# Patient Record
Sex: Male | Born: 1950 | Race: Black or African American | Hispanic: No | Marital: Single | Smoking: Former smoker
Health system: Southern US, Community
[De-identification: ages and names within clinical notes are randomized; demographics above are authoritative.]

## PROBLEM LIST (undated history)

## (undated) DIAGNOSIS — Z9889 Other specified postprocedural states: Secondary | ICD-10-CM

## (undated) DIAGNOSIS — I959 Hypotension, unspecified: Secondary | ICD-10-CM

## (undated) DIAGNOSIS — N186 End stage renal disease: Secondary | ICD-10-CM

## (undated) DIAGNOSIS — I4891 Unspecified atrial fibrillation: Secondary | ICD-10-CM

## (undated) DIAGNOSIS — R609 Edema, unspecified: Secondary | ICD-10-CM

## (undated) HISTORY — DX: Other specified postprocedural states: Z98.890

## (undated) HISTORY — DX: Hypotension, unspecified: I95.9

## (undated) HISTORY — DX: End stage renal disease: N18.6

## (undated) HISTORY — DX: Unspecified atrial fibrillation: I48.91

## (undated) HISTORY — DX: Edema, unspecified: R60.9

---

## 2011-05-29 DIAGNOSIS — B1920 Unspecified viral hepatitis C without hepatic coma: Secondary | ICD-10-CM | POA: Insufficient documentation

## 2011-05-29 DIAGNOSIS — N179 Acute kidney failure, unspecified: Secondary | ICD-10-CM | POA: Insufficient documentation

## 2011-05-29 DIAGNOSIS — F1193 Opioid use, unspecified with withdrawal: Secondary | ICD-10-CM | POA: Insufficient documentation

## 2011-06-01 DIAGNOSIS — N401 Enlarged prostate with lower urinary tract symptoms: Secondary | ICD-10-CM | POA: Insufficient documentation

## 2012-04-30 DIAGNOSIS — R252 Cramp and spasm: Secondary | ICD-10-CM | POA: Insufficient documentation

## 2020-01-07 DIAGNOSIS — F1911 Other psychoactive substance abuse, in remission: Secondary | ICD-10-CM | POA: Insufficient documentation

## 2020-01-07 DIAGNOSIS — Z720 Tobacco use: Secondary | ICD-10-CM | POA: Insufficient documentation

## 2020-10-15 DIAGNOSIS — K921 Melena: Secondary | ICD-10-CM | POA: Insufficient documentation

## 2020-11-28 DIAGNOSIS — N186 End stage renal disease: Secondary | ICD-10-CM | POA: Insufficient documentation

## 2020-12-27 DIAGNOSIS — A419 Sepsis, unspecified organism: Secondary | ICD-10-CM | POA: Insufficient documentation

## 2021-01-07 DIAGNOSIS — B9689 Other specified bacterial agents as the cause of diseases classified elsewhere: Secondary | ICD-10-CM | POA: Insufficient documentation

## 2021-01-07 DIAGNOSIS — J9601 Acute respiratory failure with hypoxia: Secondary | ICD-10-CM | POA: Insufficient documentation

## 2021-01-07 DIAGNOSIS — L89210 Pressure ulcer of right hip, unstageable: Secondary | ICD-10-CM | POA: Insufficient documentation

## 2021-01-07 DIAGNOSIS — G9341 Metabolic encephalopathy: Secondary | ICD-10-CM | POA: Insufficient documentation

## 2021-03-12 DIAGNOSIS — D509 Iron deficiency anemia, unspecified: Secondary | ICD-10-CM | POA: Insufficient documentation

## 2021-03-12 DIAGNOSIS — T782XXA Anaphylactic shock, unspecified, initial encounter: Secondary | ICD-10-CM | POA: Insufficient documentation

## 2021-03-12 DIAGNOSIS — N2581 Secondary hyperparathyroidism of renal origin: Secondary | ICD-10-CM | POA: Insufficient documentation

## 2021-03-12 DIAGNOSIS — D631 Anemia in chronic kidney disease: Secondary | ICD-10-CM | POA: Insufficient documentation

## 2021-03-12 DIAGNOSIS — R0602 Shortness of breath: Secondary | ICD-10-CM

## 2021-03-12 DIAGNOSIS — E1129 Type 2 diabetes mellitus with other diabetic kidney complication: Secondary | ICD-10-CM | POA: Insufficient documentation

## 2021-03-12 DIAGNOSIS — Z111 Encounter for screening for respiratory tuberculosis: Secondary | ICD-10-CM | POA: Insufficient documentation

## 2021-03-12 DIAGNOSIS — D688 Other specified coagulation defects: Secondary | ICD-10-CM | POA: Insufficient documentation

## 2021-03-12 DIAGNOSIS — T7840XA Allergy, unspecified, initial encounter: Secondary | ICD-10-CM | POA: Insufficient documentation

## 2021-03-12 DIAGNOSIS — E1151 Type 2 diabetes mellitus with diabetic peripheral angiopathy without gangrene: Secondary | ICD-10-CM | POA: Insufficient documentation

## 2021-03-12 HISTORY — DX: Shortness of breath: R06.02

## 2021-04-12 ENCOUNTER — Other Ambulatory Visit: Payer: Self-pay

## 2021-04-12 DIAGNOSIS — N189 Chronic kidney disease, unspecified: Secondary | ICD-10-CM

## 2021-04-19 ENCOUNTER — Other Ambulatory Visit: Payer: Self-pay

## 2021-04-19 ENCOUNTER — Encounter (HOSPITAL_BASED_OUTPATIENT_CLINIC_OR_DEPARTMENT_OTHER): Payer: Medicare (Managed Care) | Attending: Internal Medicine | Admitting: Internal Medicine

## 2021-04-19 DIAGNOSIS — Z992 Dependence on renal dialysis: Secondary | ICD-10-CM | POA: Diagnosis not present

## 2021-04-19 DIAGNOSIS — N186 End stage renal disease: Secondary | ICD-10-CM | POA: Insufficient documentation

## 2021-04-19 DIAGNOSIS — L89313 Pressure ulcer of right buttock, stage 3: Secondary | ICD-10-CM | POA: Diagnosis not present

## 2021-04-19 DIAGNOSIS — L89892 Pressure ulcer of other site, stage 2: Secondary | ICD-10-CM | POA: Diagnosis present

## 2021-04-19 DIAGNOSIS — L89153 Pressure ulcer of sacral region, stage 3: Secondary | ICD-10-CM | POA: Insufficient documentation

## 2021-04-19 NOTE — Progress Notes (Signed)
MUNACHIMSO, PALIN ([id number]) Visit Report for 04/19/2021 Allergy List Details Patient Name: Date of Service: [name] ID 04/19/2021 1:15 PM Medical Record Number: [id number] Patient Account Number: [id number] Date of Birth/Sex: Treating RN: 16-Sep-1950 (71 y.o. [name] Primary Care Dashan Chizmar: Other Clinician: Referring Amiir Heckard: Treating Araiya Tilmon/Extender: Joanie Coddington in Treatment: 0 Allergies Active Allergies No Known Allergies Allergy Notes Electronic Signature(s) Signed: 04/19/2021 5:28:28 PM By: Dellie Catholic RN Entered By: Dellie Catholic on 04/19/2021 13:37:06 -------------------------------------------------------------------------------- Arrival Information Details Patient Name: Date of Service: [name], [name] ID 04/19/2021 1:15 PM Medical Record Number: [id number] Patient Account Number: [id number] Date of Birth/Sex: Treating RN: June 02, 1950 (71 y.o. [name] Primary Care Sharonda Llamas: Other Clinician: Referring Zaiah Eckerson: Treating Yassmin Binegar/Extender: Joanie Coddington in Treatment: 0 Visit Information Patient Arrived: Wheel Chair Arrival Time: 13:30 Accompanied By: daughter Transfer Assistance: Civil Service fast streamer Patient Identification Verified: Yes Secondary Verification Process Completed: Yes Patient Requires Transmission-Based Precautions: No Patient Has Alerts: No Electronic Signature(s) Signed: 04/19/2021 5:28:28 PM By: Dellie Catholic RN Entered By: Dellie Catholic on 04/19/2021 13:35:33 -------------------------------------------------------------------------------- Clinic Level of Care Assessment Details Patient Name: Date of Service: [name] ID 04/19/2021 1:15 PM Medical Record Number: [id number] Patient Account Number: [id number] Date of Birth/Sex: Treating RN: Jul 28, 1950 (71 y.o. [name] Primary Care Medora Roorda: Other Clinician: Referring Lynley Killilea: Treating Nicky Milhouse/Extender: Joanie Coddington in Treatment: 0 Clinic Level of Care Assessment Items TOOL 1 Quantity Score X- 1 0 Use when EandM and Procedure is performed on INITIAL visit ASSESSMENTS - Nursing Assessment / Reassessment X- 1 20 General Physical Exam (combine w/ comprehensive assessment (listed just below) when performed on new pt. evals) X- 1 25 Comprehensive Assessment (HX, ROS, Risk Assessments, Wounds Hx, etc.) ASSESSMENTS - Wound and Skin Assessment / Reassessment X- 1 10 Dermatologic / Skin Assessment (not related to wound area) ASSESSMENTS - Ostomy and/or Continence Assessment and Care []  - 0 Incontinence Assessment and Management []  - 0 Ostomy Care Assessment and Management (repouching, etc.) PROCESS - Coordination of Care []  - 0 Simple Patient / Family Education for ongoing care X- 1 20 Complex (extensive) Patient / Family Education for ongoing care X- 1 10 Staff obtains Programmer, systems, Records, T Results / Process Orders est []  - 0 Staff telephones HHA, Nursing Homes / Clarify orders / etc []  - 0 Routine Transfer to another Facility (non-emergent condition) []  - 0 Routine Hospital Admission (non-emergent condition) X- 1 15 New Admissions / Biomedical engineer / Ordering NPWT Apligraf, etc. , []  - 0 Emergency Hospital Admission (emergent condition) PROCESS - Special Needs []  - 0 Pediatric / Minor Patient Management []  - 0 Isolation Patient Management []  - 0 Hearing / Language / Visual special needs []  - 0 Assessment of Community assistance (transportation, D/C planning, etc.) []  - 0 Additional assistance / Altered mentation X- 1 15 Support Surface(s) Assessment (bed, cushion, seat, etc.) INTERVENTIONS - Miscellaneous []  - 0 External ear exam X- 1 10 Patient Transfer (multiple staff / Civil Service fast streamer / Similar devices) []  - 0 Simple Staple / Suture removal (25 or less) []  - 0 Complex Staple / Suture removal (26 or more) []  - 0 Hypo/Hyperglycemic  Management (do not check if billed separately) []  - 0 Ankle / Brachial Index (ABI) - do not check if billed separately Has the patient been seen at the hospital within the last three years: Yes Total Score: 125 Level Of Care: New/Established - Level 4 Electronic Signature(s) Signed: 04/19/2021  5:28:28 PM By: Dellie Catholic RN Entered By: Dellie Catholic on 04/19/2021 16:51:25 -------------------------------------------------------------------------------- Encounter Discharge Information Details Patient Name: Date of Service: [name], Shaune Pascal ID 04/19/2021 1:15 PM Medical Record Number: [id number] Patient Account Number: [id number] Date of Birth/Sex: Treating RN: 1950/04/11 (71 y.o. [name] Primary Care Kymoni Monday: Other Clinician: Referring Sire Poet: Treating Annjanette Wertenberger/Extender: Joanie Coddington in Treatment: 0 Encounter Discharge Information Items Post Procedure Vitals Discharge Condition: Stable Temperature (F): 98.3 Ambulatory Status: Wheelchair Pulse (bpm): 78 Discharge Destination: Home Respiratory Rate (breaths/min): 16 Transportation: Private Auto Blood Pressure (mmHg): 114/81 Accompanied By: Daughter Schedule Follow-up Appointment: Yes Clinical Summary of Care: Patient Declined Electronic Signature(s) Signed: 04/19/2021 5:28:28 PM By: Dellie Catholic RN Entered By: Dellie Catholic on 04/19/2021 17:12:08 -------------------------------------------------------------------------------- Lower Extremity Assessment Details Patient Name: Date of Service: [name] ID 04/19/2021 1:15 PM Medical Record Number: [id number] Patient Account Number: [id number] Date of Birth/Sex: Treating RN: 05/29/1950 (71 y.o. [name] Primary Care Meiya Wisler: Other Clinician: Referring Daneesha Quinteros: Treating Keyani Rigdon/Extender: Joanie Coddington in Treatment: 0 Electronic Signature(s) Signed: 04/19/2021 5:28:28 PM By: Dellie Catholic  RN Entered By: Dellie Catholic on 04/19/2021 14:19:23 -------------------------------------------------------------------------------- Multi Wound Chart Details Patient Name: Date of Service: [name], [name] ID 04/19/2021 1:15 PM Medical Record Number: [id number] Patient Account Number: [id number] Date of Birth/Sex: Treating RN: [birth year] (71 y.o. [name] Primary Care Len Azeez: Other Clinician: Referring Detra Bores: Treating Tamrah Victorino/Extender: Joanie Coddington in Treatment: 0 Vital Signs Height(in): 73 Pulse(bpm): 78 Weight(lbs): 147 Blood Pressure(mmHg): 114/81 Body Mass Index(BMI): 19.4 Temperature(F): 98.3 Respiratory Rate(breaths/min): 16 Photos: [2:No Photos] Sacrum Right Ischial Tuberosity Right Ischial Tuberosity Wound Location: Pressure Injury Pressure Injury Pressure Injury Wounding Event: Pressure Ulcer Pressure Ulcer Pressure Ulcer Primary Etiology: Arrhythmia Arrhythmia Arrhythmia Comorbid History: 01/26/2021 01/26/2021 01/26/2021 Date Acquired: 0 0 0 Weeks of Treatment: Open Open Open Wound Status: No No No Wound Recurrence: 2.5x1.6x0.8 11.7x6x0.2 11.7x6x0.2 Measurements L x W x D (cm) 3.142 55.135 55.135 A (cm) : rea 2.513 11.027 11.027 Volume (cm) : 0.00% N/A 0.00% % Reduction in A rea: 0.00% N/A 0.00% % Reduction in Volume: Category/Stage III Category/Stage III Category/Stage III Classification: Medium Medium Medium Exudate A mount: Serosanguineous Serosanguineous Serosanguineous Exudate Type: red, brown red, brown red, brown Exudate Color: Medium (34-66%) Large (67-100%) Large (67-100%) Granulation A mount: N/A Red N/A Granulation Quality: Medium (34-66%) Small (1-33%) Small (1-33%) Necrotic A mount: Fat Layer (Subcutaneous Tissue): Yes Fat Layer (Subcutaneous Tissue): Yes Fat Layer (Subcutaneous Tissue): Yes Exposed Structures: Fascia: No Fascia: No Fascia: No Tendon: No Tendon: No Tendon:  No Muscle: No Muscle: No Muscle: No Joint: No Joint: No Joint: No Bone: No Bone: No Bone: No Small (1-33%) Small (1-33%) N/A Epithelialization: Chemical/Enzymatic/Mechanical - Debridement - Selective/Open Wound Debridement - Selective/Open Wound Debridement: Excisional Pre-procedure Verification/Time Out 15:11 15:11 15:11 Taken: Other Other Other Pain Control: Subcutaneous, [employer] Tissue Debrided: Skin/Subcutaneous Tissue Skin/Epidermis Skin/Epidermis Level: N/A 70.2 70.2 Debridement A (sq cm): rea Curette Curette Curette Instrument: Moderate Moderate Moderate Bleeding: Pressure Pressure Pressure Hemostasis A chieved: 0 0 0 Procedural Pain: 0 0 0 Post Procedural Pain: Procedure was tolerated well Procedure was tolerated well Procedure was tolerated well Debridement Treatment Response: 2.5x1.6x0.8 11.7x6x0.2 11.7x6x0.2 Post Debridement Measurements L x W x D (cm) 2.513 11.027 11.027 Post Debridement Volume: (cm) Category/Stage III Category/Stage III Category/Stage III Post Debridement Stage: N/A Debridement Debridement Procedures Performed: Wound Number: 3 4 N/A Photos: N/A Right Ischium Right, Lateral Knee N/A  Wound Location: Pressure Injury Pressure Injury N/A Wounding Event: Pressure Ulcer Pressure Ulcer N/A Primary Etiology: Arrhythmia Arrhythmia N/A Comorbid History: 01/26/2021 01/26/2021 N/A Date Acquired: 0 0 N/A Weeks of Treatment: Open Open N/A Wound Status: No No N/A Wound Recurrence: 1.8x1.9x1.1 1.2x0.5x0.1 N/A Measurements L x W x D (cm) 2.686 0.471 N/A A (cm) : rea 2.955 0.047 N/A Volume (cm) : 0.00% 0.00% N/A % Reduction in A rea: 0.00% 0.00% N/A % Reduction in Volume: Category/Stage III Category/Stage II N/A Classification: Medium Medium N/A Exudate Amount: Serosanguineous Serosanguineous N/A Exudate Type: red, brown red, brown N/A Exudate Color: Medium (34-66%) Large (67-100%) N/A Granulation  Amount: Pink Red N/A Granulation Quality: Medium (34-66%) Small (1-33%) N/A Necrotic Amount: Fat Layer (Subcutaneous Tissue): Yes Fat Layer (Subcutaneous Tissue): Yes N/A Exposed Structures: Fascia: No Fascia: No Tendon: No Tendon: No Muscle: No Muscle: No Joint: No Joint: No Bone: No Bone: No N/A Small (1-33%) N/A Epithelialization: N/A Chemical/Enzymatic/Mechanical - N/A Debridement: Excisional Pre-procedure Verification/Time Out N/A 15:11 N/A Taken: N/A Other N/A Pain Control: N/A Subcutaneous, Slough N/A Tissue Debrided: N/A Skin/Subcutaneous Tissue N/A Level: N/A N/A N/A Debridement A (sq cm): rea N/A Curette N/A Instrument: N/A Moderate N/A Bleeding: N/A Pressure N/A Hemostasis A chieved: N/A 0 N/A Procedural Pain: N/A 0 N/A Post Procedural Pain: N/A Procedure was tolerated well N/A Debridement Treatment Response: N/A 1.2x0.5x0.1 N/A Post Debridement Measurements L x W x D (cm) N/A 0.047 N/A Post Debridement Volume: (cm) N/A Category/Stage II N/A Post Debridement Stage: N/A N/A N/A Procedures Performed: Treatment Notes Electronic Signature(s) Signed: 04/19/2021 4:11:37 PM By: [name] Signed: 04/19/2021 4:43:36 PM By: [name] DO Entered By: [name] on 04/19/2021 15:47:45 -------------------------------------------------------------------------------- Multi-Disciplinary Care Plan Details Patient Name: Date of Service: [name], [name] ID 04/19/2021 1:15 PM Medical Record Number: [id number] Patient Account Number: [id number] Date of Birth/Sex: Treating RN: [birth year] (71 y.o. [name] Primary Care Kiandre Spagnolo: Other Clinician: Referring Sheli Dorin: Treating Aaronmichael Brumbaugh/Extender: Joanie Coddington in Treatment: Dell reviewed with physician Active Inactive Orientation to the Wound Care Program Nursing Diagnoses: Knowledge deficit related to the wound healing center  program Goals: Patient/caregiver will verbalize understanding of the [facility] Date Initiated: 04/19/2021 Target Resolution Date: 05/17/2021 Goal Status: Active Interventions: Provide education on orientation to the wound center Notes: Wound/Skin Impairment Nursing Diagnoses: Impaired tissue integrity Goals: Patient will have a decrease in wound volume by X% from date: (specify in notes) Date Initiated: 04/19/2021 Target Resolution Date: 05/17/2021 Goal Status: Active Interventions: Assess patient/caregiver ability to perform ulcer/skin care regimen upon admission and as needed Notes: Electronic Signature(s) Signed: 04/19/2021 5:28:28 PM By: Dellie Catholic RN Entered By: Dellie Catholic on 04/19/2021 14:58:11 -------------------------------------------------------------------------------- Pain Assessment Details Patient Name: Date of Service: [name] ID 04/19/2021 1:15 PM Medical Record Number: [id number] Patient Account Number: [id number] Date of Birth/Sex: Treating RN: 1950/05/06 (71 y.o. [name] Primary Care Esaiah Wanless: Other Clinician: Referring Stacia Feazell: Treating Darby Shadwick/Extender: Joanie Coddington in Treatment: 0 Active Problems Location of Pain Severity and Description of Pain Patient Has Paino No Site Locations Pain Management and Medication Current Pain Management: Electronic Signature(s) Signed: 04/19/2021 5:28:28 PM By: Dellie Catholic RN Entered By: Dellie Catholic on 04/19/2021 14:55:54 -------------------------------------------------------------------------------- Patient/Caregiver Education Details Patient Name: Date of Service: [name] ID 1/30/2023andnbsp1:15 PM Medical Record Number: [id number] Patient Account Number: [id number] Date of Birth/Gender: Treating RN: 15-Aug-1950 (71 y.o. [name] Primary Care Physician: Other Clinician: Referring Physician:  Treating Physician/Extender:  Joanie Coddington in Treatment: 0 Education Assessment Education Provided To: Patient Education Topics Provided [place]: o Handouts: Welcome T [facility] o Methods: Explain/Verbal, Printed Responses: Return demonstration correctly Electronic Signature(s) Signed: 04/19/2021 5:28:28 PM By: Dellie Catholic RN Entered By: Dellie Catholic on 04/19/2021 15:04:17 -------------------------------------------------------------------------------- Wound Assessment Details Patient Name: Date of Service: [name] ID 04/19/2021 1:15 PM Medical Record Number: [id number] Patient Account Number: [id number] Date of Birth/Sex: Treating RN: [birth year] (71 y.o. [name] Primary Care Inetta Dicke: Other Clinician: Referring Mykela Mewborn: Treating Rishit Burkhalter/Extender: Joanie Coddington in Treatment: 0 Wound Status Wound Number: 1 Primary Etiology: Pressure Ulcer Wound Location: Sacrum Wound Status: Open Wounding Event: Pressure Injury Comorbid History: Arrhythmia Date Acquired: 01/26/2021 Weeks Of Treatment: 0 Clustered Wound: No Photos Wound Measurements Length: (cm) 2.5 Width: (cm) 1.6 Depth: (cm) 0.8 Area: (cm) 3.142 Volume: (cm) 2.513 % Reduction in Area: 0% % Reduction in Volume: 0% Epithelialization: Small (1-33%) Tunneling: No Undermining: No Wound Description Classification: Category/Stage III Exudate Amount: Medium Exudate Type: Serosanguineous Exudate Color: red, brown Foul Odor After Cleansing: No Slough/Fibrino Yes Wound Bed Granulation Amount: Medium (34-66%) Exposed Structure Necrotic Amount: Medium (34-66%) Fascia Exposed: No Necrotic Quality: Adherent Slough Fat Layer (Subcutaneous Tissue) Exposed: Yes Tendon Exposed: No Muscle Exposed: No Joint Exposed: No Bone Exposed: No Treatment Notes Wound #1 (Sacrum) Cleanser Wound Cleanser Discharge Instruction: Cleanse the wound  with wound cleanser prior to applying a clean dressing using gauze sponges, not tissue or cotton balls. Peri-Wound Care Topical Primary Dressing Dakin's Solution 0.125%, 16 (oz) Discharge Instruction: Moisten gauze with Dakin's solution Secondary Dressing Bordered Gauze, 2x2 in Discharge Instruction: Apply over primary dressing as directed. Secured With Compression Wrap Compression Stockings Environmental education officer) Signed: 04/19/2021 5:28:28 PM By: Dellie Catholic RN Entered By: Dellie Catholic on 04/19/2021 14:39:39 -------------------------------------------------------------------------------- Wound Assessment Details Patient Name: Date of Service: [name] ID 04/19/2021 1:15 PM Medical Record Number: [id number] Patient Account Number: [id number] Date of Birth/Sex: Treating RN: [birth year] (71 y.o. [name] Primary Care Kirke Breach: Other Clinician: Referring Torian Thoennes: Treating Coy Rochford/Extender: Joanie Coddington in Treatment: 0 Wound Status Wound Number: 2 Primary Etiology: Pressure Ulcer Wound Location: Right Ischial Tuberosity Wound Status: Open Wounding Event: Pressure Injury Comorbid History: Arrhythmia Date Acquired: 01/26/2021 Weeks Of Treatment: 0 Clustered Wound: No Photos Wound Measurements Length: (cm) 11.7 Width: (cm) 6 Depth: (cm) 0.2 Area: (cm) 55.135 Volume: (cm) 11.027 % Reduction in Area: 0% % Reduction in Volume: 0% Wound Description Classification: Category/Stage III Exudate Amount: Medium Exudate Type: Serosanguineous Exudate Color: red, brown Foul Odor After Cleansing: No Slough/Fibrino Yes Wound Bed Granulation Amount: Large (67-100%) Exposed Structure Necrotic Amount: Small (1-33%) Fascia Exposed: No Necrotic Quality: Adherent Slough Fat Layer (Subcutaneous Tissue) Exposed: Yes Tendon Exposed: No Muscle Exposed: No Joint Exposed: No Bone Exposed: No Electronic Signature(s) Signed:  04/19/2021 5:28:28 PM By: Dellie Catholic RN Entered By: Dellie Catholic on 04/19/2021 15:01:33 -------------------------------------------------------------------------------- Wound Assessment Details Patient Name: Date of Service: [name] ID 04/19/2021 1:15 PM Medical Record Number: [id number] Patient Account Number: [id number] Date of Birth/Sex: Treating RN: June 01, 1950 (71 y.o. [name] Primary Care Jeena Arnett: Other Clinician: Referring Tamon Parkerson: Treating Zelig Gacek/Extender: Joanie Coddington in Treatment: 0 Wound Status Wound Number: 2 Primary Etiology: Pressure Ulcer Wound Location: Right Ischial Tuberosity Wound Status: Open Wounding Event: Pressure Injury Comorbid History: Arrhythmia Date Acquired: 01/26/2021 Weeks Of Treatment: 0  Clustered Wound: No Photos Wound Measurements Length: (cm) 11.7 Width: (cm) 6 Depth: (cm) 0.2 Area: (cm) 55.135 Volume: (cm) 11.027 % Reduction in Area: 0% % Reduction in Volume: 0% Epithelialization: None Tunneling: No Undermining: No Wound Description Classification: Category/Stage III Exudate Amount: Medium Exudate Type: Serosanguineous Exudate Color: red, brown Foul Odor After Cleansing: No Slough/Fibrino Yes Wound Bed Granulation Amount: Large (67-100%) Exposed Structure Granulation Quality: Red Fascia Exposed: No Necrotic Amount: Small (1-33%) Fat Layer (Subcutaneous Tissue) Exposed: Yes Necrotic Quality: Adherent Slough Tendon Exposed: No Muscle Exposed: No Joint Exposed: No Bone Exposed: No Treatment Notes Wound #2 (Ischial Tuberosity) Wound Laterality: Right Cleanser Wound Cleanser Discharge Instruction: Cleanse the wound with wound cleanser prior to applying a clean dressing using gauze sponges, not tissue or cotton balls. Peri-Wound Care Topical Primary Dressing Hydrofera Blue Ready Foam, 4x5 in Discharge Instruction: Apply to wound bed as instructed Secondary  Dressing Woven Gauze Sponge, Non-Sterile 4x4 in Discharge Instruction: Apply over primary dressing as directed. ABD Pad, 8x10 Discharge Instruction: Apply over primary dressing as directed. Secured With 71M Medipore H Soft Cloth Surgical T ape, 4 x 10 (in/yd) Discharge Instruction: Secure with tape as directed. Compression Wrap Compression Stockings Add-Ons Electronic Signature(s) Signed: 04/19/2021 5:28:28 PM By: Dellie Catholic RN Entered By: Dellie Catholic on 04/19/2021 16:59:25 -------------------------------------------------------------------------------- Wound Assessment Details Patient Name: Date of Service: [name] ID 04/19/2021 1:15 PM Medical Record Number: [id number] Patient Account Number: [id number] Date of Birth/Sex: Treating RN: 1950-10-06 (71 y.o. [name] Primary Care Syble Picco: Other Clinician: Referring Jazmyne Beauchesne: Treating Aleksey Newbern/Extender: Joanie Coddington in Treatment: 0 Wound Status Wound Number: 3 Primary Etiology: Pressure Ulcer Wound Location: Right Gluteus Wound Status: Open Wounding Event: Pressure Injury Comorbid History: Arrhythmia Date Acquired: 01/26/2021 Weeks Of Treatment: 0 Clustered Wound: No Photos Wound Measurements Length: (cm) 1.8 Width: (cm) 1.9 Depth: (cm) 1.1 Area: (cm) 2.686 Volume: (cm) 2.955 % Reduction in Area: 0% % Reduction in Volume: 0% Epithelialization: None Tunneling: No Undermining: No Wound Description Classification: Category/Stage III Exudate Amount: Medium Exudate Type: Serosanguineous Exudate Color: red, brown Foul Odor After Cleansing: No Slough/Fibrino Yes Wound Bed Granulation Amount: Medium (34-66%) Exposed Structure Granulation Quality: Pink Fascia Exposed: No Necrotic Amount: Medium (34-66%) Fat Layer (Subcutaneous Tissue) Exposed: Yes Tendon Exposed: No Muscle Exposed: No Joint Exposed: No Bone Exposed: No Treatment Notes Wound #3 (Gluteus) Wound  Laterality: Right Cleanser Wound Cleanser Discharge Instruction: Cleanse the wound with wound cleanser prior to applying a clean dressing using gauze sponges, not tissue or cotton balls. Peri-Wound Care Topical Primary Dressing Dakin's Solution 0.125%, 16 (oz) Discharge Instruction: Moisten gauze with Dakin's solution Secondary Dressing Bordered Gauze, 2x2 in Discharge Instruction: Apply over primary dressing as directed. Secured With Compression Wrap Compression Stockings Environmental education officer) Signed: 04/19/2021 5:28:28 PM By: Dellie Catholic RN Entered By: Dellie Catholic on 04/19/2021 17:00:41 -------------------------------------------------------------------------------- Wound Assessment Details Patient Name: Date of Service: [name] ID 04/19/2021 1:15 PM Medical Record Number: [id number] Patient Account Number: [id number] Date of Birth/Sex: Treating RN: [birth year] (71 y.o. [name] Primary Care Jaycen Vercher: Other Clinician: Referring Cleotilde Spadaccini: Treating Tkeya Stencil/Extender: Joanie Coddington in Treatment: 0 Wound Status Wound Number: 4 Primary Etiology: Pressure Ulcer Wound Location: Right, Lateral Knee Wound Status: Open Wounding Event: Pressure Injury Comorbid History: Arrhythmia Date Acquired: 01/26/2021 Weeks Of Treatment: 0 Clustered Wound: No Photos Wound Measurements Length: (cm) 1.2 Width: (cm) 0.5 Depth: (cm) 0.1 Area: (cm) 0.471 Volume: (cm) 0.047 % Reduction in Area: 0% %  Reduction in Volume: 0% Epithelialization: Small (1-33%) Tunneling: No Undermining: No Wound Description Classification: Category/Stage II Exudate Amount: Medium Exudate Type: Serosanguineous Exudate Color: red, brown Foul Odor After Cleansing: No Slough/Fibrino Yes Wound Bed Granulation Amount: Large (67-100%) Exposed Structure Granulation Quality: Red Fascia Exposed: No Necrotic Amount: Small (1-33%) Fat Layer (Subcutaneous  Tissue) Exposed: Yes Necrotic Quality: Adherent Slough Tendon Exposed: No Muscle Exposed: No Joint Exposed: No Bone Exposed: No Treatment Notes Wound #4 (Knee) Wound Laterality: Right, Lateral Cleanser Wound Cleanser Discharge Instruction: Cleanse the wound with wound cleanser prior to applying a clean dressing using gauze sponges, not tissue or cotton balls. Peri-Wound Care Topical Primary Dressing Hydrofera Blue Ready Foam, 2.5 x2.5 in Discharge Instruction: Apply to wound bed as instructed Secondary Dressing Bordered Gauze, 4x4 in Discharge Instruction: Apply over primary dressing as directed. Secured With Compression Wrap Compression Stockings Environmental education officer) Signed: 04/19/2021 5:28:28 PM By: Dellie Catholic RN Entered By: Dellie Catholic on 04/19/2021 14:34:55 -------------------------------------------------------------------------------- Vitals Details Patient Name: Date of Service: [name], [name] ID 04/19/2021 1:15 PM Medical Record Number: [id number] Patient Account Number: [id number] Date of Birth/Sex: Treating RN: 03-Jul-1950 (71 y.o. [name] Primary Care Rovena Hearld: Other Clinician: Referring Briona Korpela: Treating Parv Manthey/Extender: Joanie Coddington in Treatment: 0 Vital Signs Time Taken: 13:35 Temperature (F): 98.3 Height (in): 73 Pulse (bpm): 78 Source: Stated Respiratory Rate (breaths/min): 16 Weight (lbs): 147 Blood Pressure (mmHg): 114/81 Source: Stated Reference Range: 80 - 120 mg / dl Body Mass Index (BMI): 19.4 Electronic Signature(s) Signed: 04/19/2021 5:28:28 PM By: Dellie Catholic RN Entered By: Dellie Catholic on 04/19/2021 13:36:22

## 2021-04-19 NOTE — Progress Notes (Signed)
AVARI, GELLES ([id number]) Visit Report for 04/19/2021 Abuse Risk Screen Details Patient Name: Date of Service: [name] ID 04/19/2021 1:15 PM Medical Record Number: [id number] Patient Account Number: [id number] Date of Birth/Sex: Treating RN: 12-28-1950 (71 y.o. [name] Primary Care Terrion Poblano: Other Clinician: Referring Brighton Pilley: Treating Caniyah Murley/Extender: Joanie Coddington in Treatment: 0 Abuse Risk Screen Items Answer ABUSE RISK SCREEN: Has anyone close to you tried to hurt or harm you recentlyo No Do you feel uncomfortable with anyone in your familyo No Has anyone forced you do things that you didnt want to doo No Electronic Signature(s) Signed: 04/19/2021 5:28:28 PM By: Dellie Catholic RN Entered By: Dellie Catholic on 04/19/2021 13:46:30 -------------------------------------------------------------------------------- Activities of Daily Living Details Patient Name: Date of Service: [name] ID 04/19/2021 1:15 PM Medical Record Number: [id number] Patient Account Number: [id number] Date of Birth/Sex: Treating RN: 1950-07-13 (71 y.o. [name] Primary Care Makyla Bye: Other Clinician: Referring Sundus Pete: Treating Lenni Reckner/Extender: Joanie Coddington in Treatment: 0 Activities of Daily Living Items Answer Activities of Daily Living (Please select one for each item) Drive Automobile Not Able T Medications ake Completely Able Use T elephone Completely Able Care for Appearance Not Able Use T oilet Not Able Manus Rudd / Shower Not Able Dress Self Not Able Feed Self Completely Able Walk Not Able Get In / Out Bed Not [place] for Self Not Able Electronic Signature(s) Signed: 04/19/2021 5:28:28 PM By: Dellie Catholic RN Entered By: Dellie Catholic on 04/19/2021  13:47:51 -------------------------------------------------------------------------------- Education Screening Details Patient Name: Date of Service: [name], [name] ID 04/19/2021 1:15 PM Medical Record Number: [id number] Patient Account Number: [id number] Date of Birth/Sex: Treating RN: 1950-11-07 (71 y.o. [name] Primary Care Audry Pecina: Other Clinician: Referring Giannie Soliday: Treating Cadynce Garrette/Extender: Joanie Coddington in Treatment: 0 Primary Learner Assessed: Patient Learning Preferences/Education Level/Primary Language Learning Preference: Explanation, Demonstration, Printed Material Highest Education Level: College or Above Preferred Language: English Cognitive Barrier Language Barrier: No Translator Needed: No Memory Deficit: No Emotional Barrier: No Cultural/Religious Beliefs Affecting Medical Care: No Physical Barrier Impaired Vision: No Impaired Hearing: No Decreased Hand dexterity: No Knowledge/Comprehension Knowledge Level: High Comprehension Level: High Ability to understand written instructions: High Ability to understand verbal instructions: High Motivation Anxiety Level: Calm Cooperation: Cooperative Education Importance: Acknowledges Need Interest in Health Problems: Asks Questions Perception: Coherent Willingness to Engage in Self-Management High Activities: Readiness to Engage in Self-Management High Activities: Electronic Signature(s) Signed: 04/19/2021 5:28:28 PM By: Dellie Catholic RN Entered By: Dellie Catholic on 04/19/2021 13:49:25 -------------------------------------------------------------------------------- Fall Risk Assessment Details Patient Name: Date of Service: [name], [name] 04/19/2021 1:15 PM Medical Record Number: [id number] Patient Account Number: [id number] Date of Birth/Sex: Treating RN: [birth year] (71 y.o. [name] Primary Care Jennfer Gassen: Other Clinician: Referring Landrie Beale: Treating  Jerren Flinchbaugh/Extender: Joanie Coddington in Treatment: 0 Fall Risk Assessment Items Have you had 2 or more falls in the last 12 monthso 0 Yes Have you had any fall that resulted in injury in the last 12 monthso 0 Yes FALLS RISK SCREEN History of falling - immediate or within 3 months 25 Yes Secondary diagnosis (Do you have 2 or more medical diagnoseso) 15 Yes Ambulatory aid None/bed rest/wheelchair/nurse 0 Yes Crutches/cane/walker 0 No Furniture 0 No Intravenous therapy Access/Saline/Heparin Lock 0 No Gait/Transferring Normal/ bed rest/ wheelchair 0 Yes Weak (short steps with or without shuffle, stooped but able to  lift head while walking, may seek 0 No support from furniture) Impaired (short steps with shuffle, may have difficulty arising from chair, head down, impaired 20 Yes balance) Mental Status Oriented to own ability 0 No Electronic Signature(s) Signed: 04/19/2021 5:28:28 PM By: Dellie Catholic RN Entered By: Dellie Catholic on 04/19/2021 13:51:40 -------------------------------------------------------------------------------- Foot Assessment Details Patient Name: Date of Service: [name] ID 04/19/2021 1:15 PM Medical Record Number: [id number] Patient Account Number: [id number] Date of Birth/Sex: Treating RN: 1950-07-09 (71 y.o. [name] Primary Care [name]: Other Clinician: Referring Traeger Sultana: Treating Joshawa Dubin/Extender: Joanie Coddington in Treatment: 0 Foot Assessment Items Site Locations + = Sensation present, - = Sensation absent, C = Callus, U = Ulcer R = Redness, W = Warmth, M = Maceration, PU = Pre-ulcerative lesion F = Fissure, S = Swelling, D = Dryness Assessment Right: Left: Other Deformity: No No Prior Foot Ulcer: No No Prior Amputation: No No Charcot Joint: No No Ambulatory Status: Non-ambulatory Assistance Device: Stretcher Gait: Administrator, arts) Signed: 04/19/2021 5:28:28  PM By: Dellie Catholic RN Entered By: Dellie Catholic on 04/19/2021 14:18:54 -------------------------------------------------------------------------------- Nutrition Risk Screening Details Patient Name: Date of Service: [name] ID 04/19/2021 1:15 PM Medical Record Number: [id number] Patient Account Number: [id number] Date of Birth/Sex: Treating RN: 1951/03/15 (71 y.o. [name] Primary Care Sahira Cataldi: Other Clinician: Referring Keyshun Elpers: Treating Saurav Crumble/Extender: Joanie Coddington in Treatment: 0 Height (in): 73 Weight (lbs): 147 Body Mass Index (BMI): 19.4 Nutrition Risk Screening Items Score Screening NUTRITION RISK SCREEN: I have an illness or condition that made me change the kind and/or amount of food I eat 0 No I eat fewer than two meals per day 3 Yes I eat few fruits and vegetables, or milk products 0 No I have three or more drinks of beer, liquor or wine almost every day 0 No I have tooth or mouth problems that make it hard for me to eat 0 No I don't always have enough money to buy the food I need 0 No I eat alone most of the time 0 No I take three or more different prescribed or over-the-counter drugs a day 0 No Without wanting to, I have lost or gained 10 pounds in the last six months 2 Yes I am not always physically able to shop, cook and/or feed myself 0 No Nutrition Protocols Good Risk Protocol Moderate Risk Protocol 0 Provide education on nutrition High Risk Proctocol Risk Level: Moderate Risk Score: 5 Notes Patient is on dialysis Electronic Signature(s) Signed: 04/19/2021 5:28:28 PM By: Dellie Catholic RN Entered By: Dellie Catholic on 04/19/2021 13:53:00

## 2021-04-20 NOTE — Progress Notes (Signed)
[name] ([id number]) Visit Report for 04/19/2021 Chief Complaint Document Details Patient Name: Date of Service: [name] ID 04/19/2021 1:15 PM Medical Record Number: [id number] Patient Account Number: [id number] Date of Birth/Sex: Treating RN: 09/29/1950 (71 y.o. [name] Primary Care Provider: Other Clinician: Referring Provider: Treating Provider/Extender: Joanie Coddington in Treatment: 0 Information Obtained from: Patient Chief Complaint Multiple pressure ulcers to the sacrum, buttocks and knee Electronic Signature(s) Signed: 04/19/2021 4:43:36 PM By: [name] DO Entered By: [name] on 04/19/2021 15:48:13 -------------------------------------------------------------------------------- Debridement Details Patient Name: Date of Service: [name], [name] 04/19/2021 1:15 PM Medical Record Number: [id number] Patient Account Number: [id number] Date of Birth/Sex: Treating RN: 1950-09-24 (71 y.o. [name] Primary Care Provider: Other Clinician: Referring Provider: Treating Provider/Extender: Joanie Coddington in Treatment: 0 Debridement Performed for Assessment: Wound #2 Right Ischial Tuberosity Performed By: Physician [name], DO Debridement Type: Debridement Level of Consciousness (Pre-procedure): Awake and Alert Pre-procedure Verification/Time Out Yes - 15:11 Taken: Start Time: 15:11 Pain Control: Other : Benzocaine 20% T Area Debrided (L x W): otal 11.7 (cm) x 6 (cm) = 70.2 (cm) Tissue and other material debrided: Viable, Non-Viable, Slough, Subcutaneous, Skin: Dermis , Slough Level: Skin/Subcutaneous Tissue Debridement Description: Excisional Instrument: Curette Bleeding: Moderate Hemostasis Achieved: Pressure End Time: 15:13 Procedural Pain: 0 Post Procedural Pain: 0 Response to Treatment: Procedure was tolerated well Level of Consciousness (Post- Awake and  Alert procedure): Post Debridement Measurements of Total Wound Length: (cm) 11.7 Stage: Category/Stage III Width: (cm) 6 Depth: (cm) 0.2 Volume: (cm) 11.027 Character of Wound/Ulcer Post Debridement: Improved Post Procedure Diagnosis Same as Pre-procedure Electronic Signature(s) Signed: 04/19/2021 4:43:36 PM By: [name] DO Signed: 04/19/2021 5:28:28 PM By: Dellie Catholic RN Entered By: Dellie Catholic on 04/19/2021 16:40:06 -------------------------------------------------------------------------------- Debridement Details Patient Name: Date of Service: [name], [name] 04/19/2021 1:15 PM Medical Record Number: [id number] Patient Account Number: [id number] Date of Birth/Sex: Treating RN: Aug 07, 1950 (71 y.o. [name] Primary Care Provider: Other Clinician: Referring Provider: Treating Provider/Extender: Joanie Coddington in Treatment: 0 Debridement Performed for Assessment: Wound #1 Sacrum Performed By: Physician [name], DO Debridement Type: Chemical/Enzymatic/Mechanical Agent Used: Level of Consciousness (Pre-procedure): Awake and Alert Pre-procedure Verification/Time Out Yes - 15:11 Taken: Start Time: 15:11 Pain Control: Other : Benzocaine 20% Instrument: Curette Bleeding: Moderate Hemostasis Achieved: Pressure End Time: 15:13 Procedural Pain: 0 Post Procedural Pain: 0 Response to Treatment: Procedure was tolerated well Level of Consciousness (Post- Awake and Alert procedure): Post Debridement Measurements of Total Wound Length: (cm) 2.5 Stage: Category/Stage III Width: (cm) 1.6 Depth: (cm) 0.8 Volume: (cm) 2.513 Character of Wound/Ulcer Post Debridement: Improved Post Procedure Diagnosis Same as Pre-procedure Electronic Signature(s) Signed: 04/19/2021 4:43:36 PM By: [name] DO Signed: 04/19/2021 5:28:28 PM By: Dellie Catholic RN Entered By: Dellie Catholic on 04/19/2021  16:41:06 -------------------------------------------------------------------------------- Debridement Details Patient Name: Date of Service: [name], [name] 04/19/2021 1:15 PM Medical Record Number: [id number] Patient Account Number: [id number] Date of Birth/Sex: Treating RN: [birth year] (71 y.o. [name] Primary Care Provider: Other Clinician: Referring Provider: Treating Provider/Extender: Joanie Coddington in Treatment: 0 Debridement Performed for Assessment: Wound #3 Right Gluteus Performed By: Physician [name], DO Debridement Type: Chemical/Enzymatic/Mechanical Agent Used: Level of Consciousness (Pre-procedure): Awake and Alert Pre-procedure Verification/Time Out Yes - 15:11 Taken: Start Time: 15:11 Pain Control: Other : Benzocaine 20% Instrument: Curette Bleeding: Moderate Hemostasis Achieved: Pressure End Time: 15:13 Procedural Pain:  0 Post Procedural Pain: 0 Response to Treatment: Procedure was tolerated well Level of Consciousness (Post- Awake and Alert procedure): Post Debridement Measurements of Total Wound Length: (cm) 1.8 Stage: Category/Stage III Width: (cm) 1.9 Depth: (cm) 1.1 Volume: (cm) 2.955 Character of Wound/Ulcer Post Debridement: Improved Post Procedure Diagnosis Same as Pre-procedure Electronic Signature(s) Signed: 04/19/2021 4:43:36 PM By: [name] DO Signed: 04/19/2021 5:28:28 PM By: Dellie Catholic RN Entered By: Dellie Catholic on 04/19/2021 16:41:34 -------------------------------------------------------------------------------- Debridement Details Patient Name: Date of Service: [name], [name] 04/19/2021 1:15 PM Medical Record Number: [id number] Patient Account Number: [id number] Date of Birth/Sex: Treating RN: Apr 13, 1950 (71 y.o. [name] Primary Care Provider: Other Clinician: Referring Provider: Treating Provider/Extender: Joanie Coddington in  Treatment: 0 Debridement Performed for Assessment: Wound #4 Right,Lateral Knee Performed By: Physician [name], DO Debridement Type: Chemical/Enzymatic/Mechanical Agent Used: Level of Consciousness (Pre-procedure): Awake and Alert Pre-procedure Verification/Time Out Yes - 15:11 Taken: Start Time: 15:11 Pain Control: Other : Benzocaine 20% Instrument: Curette Bleeding: Moderate Hemostasis Achieved: Pressure End Time: 15:13 Procedural Pain: 0 Post Procedural Pain: 0 Response to Treatment: Procedure was tolerated well Level of Consciousness (Post- Awake and Alert procedure): Post Debridement Measurements of Total Wound Length: (cm) 1.2 Stage: Category/Stage II Width: (cm) 0.5 Depth: (cm) 0.1 Volume: (cm) 0.047 Character of Wound/Ulcer Post Debridement: Improved Post Procedure Diagnosis Same as Pre-procedure Electronic Signature(s) Signed: 04/19/2021 4:43:36 PM By: [name] DO Signed: 04/19/2021 5:28:28 PM By: Dellie Catholic RN Entered By: Dellie Catholic on 04/19/2021 16:41:46 -------------------------------------------------------------------------------- HPI Details Patient Name: Date of Service: [name], [name] 04/19/2021 1:15 PM Medical Record Number: [id number] Patient Account Number: [id number] Date of Birth/Sex: Treating RN: [birth year] (71 y.o. [name] Primary Care Provider: Other Clinician: Referring Provider: Treating Provider/Extender: Joanie Coddington in Treatment: 0 History of Present Illness HPI Description: Admission 04/19/2021 [name] is a 71 year old male with a past medical history of end-stage renal disease on hemodialysis, Substance abuse and hep C that presents to the clinic for multiple pressure ulcers to his bottom, sacrum and right knee. His daughter is present and helps provide the history. [place] out [place] when [place] fell and developed pressure injuries. He declined going to a skilled  nursing facility and had not been properly managing the wound beds. He ended up being hospitalized on 12/27/2020 for sepsis secondary to wound infections. He did require intubation for respiratory support. Since then he has moved to live with his daughter who helps take care of the wounds. She has been using calcium alginate to the wound beds. He currently denies signs of infection. Electronic Signature(s) Signed: 04/19/2021 4:43:36 PM By: [name] DO Entered By: [name] on 04/19/2021 15:57:46 -------------------------------------------------------------------------------- Physical Exam Details Patient Name: Date of Service: [name] ID 04/19/2021 1:15 PM Medical Record Number: [id number] Patient Account Number: [id number] Date of Birth/Sex: Treating RN: Jul 06, 1950 (71 y.o. [name] Primary Care Provider: Other Clinician: Referring Provider: Treating Provider/Extender: Joanie Coddington in Treatment: 0 Constitutional respirations regular, non-labored and within target range for patient.. Cardiovascular 2+ dorsalis pedis/posterior tibialis pulses. Psychiatric pleasant and cooperative. Notes T the sacrum there is a small open wound with some scant nonviable tissue and granulation tissue present. Mild odor. o T the right hip there is a large open wound with granulation tissue and nonviable tissue present. No signs of surrounding infection. o T the right ischium there is a small wound that  tunnels that has mild odor to it. o Right lateral knee: Open wound with granulation tissue and scant nonviable tissue. No surrounding signs of infection. Electronic Signature(s) Signed: 04/19/2021 4:43:36 PM By: [name] DO Signed: 04/19/2021 4:43:36 PM By: [name] DO Entered By: [name] on 04/19/2021 15:59:48 -------------------------------------------------------------------------------- Physician Orders Details Patient  Name: Date of Service: [name], Shaune Pascal ID 04/19/2021 1:15 PM Medical Record Number: [id number] Patient Account Number: [id number] Date of Birth/Sex: Treating RN: 10-20-1950 (71 y.o. [name] Primary Care Provider: Other Clinician: Referring Provider: Treating Provider/Extender: Joanie Coddington in Treatment: 0 Verbal / Phone Orders: No Diagnosis Coding Follow-up Appointments ppointment in 2 weeks. - Dr Heber [place] Return A Off-Loading Low air-loss mattress (Group 2) Turn and reposition every 2 hours Wound Treatment Wound #1 - Sacrum Cleanser: Wound Cleanser 1 x Per Day/15 Days Discharge Instructions: Cleanse the wound with wound cleanser prior to applying a clean dressing using gauze sponges, not tissue or cotton balls. Prim Dressing: Dakin's Solution 0.125%, 16 (oz) 1 x Per Day/15 Days ary Discharge Instructions: Moisten gauze with Dakin's solution Secondary Dressing: Bordered Gauze, 2x2 in 1 x Per Day/15 Days Discharge Instructions: Apply over primary dressing as directed. Wound #2 - Ischial Tuberosity Wound Laterality: Right Cleanser: Wound Cleanser 1 x Per Day/15 Days Discharge Instructions: Cleanse the wound with wound cleanser prior to applying a clean dressing using gauze sponges, not tissue or cotton balls. Prim Dressing: Hydrofera Blue Ready Foam, 4x5 in 1 x Per Day/15 Days ary Discharge Instructions: Apply to wound bed as instructed Prim Dressing: 1 x Per Day/15 Days ary Secondary Dressing: Woven Gauze Sponge, Non-Sterile 4x4 in 1 x Per Day/15 Days Discharge Instructions: Apply over primary dressing as directed. Secondary Dressing: ABD Pad, 8x10 1 x Per Day/15 Days Discharge Instructions: Apply over primary dressing as directed. Secured With: 96M Medipore H Soft Cloth Surgical T ape, 4 x 10 (in/yd) 1 x Per Day/15 Days Discharge Instructions: Secure with tape as directed. Wound #3 - Gluteus Wound Laterality: Right Cleanser: Wound Cleanser 1  x Per Day/15 Days Discharge Instructions: Cleanse the wound with wound cleanser prior to applying a clean dressing using gauze sponges, not tissue or cotton balls. Prim Dressing: Dakin's Solution 0.125%, 16 (oz) 1 x Per Day/15 Days ary Discharge Instructions: Moisten gauze with Dakin's solution Secondary Dressing: Bordered Gauze, 2x2 in 1 x Per Day/15 Days Discharge Instructions: Apply over primary dressing as directed. Wound #4 - Knee Wound Laterality: Right, Lateral Cleanser: Wound Cleanser 1 x Per Day/15 Days Discharge Instructions: Cleanse the wound with wound cleanser prior to applying a clean dressing using gauze sponges, not tissue or cotton balls. Prim Dressing: Hydrofera Blue Ready Foam, 2.5 x2.5 in 1 x Per Day/15 Days ary Discharge Instructions: Apply to wound bed as instructed Secondary Dressing: Bordered Gauze, 4x4 in 1 x Per Day/15 Days Discharge Instructions: Apply over primary dressing as directed. Patient Medications llergies: No Known Allergies A Notifications Medication Indication Start End 04/19/2021 Dakin's Solution DOSE 1 - miscellaneous 0.125 % solution - use for wet to dry dressings Electronic Signature(s) Signed: 04/19/2021 4:43:36 PM By: [name] DO Previous Signature: 04/19/2021 4:05:32 PM Version By: [name] DO Entered By: [name] on 04/19/2021 16:28:48 -------------------------------------------------------------------------------- Problem List Details Patient Name: Date of Service: [name] ID 04/19/2021 1:15 PM Medical Record Number: [id number] Patient Account Number: [id number] Date of Birth/Sex: Treating RN: [birth year] (71 y.o. [name] Primary Care Provider: Other Clinician: Referring Provider: Treating Provider/Extender:  Tommy Medal Weeks in Treatment: 0 Active Problems ICD-10 Encounter Code Description Active Date MDM Diagnosis L89.153 Pressure ulcer of sacral region, stage 3  04/19/2021 No Yes L89.313 Pressure ulcer of right buttock, stage 3 04/19/2021 No Yes L89.892 Pressure ulcer of other site, stage 2 04/19/2021 No Yes N18.6 End stage renal disease 04/19/2021 No Yes Inactive Problems Resolved Problems Electronic Signature(s) Signed: 04/19/2021 4:43:36 PM By: [name] DO Entered By: [name] on 04/19/2021 15:47:26 -------------------------------------------------------------------------------- Progress Note Details Patient Name: Date of Service: [name] ID 04/19/2021 1:15 PM Medical Record Number: [id number] Patient Account Number: [id number] Date of Birth/Sex: Treating RN: [birth year] (71 y.o. [name] Primary Care Provider: Other Clinician: Referring Provider: Treating Provider/Extender: Joanie Coddington in Treatment: 0 Subjective Chief Complaint Information obtained from Patient Multiple pressure ulcers to the sacrum, buttocks and knee History of Present Illness (HPI) Admission 04/19/2021 [name] is a 71 year old male with a past medical history of end-stage renal disease on hemodialysis, Substance abuse and hep C that presents to the clinic for multiple pressure ulcers to his bottom, sacrum and right knee. His daughter is present and helps provide the history. He was living out of state when he fell and developed pressure injuries. He declined going to a skilled nursing facility and had not been properly managing the wound beds. He ended up being hospitalized on 12/27/2020 for sepsis secondary to wound infections. He did require intubation for respiratory support. Since then he has moved to live with his daughter who helps take care of the wounds. She has been using calcium alginate to the wound beds. He currently denies signs of infection. Patient History Information obtained from Patient. Allergies No Known Allergies Family History Unknown History. Social History Former smoker - Quit Nov  2022, Alcohol Use - Never, Drug Use - Prior History - Marijuana, Caffeine Use - Never. Medical History Cardiovascular Patient has history of Arrhythmia - A.Fib Denies history of Angina, Congestive Heart Failure, Coronary Artery Disease, Deep Vein Thrombosis, Hypertension, Hypotension, Myocardial Infarction, Peripheral Arterial Disease, Peripheral Venous Disease, Phlebitis, Vasculitis Genitourinary Denies history of End Stage Renal Disease Integumentary (Skin) Denies history of History of Burn Hospitalization/Surgery History - Right hip Repair (most recent Nov.2022). Medical A Surgical History Notes nd Respiratory Pt. was on the ventilator-wears oxygen as needed Genitourinary Pt. is on Dialysis Tues,Thurs,Sat Review of Systems (ROS) Constitutional Symptoms (General Health) Denies complaints or symptoms of Fatigue, Fever, Chills, Marked Weight Change. Ear/Nose/Mouth/Throat Denies complaints or symptoms of Chronic sinus problems or rhinitis. Respiratory Denies complaints or symptoms of Chronic or frequent coughs, Shortness of Breath. Cardiovascular Denies complaints or symptoms of Chest pain. Endocrine Denies complaints or symptoms of Heat/cold intolerance. Genitourinary Denies complaints or symptoms of Frequent urination. Integumentary (Skin) Complains or has symptoms of Wounds - Wound on Right hip. Musculoskeletal Denies complaints or symptoms of Muscle Pain, Muscle Weakness. Neurologic Denies complaints or symptoms of Numbness/parasthesias. Psychiatric Denies complaints or symptoms of Claustrophobia, Suicidal. Objective Constitutional respirations regular, non-labored and within target range for patient.. Vitals Time Taken: 1:35 PM, Height: 73 in, Source: Stated, Weight: 147 lbs, Source: Stated, BMI: 19.4, Temperature: 98.3 F, Pulse: 78 bpm, Respiratory Rate: 16 breaths/min, Blood Pressure: 114/81 mmHg. Cardiovascular 2+ dorsalis pedis/posterior tibialis  pulses. Psychiatric pleasant and cooperative. General Notes: T the sacrum there is a small open wound with some scant nonviable tissue and granulation tissue present. Mild odor. T the right hip there is a o o large open wound with  granulation tissue and nonviable tissue present. No signs of surrounding infection. T the right ischium there is a small wound that o tunnels that has mild odor to it. Right lateral knee: Open wound with granulation tissue and scant nonviable tissue. No surrounding signs of infection. Integumentary (Hair, Skin) Wound #1 status is Open. Original cause of wound was Pressure Injury. The date acquired was: 01/26/2021. The wound is located on the Sacrum. The wound measures 2.5cm length x 1.6cm width x 0.8cm depth; 3.142cm^2 area and 2.513cm^3 volume. There is Fat Layer (Subcutaneous Tissue) exposed. There is no tunneling or undermining noted. There is a medium amount of serosanguineous drainage noted. There is medium (34-66%) granulation within the wound bed. There is a medium (34-66%) amount of necrotic tissue within the wound bed including Adherent Slough. Wound #2 status is Open. Original cause of wound was Pressure Injury. The date acquired was: 01/26/2021. The wound is located on the Right Ischial Tuberosity. The wound measures 11.7cm length x 6cm width x 0.2cm depth; 55.135cm^2 area and 11.027cm^3 volume. There is Fat Layer (Subcutaneous Tissue) exposed. There is no tunneling or undermining noted. There is a medium amount of serosanguineous drainage noted. There is large (67-100%) red granulation within the wound bed. There is a small (1-33%) amount of necrotic tissue within the wound bed including Adherent Slough. Wound #2 status is Open. Original cause of wound was Pressure Injury. The date acquired was: 01/26/2021. The wound is located on the Right Ischial Tuberosity. The wound measures 11.7cm length x 6cm width x 0.2cm depth; 55.135cm^2 area and 11.027cm^3 volume. There  is Fat Layer (Subcutaneous Tissue) exposed. There is a medium amount of serosanguineous drainage noted. There is large (67-100%) granulation within the wound bed. There is a small (1-33%) amount of necrotic tissue within the wound bed including Adherent Slough. Wound #3 status is Open. Original cause of wound was Pressure Injury. The date acquired was: 01/26/2021. The wound is located on the Right Gluteus. The wound measures 1.8cm length x 1.9cm width x 1.1cm depth; 2.686cm^2 area and 2.955cm^3 volume. There is Fat Layer (Subcutaneous Tissue) exposed. There is a medium amount of serosanguineous drainage noted. There is medium (34-66%) pink granulation within the wound bed. There is a medium (34-66%) amount of necrotic tissue within the wound bed including Adherent Slough. Wound #4 status is Open. Original cause of wound was Pressure Injury. The date acquired was: 01/26/2021. The wound is located on the Right,Lateral Knee. The wound measures 1.2cm length x 0.5cm width x 0.1cm depth; 0.471cm^2 area and 0.047cm^3 volume. There is Fat Layer (Subcutaneous Tissue) exposed. There is no tunneling or undermining noted. There is a medium amount of serosanguineous drainage noted. There is large (67-100%) red granulation within the wound bed. There is a small (1-33%) amount of necrotic tissue within the wound bed including Adherent Slough. Assessment Active Problems ICD-10 Pressure ulcer of sacral region, stage 3 Pressure ulcer of right buttock, stage 3 Pressure ulcer of other site, stage 2 End stage renal disease Patient has multiple pressure ulcers. It is unclear exactly how these developed. I suspect he was on the floor for a long period of time after his fall. Daughter also states that some of these developed while in the hospital. I debrided nonviable tissue. There are no obvious signs of surrounding infection. I recommended Hydrofera Blue to the right hip wound and right knee wound. I recommended Dakin's  wet-to-dry dressings to the other sites. We had a long discussion about the importance of offloading for  wound healing. We will obtain a group 2 air mattress for the patient. Follow-up in 2 weeks. Procedures Wound #1 Pre-procedure diagnosis of Wound #1 is a Pressure Ulcer located on the Sacrum . There was a Chemical/Enzymatic/Mechanical debridement performed by [name], DO. With the following instrument(s): Curette to remove Non-Viable tissue/material. Material removed includes Subcutaneous Tissue, Slough, Skin: Dermis, and Skin: Epidermis after achieving pain control using Other (Benzocaine 20%). A time out was conducted at 15:11, prior to the start of the procedure. A Moderate amount of bleeding was controlled with Pressure. The procedure was tolerated well with a pain level of 0 throughout and a pain level of 0 following the procedure. Post Debridement Measurements: 2.5cm length x 1.6cm width x 0.8cm depth; 2.513cm^3 volume. Post debridement Stage noted as Category/Stage III. Character of Wound/Ulcer Post Debridement is improved. Post procedure Diagnosis Wound #1: Same as Pre-Procedure Wound #2 Pre-procedure diagnosis of Wound #2 is a Pressure Ulcer located on the Right Ischial Tuberosity . There was a Excisional Skin/Subcutaneous Tissue Debridement with a total area of 70.2 sq cm performed by [name], DO. With the following instrument(s): Curette to remove Non-Viable tissue/material. Material removed includes Subcutaneous Tissue, Slough, Skin: Dermis, and Skin: Epidermis after achieving pain control using Other (Benzocaine 20%). No specimens were taken. A time out was conducted at 15:11, prior to the start of the procedure. A Moderate amount of bleeding was controlled with Pressure. The procedure was tolerated well with a pain level of 0 throughout and a pain level of 0 following the procedure. Post Debridement Measurements: 11.7cm length x 6cm width x 0.2cm depth; 11.027cm^3  volume. Post debridement Stage noted as Category/Stage III. Character of Wound/Ulcer Post Debridement is improved. Post procedure Diagnosis Wound #2: Same as Pre-Procedure Wound #3 Pre-procedure diagnosis of Wound #3 is a Pressure Ulcer located on the Right Gluteus . There was a Chemical/Enzymatic/Mechanical debridement performed by [name], DO. With the following instrument(s): Curette to remove Non-Viable tissue/material. Material removed includes Subcutaneous Tissue, Slough, Skin: Dermis, and Skin: Epidermis after achieving pain control using Other (Benzocaine 20%). A time out was conducted at 15:11, prior to the start of the procedure. A Moderate amount of bleeding was controlled with Pressure. The procedure was tolerated well with a pain level of 0 throughout and a pain level of 0 following the procedure. Post Debridement Measurements: 1.8cm length x 1.9cm width x 1.1cm depth; 2.955cm^3 volume. Post debridement Stage noted as Category/Stage III. Character of Wound/Ulcer Post Debridement is improved. Post procedure Diagnosis Wound #3: Same as Pre-Procedure Wound #4 Pre-procedure diagnosis of Wound #4 is a Pressure Ulcer located on the Right,Lateral Knee . There was a Chemical/Enzymatic/Mechanical debridement performed by [name], DO. With the following instrument(s): Curette to remove Non-Viable tissue/material. Material removed includes Subcutaneous Tissue, Slough, Skin: Dermis, and Skin: Epidermis after achieving pain control using Other (Benzocaine 20%). A time out was conducted at 15:11, prior to the start of the procedure. A Moderate amount of bleeding was controlled with Pressure. The procedure was tolerated well with a pain level of 0 throughout and a pain level of 0 following the procedure. Post Debridement Measurements: 1.2cm length x 0.5cm width x 0.1cm depth; 0.047cm^3 volume. Post debridement Stage noted as Category/Stage II. Character of Wound/Ulcer Post  Debridement is improved. Post procedure Diagnosis Wound #4: Same as Pre-Procedure Plan Follow-up Appointments: Return Appointment in 2 weeks. - Dr Heber Encinal Off-Loading: Low air-loss mattress (Group 2) Turn and reposition every 2 hours The following medication(s) was prescribed: Dakin's Solution  miscellaneous 0.125 % solution 1 use for wet to dry dressings starting 04/19/2021 WOUND #1: - Sacrum Wound Laterality: Cleanser: Wound Cleanser 1 x Per Day/15 Days Discharge Instructions: Cleanse the wound with wound cleanser prior to applying a clean dressing using gauze sponges, not tissue or cotton balls. Prim Dressing: Dakin's Solution 0.125%, 16 (oz) 1 x Per Day/15 Days ary Discharge Instructions: Moisten gauze with Dakin's solution Secondary Dressing: Bordered Gauze, 2x2 in 1 x Per Day/15 Days Discharge Instructions: Apply over primary dressing as directed. WOUND #2: - Ischial Tuberosity Wound Laterality: Right Cleanser: Wound Cleanser 1 x Per ZGY/17 Days Discharge Instructions: Cleanse the wound with wound cleanser prior to applying a clean dressing using gauze sponges, not tissue or cotton balls. Prim Dressing: Hydrofera Blue Ready Foam, 4x5 in 1 x Per Day/15 Days ary Discharge Instructions: Apply to wound bed as instructed Prim Dressing: 1 x Per Day/15 Days ary Secondary Dressing: Woven Gauze Sponge, Non-Sterile 4x4 in 1 x Per Day/15 Days Discharge Instructions: Apply over primary dressing as directed. Secondary Dressing: ABD Pad, 8x10 1 x Per Day/15 Days Discharge Instructions: Apply over primary dressing as directed. Secured With: 33M Medipore H Soft Cloth Surgical T ape, 4 x 10 (in/yd) 1 x Per Day/15 Days Discharge Instructions: Secure with tape as directed. WOUND #3: - Gluteus Wound Laterality: Right Cleanser: Wound Cleanser 1 x Per Day/15 Days Discharge Instructions: Cleanse the wound with wound cleanser prior to applying a clean dressing using gauze sponges, not tissue or cotton  balls. Prim Dressing: Dakin's Solution 0.125%, 16 (oz) 1 x Per Day/15 Days ary Discharge Instructions: Moisten gauze with Dakin's solution Secondary Dressing: Bordered Gauze, 2x2 in 1 x Per Day/15 Days Discharge Instructions: Apply over primary dressing as directed. WOUND #4: - Knee Wound Laterality: Right, Lateral Cleanser: Wound Cleanser 1 x Per Day/15 Days Discharge Instructions: Cleanse the wound with wound cleanser prior to applying a clean dressing using gauze sponges, not tissue or cotton balls. Prim Dressing: Hydrofera Blue Ready Foam, 2.5 x2.5 in 1 x Per Day/15 Days ary Discharge Instructions: Apply to wound bed as instructed Secondary Dressing: Bordered Gauze, 4x4 in 1 x Per Day/15 Days Discharge Instructions: Apply over primary dressing as directed. 1. Dakin's wet-to-dry 2. Hydrofera Blue 3. Air mattress 4. Aggressive offloading 5. Follow-up in 2 weeks Electronic Signature(s) Signed: 04/19/2021 4:43:36 PM By: [name] DO Entered By: [name] on 04/19/2021 16:29:05 -------------------------------------------------------------------------------- HxROS Details Patient Name: Date of Service: [name], [name] 04/19/2021 1:15 PM Medical Record Number: [id number] Patient Account Number: [id number] Date of Birth/Sex: Treating RN: [birth year] (71 y.o. [name] Primary Care Provider: Other Clinician: Referring Provider: Treating Provider/Extender: Joanie Coddington in Treatment: 0 Information Obtained From Patient Constitutional Symptoms (General Health) Complaints and Symptoms: Negative for: Fatigue; Fever; Chills; Marked Weight Change Ear/Nose/Mouth/Throat Complaints and Symptoms: Negative for: Chronic sinus problems or rhinitis Respiratory Complaints and Symptoms: Negative for: Chronic or frequent coughs; Shortness of Breath Medical History: Past Medical History Notes: Pt. was on the ventilator-wears oxygen as  needed Cardiovascular Complaints and Symptoms: Negative for: Chest pain Medical History: Positive for: Arrhythmia - A.Fib Negative for: Angina; Congestive Heart Failure; Coronary Artery Disease; Deep Vein Thrombosis; Hypertension; Hypotension; Myocardial Infarction; Peripheral Arterial Disease; Peripheral Venous Disease; Phlebitis; Vasculitis Endocrine Complaints and Symptoms: Negative for: Heat/cold intolerance Genitourinary Complaints and Symptoms: Negative for: Frequent urination Medical History: Negative for: End Stage Renal Disease Past Medical History Notes: Pt. is on Dialysis Tues,Thurs,Sat Integumentary (Skin) Complaints and Symptoms: Positive  for: Wounds - Wound on Right hip Medical History: Negative for: History of Burn Musculoskeletal Complaints and Symptoms: Negative for: Muscle Pain; Muscle Weakness Neurologic Complaints and Symptoms: Negative for: Numbness/parasthesias Psychiatric Complaints and Symptoms: Negative for: Claustrophobia; Suicidal Hematologic/Lymphatic Immunological Oncologic Immunizations Pneumococcal Vaccine: Received Pneumococcal Vaccination: No Implantable Devices Yes Hospitalization / Surgery History Type of Hospitalization/Surgery Right hip Repair (most recent Nov.2022) Family and Social History Unknown History: Yes; Former smoker - Quit Nov 2022; Alcohol Use: Never; Drug Use: Prior History - Marijuana; Caffeine Use: Never; Financial Concerns: No; Food, Clothing or Shelter Needs: No; Support System Lacking: No; Transportation Concerns: No Electronic Signature(s) Signed: 04/19/2021 4:43:36 PM By: [name] DO Signed: 04/19/2021 5:28:28 PM By: Dellie Catholic RN Entered By: Dellie Catholic on 04/19/2021 13:46:09 -------------------------------------------------------------------------------- SuperBill Details Patient Name: Date of Service: [name], [name] 04/19/2021 Medical Record Number: [id number] Patient Account Number:  [id number] Date of Birth/Sex: Treating RN: 1950-08-24 (71 y.o. [name] Primary Care Provider: Other Clinician: Referring Provider: Treating Provider/Extender: Joanie Coddington in Treatment: 0 Diagnosis Coding ICD-10 Codes Code Description [phone] Pressure ulcer of sacral region, stage 3 L89.313 Pressure ulcer of right buttock, stage 3 L89.892 Pressure ulcer of other site, stage 2 N18.6 End stage renal disease Facility Procedures CPT4 Code: [id number] Description: [facility] VISIT-LEV 4 EST PT Modifier: 25 Quantity: 1 CPT4 Code: [id number] Description: 11216 - DEB SUBQ TISSUE 20 SQ CM/< ICD-10 Diagnosis Description L89.313 Pressure ulcer of right buttock, stage 3 Modifier: Quantity: 1 Physician Procedures : CPT4 Code Description Modifier [id number] 72257 - WC PHYS LEVEL 4 - NEW PT ICD-10 Diagnosis Description L89.153 Pressure ulcer of sacral region, stage 3 L89.313 Pressure ulcer of right buttock, stage 3 L89.892 Pressure ulcer of other site, stage 2 N18.6  End stage renal disease Quantity: 1 : [id number] 11042 - WC PHYS SUBQ TISS 20 SQ CM ICD-10 Diagnosis Description L89.313 Pressure ulcer of right buttock, stage 3 Quantity: 1 : [id number] 84210 - WC PHYS SUBQ TISS EA ADDL 20 CM ICD-10 Diagnosis Description L89.313 Pressure ulcer of right buttock, stage 3 Quantity: 3 Electronic Signature(s) Signed: 04/19/2021 5:28:28 PM By: Dellie Catholic RN Signed: 04/20/2021 12:25:45 PM By: [name] DO Previous Signature: 04/19/2021 4:43:36 PM Version By: [name] DO Entered By: Dellie Catholic on 04/19/2021 16:55:57

## 2021-04-21 NOTE — Progress Notes (Deleted)
Office Note     CC:  ESRD Requesting Provider:  Claudia Desanctis, MD  HPI: Cipriano Millikan is a {Handed:22697} handed 71 y.o. ([birth year]) male with kidney disease who presents at the request of Claudia Desanctis, MD for permanent HD access. The patient has had no prior access procedures. No tunneled lines, Currently CKD5  On exam, ***  The pt is *** on a statin for cholesterol management.  The pt is *** on a daily aspirin.   Other AC:  *** The pt is *** on medications for hypertension.   The pt is *** diabetic. Tobacco hx:  ***  Past Medical History:  Diagnosis Date   Edema    ESRD (end stage renal disease) ([place])    History of insertion of tunneled central venous catheter (CVC) with port    Hypotension     *** The histories are not reviewed yet. Please review them in the "History" navigator section and refresh this [place].  Social History   Socioeconomic History   Marital status: Single    Spouse name: Not on file   Number of children: Not on file   Years of education: Not on file   Highest education level: Not on file  Occupational History   Not on file  Tobacco Use   Smoking status: Not on file   Smokeless tobacco: Not on file  Substance and Sexual Activity   Alcohol use: Not on file   Drug use: Not on file   Sexual activity: Not on file  Other Topics Concern   Not on file  Social History Narrative   Not on file   Social Determinants of Health   Financial Resource Strain: Not on file  Food Insecurity: Not on file  Transportation Needs: Not on file  Physical Activity: Not on file  Stress: Not on file  Social Connections: Not on file  Intimate Partner Violence: Not on file   ***No family history on file.  Current Outpatient Medications  Medication Sig Dispense Refill   albuterol (2.5 MG/3ML) 0.083% NEBU 3 mL, albuterol (5 MG/ML) 0.5% NEBU 0.5 mL Inhale into the lungs.     amiodarone (PACERONE) 200 MG tablet Take 200 mg by mouth daily.     apixaban  (ELIQUIS) 5 MG TABS tablet Take 5 mg by mouth 2 (two) times daily.     carvedilol (COREG) 6.25 MG tablet Take 6.25 mg by mouth 2 (two) times daily with a meal.     cinacalcet (SENSIPAR) 30 MG tablet Take 30 mg by mouth daily.     cyclobenzaprine (FLEXERIL) 10 MG tablet Take 10 mg by mouth 3 (three) times daily as needed for muscle spasms.     hydrALAZINE (APRESOLINE) 50 MG tablet Take 50 mg by mouth 3 (three) times daily.     lisinopril (ZESTRIL) 20 MG tablet Take 20 mg by mouth daily.     metoprolol succinate (TOPROL-XL) 50 MG 24 hr tablet Take 50 mg by mouth daily. Take with or immediately following a meal.     No current facility-administered medications for this visit.    Not on File   REVIEW OF SYSTEMS:  *** [X]  denotes positive finding, [ ]  denotes negative finding Cardiac  Comments:  Chest pain or chest pressure:    Shortness of breath upon exertion:    Short of breath when lying flat:    Irregular heart rhythm:        Vascular    Pain in calf, thigh, or hip  brought on by ambulation:    Pain in feet at night that wakes you up from your sleep:     Blood clot in your veins:    Leg swelling:         Pulmonary    Oxygen at home:    Productive cough:     Wheezing:         Neurologic    Sudden weakness in arms or legs:     Sudden numbness in arms or legs:     Sudden onset of difficulty speaking or slurred speech:    Temporary loss of vision in one eye:     Problems with dizziness:         Gastrointestinal    Blood in stool:     Vomited blood:         Genitourinary    Burning when urinating:     Blood in urine:        Psychiatric    Major depression:         Hematologic    Bleeding problems:    Problems with blood clotting too easily:        Skin    Rashes or ulcers:        Constitutional    Fever or chills:      PHYSICAL EXAMINATION:  There were no vitals filed for this visit.  General:  WDWN in NAD; vital signs documented above Gait: Not  observed HENT: WNL, normocephalic Pulmonary: normal non-labored breathing , without Rales, rhonchi,  wheezing Cardiac: {Desc; regular/irreg:14544} HR, without  Murmurs {With/Without:20273} carotid bruit*** Abdomen: soft, NT, no masses Skin: {With/Without:20273} rashes Vascular Exam/Pulses:  Right Left  Radial {Exam; arterial pulse strength 0-4:30167} {Exam; arterial pulse strength 0-4:30167}  Ulnar {Exam; arterial pulse strength 0-4:30167} {Exam; arterial pulse strength 0-4:30167}  Femoral {Exam; arterial pulse strength 0-4:30167} {Exam; arterial pulse strength 0-4:30167}  Popliteal {Exam; arterial pulse strength 0-4:30167} {Exam; arterial pulse strength 0-4:30167}  DP {Exam; arterial pulse strength 0-4:30167} {Exam; arterial pulse strength 0-4:30167}  PT {Exam; arterial pulse strength 0-4:30167} {Exam; arterial pulse strength 0-4:30167}   Extremities: {With/Without:20273} ischemic changes, {With/Without:20273} Gangrene , {With/Without:20273} cellulitis; {With/Without:20273} open wounds;  Musculoskeletal: no muscle wasting or atrophy  Neurologic: A&O X 3;  No focal weakness or paresthesias are detected Psychiatric:  The pt has {Desc; normal/abnormal:11317::"Normal"} affect.   Non-Invasive Vascular Imaging:   ***    ASSESSMENT/PLAN:  Harvest Stanco is a 71 y.o. male who presents with {KidneyDisease:19197::"end stage renal disease","chronic kidney disease stage ***"}  Based on vein mapping and examination, ***. I had an extensive discussion with this patient in regards to the nature of access surgery, including risk, benefits, and alternatives.   The patient is aware that the risks of access surgery include but are not limited to: bleeding, infection, steal syndrome, nerve damage, ischemic monomelic neuropathy, failure of access to mature, complications related to venous hypertension, and possible need for additional access procedures in the future. *** I discussed with the patient the  nature of the staged access procedure, specifically the need for a second operation to transpose the first stage fistula if it matures adequately.   The patient has *** agreed to proceed with the above procedure which will be scheduled ***.  Broadus John, MD [facility] [phone]

## 2021-04-23 ENCOUNTER — Encounter: Payer: Medicare (Managed Care) | Admitting: Vascular Surgery

## 2021-04-23 ENCOUNTER — Encounter (HOSPITAL_COMMUNITY): Payer: Medicare (Managed Care)

## 2021-04-23 ENCOUNTER — Other Ambulatory Visit (HOSPITAL_COMMUNITY): Payer: Medicare (Managed Care)

## 2021-05-03 ENCOUNTER — Encounter (HOSPITAL_BASED_OUTPATIENT_CLINIC_OR_DEPARTMENT_OTHER): Payer: Medicare (Managed Care) | Attending: Internal Medicine | Admitting: Internal Medicine

## 2021-05-03 ENCOUNTER — Other Ambulatory Visit: Payer: Self-pay

## 2021-05-03 DIAGNOSIS — N186 End stage renal disease: Secondary | ICD-10-CM | POA: Insufficient documentation

## 2021-05-03 DIAGNOSIS — I4891 Unspecified atrial fibrillation: Secondary | ICD-10-CM | POA: Insufficient documentation

## 2021-05-03 DIAGNOSIS — L89153 Pressure ulcer of sacral region, stage 3: Secondary | ICD-10-CM | POA: Insufficient documentation

## 2021-05-03 DIAGNOSIS — Z992 Dependence on renal dialysis: Secondary | ICD-10-CM | POA: Insufficient documentation

## 2021-05-03 DIAGNOSIS — L89313 Pressure ulcer of right buttock, stage 3: Secondary | ICD-10-CM | POA: Diagnosis not present

## 2021-05-03 DIAGNOSIS — L89892 Pressure ulcer of other site, stage 2: Secondary | ICD-10-CM | POA: Insufficient documentation

## 2021-05-03 DIAGNOSIS — Z87891 Personal history of nicotine dependence: Secondary | ICD-10-CM | POA: Insufficient documentation

## 2021-05-03 NOTE — Progress Notes (Signed)
Leonard, Sims ([id number]) Visit Report for 05/03/2021 Arrival Information Details Patient Name: Date of Service: [name] ID 05/03/2021 10:30 A M Medical Record Number: [id number] Patient Account Number: [id number] Date of Birth/Sex: Treating RN: 02-24-1951 (71 y.o. [name] Primary Care Arlo Butt: Other Clinician: Referring Deneisha Dade: Treating Rabecca Birge/Extender: Joanie Coddington in Treatment: 2 Visit Information History Since Last Visit Added or deleted any medications: No Patient Arrived: Wheel Chair Any new allergies or adverse reactions: No Arrival Time: 10:41 Had a fall or experienced change in No Accompanied By: alone activities of daily living that may affect Transfer Assistance: Manual risk of falls: Patient Identification Verified: Yes Signs or symptoms of abuse/neglect since last visito No Secondary Verification Process Completed: Yes Hospitalized since last visit: No Patient Requires Transmission-Based Precautions: No Implantable device outside of the clinic excluding No Patient Has Alerts: No cellular tissue based products placed in the center since last visit: Has Dressing in Place as Prescribed: Yes Pain Present Now: Yes Electronic Signature(s) Signed: 05/03/2021 5:37:03 PM By: Levan Hurst RN, BSN Entered By: Levan Hurst on 05/03/2021 10:42:25 -------------------------------------------------------------------------------- Clinic Level of Care Assessment Details Patient Name: Date of Service: [name], [name] 05/03/2021 10:30 A M Medical Record Number: [id number] Patient Account Number: [id number] Date of Birth/Sex: Treating RN: 12/08/1950 (71 y.o. [name] Primary Care Hazely Sealey: Other Clinician: Referring Alynna Hargrove: Treating Edker Punt/Extender: Joanie Coddington in Treatment: 2 Clinic Level of Care Assessment Items TOOL 4 Quantity Score X- 1 0 Use when only an EandM is performed on FOLLOW-UP  visit ASSESSMENTS - Nursing Assessment / Reassessment X- 1 10 Reassessment of Co-morbidities (includes updates in patient status) X- 1 5 Reassessment of Adherence to Treatment Plan ASSESSMENTS - Wound and Skin A ssessment / Reassessment _0  - 0 Simple Wound Assessment / Reassessment - one wound X- 4 5 Complex Wound Assessment / Reassessment - multiple wounds _1  - 0 Dermatologic / Skin Assessment (not related to wound area) ASSESSMENTS - Focused Assessment _2  - 0 Circumferential Edema Measurements - multi extremities _3  - 0 Nutritional Assessment / Counseling / Intervention _4  - 0 Lower Extremity Assessment (monofilament, tuning fork, pulses) _5  - 0 Peripheral Arterial Disease Assessment (using hand held doppler) ASSESSMENTS - Ostomy and/or Continence Assessment and Care _6  - 0 Incontinence Assessment and Management _7  - 0 Ostomy Care Assessment and Management (repouching, etc.) PROCESS - Coordination of Care X - Simple Patient / Family Education for ongoing care 1 15 _8  - 0 Complex (extensive) Patient / Family Education for ongoing care X- 1 10 Staff obtains Programmer, systems, Records, T Results / Process Orders est _9  - 0 Staff telephones HHA, Nursing Homes / Clarify orders / etc _10  - 0 Routine Transfer to another Facility (non-emergent condition) _11  - 0 Routine Hospital Admission (non-emergent condition) _12  - 0 New Admissions / Biomedical engineer / Ordering NPWT Apligraf, etc. , _13  - 0 Emergency Hospital Admission (emergent condition) X- 1 10 Simple Discharge Coordination _14  - 0 Complex (extensive) Discharge Coordination PROCESS - Special Needs _15  - 0 Pediatric / Minor Patient Management _16  - 0 Isolation Patient Management _17  - 0 Hearing / Language / Visual special needs _18  - 0 Assessment of Community assistance (transportation, D/C planning, etc.) _19  - 0 Additional assistance / Altered mentation _20  - 0 Support Surface(s) Assessment (bed, cushion, seat,  etc.) INTERVENTIONS - Wound Cleansing / Measurement _21  - 0 Simple Wound Cleansing - one wound X- 4 5 Complex Wound Cleansing - multiple wounds X- 1  5 Wound Imaging (photographs - any number of wounds) _0  - 0 Wound Tracing (instead of photographs) _1  - 0 Simple Wound Measurement - one wound X- 4 5 Complex Wound Measurement - multiple wounds INTERVENTIONS - Wound Dressings X - Small Wound Dressing one or multiple wounds 3 10 X- 1 15 Medium Wound Dressing one or multiple wounds _2  - 0 Large Wound Dressing one or multiple wounds <ZOXWRUEAVWUJWJXB>_1<\/YNWGNFAOZHYQMVHQ>_4  - 0 Application of Medications - topical <ONGEXBMWUXLKGMWN>_0<\/UVOZDGUYQIHKVQQV>_9  - 0 Application of Medications - injection INTERVENTIONS - Miscellaneous _5  - 0 External ear exam _6  - 0 Specimen Collection (cultures, biopsies, blood, body fluids, etc.) _7  - 0 Specimen(s) / Culture(s) sent or taken to Lab for analysis _8  - 0 Patient Transfer (multiple staff / Civil Service fast streamer / Similar devices) _9  - 0 Simple Staple / Suture removal (25 or less) _10  - 0 Complex Staple / Suture removal (26 or more) _11  - 0 Hypo / Hyperglycemic Management (close monitor of Blood Glucose) _12  - 0 Ankle / Brachial Index (ABI) - do not check if billed separately X- 1 5 Vital Signs Has the patient been seen at the hospital within the last three years: Yes Total Score: 165 Level Of Care: New/Established - Level 5 Electronic Signature(s) Signed: 05/03/2021 5:37:03 PM By: Levan Hurst RN, BSN Entered By: Levan Hurst on 05/03/2021 14:15:02 -------------------------------------------------------------------------------- Encounter Discharge Information Details Patient Name: Date of Service: [name], [name] 05/03/2021 10:30 A M Medical Record Number: [id number] Patient Account Number: [id number] Date of Birth/Sex: Treating RN: 08-Apr-1950 (71 y.o. [name] Primary Care Noelani Harbach: Other Clinician: Referring Madsen Riddle: Treating Yara Tomkinson/Extender: Joanie Coddington in Treatment:  2 Encounter Discharge Information Items Discharge Condition: Stable Ambulatory Status: Wheelchair Discharge Destination: Home Transportation: Private Auto Accompanied By: alone Schedule Follow-up Appointment: Yes Clinical Summary of Care: Patient Declined Electronic Signature(s) Signed: 05/03/2021 5:37:03 PM By: Levan Hurst RN, BSN Entered By: Levan Hurst on 05/03/2021 14:16:53 -------------------------------------------------------------------------------- Multi Wound Chart Details Patient Name: Date of Service: [name], [name] 05/03/2021 10:30 A M Medical Record Number: [id number] Patient Account Number: [id number] Date of Birth/Sex: Treating RN: 18-Jul-1950 (71 y.o. [name] Primary Care Kaoru Benda: Other Clinician: Referring Taniqua Issa: Treating Wentworth Edelen/Extender: Joanie Coddington in Treatment: 2 Vital Signs Height(in): 73 Pulse(bpm): 66 Weight(lbs): 147 Blood Pressure(mmHg): 154/98 Body Mass Index(BMI): 19.4 Temperature(F): 97.5 Respiratory Rate(breaths/min): 18 Photos: [1:Sacrum] [2:Right Trochanter] [3:Right Gluteus] Wound Location: [1:Pressure Injury] [2:Pressure Injury] [3:Pressure Injury] Wounding Event: [1:Pressure Ulcer] [2:Pressure Ulcer] [3:Pressure Ulcer] Primary Etiology: [1:Arrhythmia] [2:Arrhythmia] [3:Arrhythmia] Comorbid History: [1:01/26/2021] [2:01/26/2021] [3:01/26/2021] Date Acquired: [1:2] [2:2] [3:2] Weeks of Treatment: [1:Open] [2:Open] [3:Open] Wound Status: [1:No] [2:No] [3:No] Wound Recurrence: [1:2.4x1x0.9] [2:10.9x4.7x0.2] [3:1x0.5x1.2] Measurements L x W x D (cm) [1:1.885] [2:40.236] [3:0.393] A (cm) : rea [1:1.696] [2:8.047] [3:0.471] Volume (cm) : [1:40.00%] [2:27.00%] [3:85.40%] % Reduction in A rea: [1:32.50%] [2:27.00%] [3:84.10%] % Reduction in Volume: [3:12] Position 1 (o'clock): [3:1.5] Maximum Distance 1 (cm): [1:12] Starting Position 1 (o'clock): [1:4] Ending Position 1 (o'clock):  [1:1.4] Maximum Distance 1 (cm): [1:No] [2:No] [3:Yes] Tunneling: [1:Yes] [2:No] [3:No] Undermining: [1:Category/Stage III] [2:Category/Stage III] [3:Category/Stage III] Classification: [1:Medium] [2:Medium] [3:Medium] Exudate A mount: [1:Serosanguineous] [2:Serosanguineous] [3:Serosanguineous] Exudate Type: [1:red, brown] [2:red, brown] [3:red, brown] Exudate Color: [1:Well defined, not attached] [2:Flat and Intact] [3:Flat and Intact] Wound Margin: [1:Large (67-100%)] [2:Large (67-100%)] [3:Large (67-100%)] Granulation A mount: [1:Pink] [2:Red, Pink] [3:Pink] Granulation Quality: [1:Small (1-33%)] [2:Small (1-33%)] [3:Small (1-33%)] Necrotic A mount: [1:Fat Layer (Subcutaneous Tissue): Yes Fat Layer (Subcutaneous Tissue): Yes Fat  Layer (Subcutaneous Tissue): Yes] Exposed Structures: [1:Fascia: No Tendon: No Muscle: No Joint: No Bone: No Small (1-33%)] [2:Fascia: No Tendon: No Muscle: No Joint: No Bone: No Small (1-33%)] [3:Fascia: No Tendon: No Muscle: No Joint: No Bone: No None] Wound Number: 4 N/A N/A Photos: N/A N/A Right, Lateral Knee N/A N/A Wound Location: Pressure Injury N/A N/A Wounding Event: Pressure Ulcer N/A N/A Primary Etiology: Arrhythmia N/A N/A Comorbid History: 01/26/2021 N/A N/A Date Acquired: 2 N/A N/A Weeks of Treatment: Open N/A N/A Wound Status: No N/A N/A Wound Recurrence: 0.5x0.3x0.2 N/A N/A Measurements L x W x D (cm) 0.118 N/A N/A A (cm) : rea 0.024 N/A N/A Volume (cm) : 74.90% N/A N/A % Reduction in A rea: 48.90% N/A N/A % Reduction in Volume: No N/A N/A Tunneling: No N/A N/A Undermining: Category/Stage II N/A N/A Classification: Medium N/A N/A Exudate A mount: Serosanguineous N/A N/A Exudate Type: red, brown N/A N/A Exudate Color: Flat and Intact N/A N/A Wound Margin: Large (67-100%) N/A N/A Granulation A mount: Red N/A N/A Granulation Quality: None Present (0%) N/A N/A Necrotic A mount: Fat Layer (Subcutaneous Tissue):  Yes N/A N/A Exposed Structures: Fascia: No Tendon: No Muscle: No Joint: No Bone: No Medium (34-66%) N/A N/A Epithelialization: Treatment Notes Electronic Signature(s) Signed: 05/03/2021 12:44:34 PM By: [name] DO Signed: 05/03/2021 5:31:57 PM By: [name] Entered By: [name] on 05/03/2021 12:35:08 -------------------------------------------------------------------------------- Multi-Disciplinary Care Plan Details Patient Name: Date of Service: [name], [name] 05/03/2021 10:30 A M Medical Record Number: [id number] Patient Account Number: [id number] Date of Birth/Sex: Treating RN: September 20, 1950 (71 y.o. [name] Primary Care Teretha Chalupa: Other Clinician: Referring Erlene Devita: Treating Karsin Pesta/Extender: Joanie Coddington in Treatment: 2 Multidisciplinary Care Plan reviewed with physician Active Inactive Orientation to the Wound Care Program Nursing Diagnoses: Knowledge deficit related to the wound healing center program Goals: Patient/caregiver will verbalize understanding of the [facility] Program Date Initiated: 04/19/2021 Target Resolution Date: 05/17/2021 Goal Status: Active Interventions: Provide education on orientation to the wound center Notes: Wound/Skin Impairment Nursing Diagnoses: Impaired tissue integrity Knowledge deficit related to ulceration/compromised skin integrity Goals: Patient/caregiver will verbalize understanding of skin care regimen Date Initiated: 05/03/2021 Target Resolution Date: 05/21/2021 Goal Status: Active Ulcer/skin breakdown will have a volume reduction of 30% by week 4 Date Initiated: 05/03/2021 Target Resolution Date: 05/21/2021 Goal Status: Active Interventions: Assess patient/caregiver ability to obtain necessary supplies Assess patient/caregiver ability to perform ulcer/skin care regimen upon admission and as needed Assess ulceration(s) every visit Provide education on ulcer and  skin care Notes: Electronic Signature(s) Signed: 05/03/2021 5:37:03 PM By: Levan Hurst RN, BSN Entered By: Levan Hurst on 05/03/2021 14:00:52 -------------------------------------------------------------------------------- Pain Assessment Details Patient Name: Date of Service: [name] V ID 05/03/2021 10:30 A M Medical Record Number: [id number] Patient Account Number: [id number] Date of Birth/Sex: Treating RN: 04-02-1950 (71 y.o. [name] Primary Care Makari Portman: Other Clinician: Referring Edmundo Tedesco: Treating Terrica Duecker/Extender: Joanie Coddington in Treatment: 2 Active Problems Location of Pain Severity and Description of Pain Patient Has Paino Yes Site Locations Pain Location: Pain in Ulcers With Dressing Change: Yes Duration of the Pain. Constant / Intermittento Intermittent Rate the pain. Current Pain Level: 8 Character of Pain Describe the Pain: Throbbing Pain Management and Medication Current Pain Management: Medication: Yes Cold Application: No Rest: No Massage: No Activity: No T.E.N.S.: No Heat Application: No Leg drop or elevation: No Is the Current Pain Management Adequate: Adequate How does your wound impact your  activities of daily livingo Sleep: No Bathing: No Appetite: No Relationship With Others: No Bladder Continence: No Emotions: No Bowel Continence: No Work: No Toileting: No Drive: No Dressing: No Hobbies: No Electronic Signature(s) Signed: 05/03/2021 5:37:03 PM By: Levan Hurst RN, BSN Entered By: Levan Hurst on 05/03/2021 10:45:19 -------------------------------------------------------------------------------- Patient/Caregiver Education Details Patient Name: Date of Service: [name] ID 2/13/2023andnbsp10:30 [place] Record Number: [id number] Patient Account Number: [id number] Date of Birth/Gender: Treating RN: [birth year] (71 y.o. [name] Primary Care Physician: Other  Clinician: Referring Physician: Treating Physician/Extender: Joanie Coddington in Treatment: 2 Education Assessment Education Provided To: Patient Education Topics Provided Wound/Skin Impairment: Methods: Explain/Verbal Responses: State content correctly Electronic Signature(s) Signed: 05/03/2021 5:37:03 PM By: Levan Hurst RN, BSN Entered By: Levan Hurst on 05/03/2021 14:01:07 -------------------------------------------------------------------------------- Wound Assessment Details Patient Name: Date of Service: [name] V ID 05/03/2021 10:30 A M Medical Record Number: [id number] Patient Account Number: [id number] Date of Birth/Sex: Treating RN: 1951/01/11 (71 y.o. [name] Primary Care Aurelia Gras: Other Clinician: Referring Louellen Haldeman: Treating Anthonymichael Munday/Extender: Joanie Coddington in Treatment: 2 Wound Status Wound Number: 1 Primary Etiology: Pressure Ulcer Wound Location: Sacrum Wound Status: Open Wounding Event: Pressure Injury Comorbid History: Arrhythmia Date Acquired: 01/26/2021 Weeks Of Treatment: 2 Clustered Wound: No Photos Wound Measurements Length: (cm) 2.4 Width: (cm) 1 Depth: (cm) 0.9 Area: (cm) 1.885 Volume: (cm) 1.696 % Reduction in Area: 40% % Reduction in Volume: 32.5% Epithelialization: Small (1-33%) Tunneling: No Undermining: Yes Starting Position (o'clock): 12 Ending Position (o'clock): 4 Maximum Distance: (cm) 1.4 Wound Description Classification: Category/Stage III Wound Margin: Well defined, not attached Exudate Amount: Medium Exudate Type: Serosanguineous Exudate Color: red, brown Foul Odor After Cleansing: No Slough/Fibrino Yes Wound Bed Granulation Amount: Large (67-100%) Exposed Structure Granulation Quality: Pink Fascia Exposed: No Necrotic Amount: Small (1-33%) Fat Layer (Subcutaneous Tissue) Exposed: Yes Necrotic Quality: Adherent Slough Tendon Exposed: No Muscle  Exposed: No Joint Exposed: No Bone Exposed: No Treatment Notes Wound #1 (Sacrum) Cleanser Wound Cleanser Discharge Instruction: Cleanse the wound with wound cleanser prior to applying a clean dressing using gauze sponges, not tissue or cotton balls. [employer] Ancillary Kit - 15 Day Supply Discharge Instruction: Use supplies as instructed; Kit contains: (15) Saline Bullets; (15) 3x3 Gauze; 15 pr Gloves Peri-Wound Care Topical Primary Dressing Dakin's Solution 0.125%, 16 (oz) Discharge Instruction: Moisten gauze with Dakin's solution Secondary Dressing Woven Gauze Sponges 2x2 in Discharge Instruction: Apply over primary dressing as directed. Bordered Gauze, 4x4 in Discharge Instruction: Apply over primary dressing as directed. Secured With Compression Wrap Compression Stockings Environmental education officer) Signed: 05/03/2021 5:37:03 PM By: Levan Hurst RN, BSN Entered By: Levan Hurst on 05/03/2021 11:05:49 -------------------------------------------------------------------------------- Wound Assessment Details Patient Name: Date of Service: [name], [name] 05/03/2021 10:30 A M Medical Record Number: [id number] Patient Account Number: [id number] Date of Birth/Sex: Treating RN: 1951/01/30 (71 y.o. [name] Primary Care Manjinder Breau: Other Clinician: Referring Shavonta Gossen: Treating Emma Schupp/Extender: Joanie Coddington in Treatment: 2 Wound Status Wound Number: 2 Primary Etiology: Pressure Ulcer Wound Location: Right Trochanter Wound Status: Open Wounding Event: Pressure Injury Comorbid History: Arrhythmia Date Acquired: 01/26/2021 Weeks Of Treatment: 2 Clustered Wound: No Photos Wound Measurements Length: (cm) 10.9 Width: (cm) 4.7 Depth: (cm) 0.2 Area: (cm) 40.236 Volume: (cm) 8.047 % Reduction in Area: 27% % Reduction in Volume: 27% Epithelialization: Small (1-33%) Tunneling: No Undermining: No Wound Description Classification:  Category/Stage III Wound Margin: Flat and Intact Exudate Amount: Medium  Exudate Type: Serosanguineous Exudate Color: red, brown Foul Odor After Cleansing: No Slough/Fibrino Yes Wound Bed Granulation Amount: Large (67-100%) Exposed Structure Granulation Quality: Red, Pink Fascia Exposed: No Necrotic Amount: Small (1-33%) Fat Layer (Subcutaneous Tissue) Exposed: Yes Necrotic Quality: Adherent Slough Tendon Exposed: No Muscle Exposed: No Joint Exposed: No Bone Exposed: No Treatment Notes Wound #2 (Trochanter) Wound Laterality: Right Cleanser Wound Cleanser Discharge Instruction: Cleanse the wound with wound cleanser prior to applying a clean dressing using gauze sponges, not tissue or cotton balls. Peri-Wound Care Topical Primary Dressing Hydrofera Blue Ready Foam, 4x5 in Discharge Instruction: Apply to wound bed as instructed Secondary Dressing Woven Gauze Sponge, Non-Sterile 4x4 in Discharge Instruction: Apply over primary dressing as directed. ABD Pad, 8x10 Discharge Instruction: Apply over primary dressing as directed. Secured With 83M Medipore H Soft Cloth Surgical T ape, 4 x 10 (in/yd) Discharge Instruction: Secure with tape as directed. Compression Wrap Compression Stockings Add-Ons Electronic Signature(s) Signed: 05/03/2021 5:37:03 PM By: Levan Hurst RN, BSN Entered By: Levan Hurst on 05/03/2021 11:04:38 -------------------------------------------------------------------------------- Wound Assessment Details Patient Name: Date of Service: [name], [name] 05/03/2021 10:30 A M Medical Record Number: [id number] Patient Account Number: [id number] Date of Birth/Sex: Treating RN: February 06, 1951 (71 y.o. [name] Primary Care Rheda Kassab: Other Clinician: Referring Renji Berwick: Treating Surafel Hilleary/Extender: Joanie Coddington in Treatment: 2 Wound Status Wound Number: 3 Primary Etiology: Pressure Ulcer Wound Location: Right Gluteus Wound  Status: Open Wounding Event: Pressure Injury Comorbid History: Arrhythmia Date Acquired: 01/26/2021 Weeks Of Treatment: 2 Clustered Wound: No Photos Wound Measurements Length: (cm) 1 Width: (cm) 0.5 Depth: (cm) 1.2 Area: (cm) 0.393 Volume: (cm) 0.471 % Reduction in Area: 85.4% % Reduction in Volume: 84.1% Epithelialization: None Tunneling: Yes Position (o'clock): 12 Maximum Distance: (cm) 1.5 Undermining: No Wound Description Classification: Category/Stage III Wound Margin: Flat and Intact Exudate Amount: Medium Exudate Type: Serosanguineous Exudate Color: red, brown Foul Odor After Cleansing: No Slough/Fibrino Yes Wound Bed Granulation Amount: Large (67-100%) Exposed Structure Granulation Quality: Pink Fascia Exposed: No Necrotic Amount: Small (1-33%) Fat Layer (Subcutaneous Tissue) Exposed: Yes Necrotic Quality: Adherent Slough Tendon Exposed: No Muscle Exposed: No Joint Exposed: No Bone Exposed: No Treatment Notes Wound #3 (Gluteus) Wound Laterality: Right Cleanser Wound Cleanser Discharge Instruction: Cleanse the wound with wound cleanser prior to applying a clean dressing using gauze sponges, not tissue or cotton balls. Peri-Wound Care Topical Primary Dressing Dakin's Solution 0.125%, 16 (oz) Discharge Instruction: Moisten gauze with Dakin's solution Secondary Dressing Bordered Gauze, 2x2 in Discharge Instruction: Apply over primary dressing as directed. Secured With Compression Wrap Compression Stockings Environmental education officer) Signed: 05/03/2021 5:37:03 PM By: Levan Hurst RN, BSN Entered By: Levan Hurst on 05/03/2021 11:06:47 -------------------------------------------------------------------------------- Wound Assessment Details Patient Name: Date of Service: [name], [name] 05/03/2021 10:30 A M Medical Record Number: [id number] Patient Account Number: [id number] Date of Birth/Sex: Treating RN: 1950-08-30 (71 y.o. [name] Primary Care Vannary Greening: Other Clinician: Referring [name]: Treating Jacqualyn Sedgwick/Extender: Joanie Coddington in Treatment: 2 Wound Status Wound Number: 4 Primary Etiology: Pressure Ulcer Wound Location: Right, Lateral Knee Wound Status: Open Wounding Event: Pressure Injury Comorbid History: Arrhythmia Date Acquired: 01/26/2021 Weeks Of Treatment: 2 Clustered Wound: No Photos Wound Measurements Length: (cm) 0.5 Width: (cm) 0.3 Depth: (cm) 0.2 Area: (cm) 0.118 Volume: (cm) 0.024 % Reduction in Area: 74.9% % Reduction in Volume: 48.9% Epithelialization: Medium (34-66%) Tunneling: No Undermining: No Wound Description Classification: Category/Stage II Wound Margin: Flat and Intact Exudate Amount: Medium  Exudate Type: Serosanguineous Exudate Color: red, brown Foul Odor After Cleansing: No Slough/Fibrino No Wound Bed Granulation Amount: Large (67-100%) Exposed Structure Granulation Quality: Red Fascia Exposed: No Necrotic Amount: None Present (0%) Fat Layer (Subcutaneous Tissue) Exposed: Yes Tendon Exposed: No Muscle Exposed: No Joint Exposed: No Bone Exposed: No Treatment Notes Wound #4 (Knee) Wound Laterality: Right, Lateral Cleanser Wound Cleanser Discharge Instruction: Cleanse the wound with wound cleanser prior to applying a clean dressing using gauze sponges, not tissue or cotton balls. Peri-Wound Care Topical Primary Dressing Hydrofera Blue Ready Foam, 2.5 x2.5 in Discharge Instruction: Apply to wound bed as instructed Secondary Dressing Bordered Gauze, 2x2 in Discharge Instruction: Apply over primary dressing as directed. Secured With Compression Wrap Compression Stockings Environmental education officer) Signed: 05/03/2021 5:37:03 PM By: Levan Hurst RN, BSN Entered By: Levan Hurst on 05/03/2021 11:07:34 -------------------------------------------------------------------------------- Vitals Details Patient  Name: Date of Service: [name], [name] 05/03/2021 10:30 A M Medical Record Number: [id number] Patient Account Number: [id number] Date of Birth/Sex: Treating RN: 01-21-1951 (71 y.o. [name] Primary Care Jamari Diana: Other Clinician: Referring Jevaeh Shams: Treating Gryffin Altice/Extender: Joanie Coddington in Treatment: 2 Vital Signs Time Taken: 10:42 Temperature (F): 97.5 Height (in): 73 Pulse (bpm): 67 Weight (lbs): 147 Respiratory Rate (breaths/min): 18 Body Mass Index (BMI): 19.4 Blood Pressure (mmHg): 154/98 Reference Range: 80 - 120 mg / dl Electronic Signature(s) Signed: 05/03/2021 5:37:03 PM By: Levan Hurst RN, BSN Entered By: Levan Hurst on 05/03/2021 10:44:59

## 2021-05-03 NOTE — Progress Notes (Signed)
[name] ([id number]) Visit Report for 05/03/2021 Chief Complaint Document Details Patient Name: Date of Service: [name] ID 05/03/2021 10:30 A M Medical Record Number: [id number] Patient Account Number: [id number] Date of Birth/Sex: Treating RN: [birth year] (71 y.o. [name] Primary Care Provider: Other Clinician: Referring Provider: Treating Provider/Extender: [name] in Treatment: 2 Information Obtained from: Patient Chief Complaint Multiple pressure ulcers to the sacrum, buttocks and knee Electronic Signature(s) Signed: 05/03/2021 12:44:34 PM By: Leonard Shan DO Entered By: [name] on 05/03/2021 12:35:21 -------------------------------------------------------------------------------- HPI Details Patient Name: Date of Service: [name] ID 05/03/2021 10:30 A M Medical Record Number: [id number] Patient Account Number: [id number] Date of Birth/Sex: Treating RN: [birth year] (71 y.o. [name] Primary Care Provider: Other Clinician: Referring Provider: Treating Provider/Extender: [name] in Treatment: 2 History of Present Illness HPI Description: Admission 04/19/2021 [name] is a 71 year old male with a past medical history of end-stage renal disease on hemodialysis, Substance abuse and hep C that presents to the clinic for multiple pressure ulcers to his bottom, sacrum and right knee. His daughter is present and helps provide the history. [place] and developed pressure injuries. He declined going to a skilled nursing facility and had not been properly managing the wound beds. He ended up being hospitalized on 12/27/2020 for sepsis secondary to wound infections. He did require intubation for respiratory support. Since then he has moved to live with his daughter who helps take care of the wounds. She has been using calcium alginate to the wound beds.  He currently denies signs of infection. 2/13; patient presents for follow-up. He has been using Hydrofera Blue to the right hip wound and right knee wound and Dakin's to the sacral and gluteus wound. He denies signs of infection. He has a hard time offloading the wound beds however does try to do so. Please no issues or complaints today. Electronic Signature(s) Signed: 05/03/2021 12:44:34 PM By: Leonard Shan DO Entered By: [name] on 05/03/2021 12:36:38 -------------------------------------------------------------------------------- Physical Exam Details Patient Name: Date of Service: [name] ID 05/03/2021 10:30 A M Medical Record Number: [id number] Patient Account Number: [id number] Date of Birth/Sex: Treating RN: 1950/04/05 (71 y.o. [name] Primary Care Provider: Other Clinician: Referring Provider: Treating Provider/Extender: [name] in Treatment: 2 Constitutional respirations regular, non-labored and within target range for patient.Marland Kitchen Psychiatric pleasant and cooperative. Notes T the sacrum there is a small open wound with some scant nonviable tissue and granulation tissue present. T the right hip there is a large open wound with o o granulation tissue present. No signs of surrounding infection. T the right ischium there is a small wound that tunnels. Right lateral knee: Open wound with o granulation tissue. No surrounding signs of infection. Electronic Signature(s) Signed: 05/03/2021 12:44:34 PM By: Leonard Shan DO Entered By: [name] on 05/03/2021 12:38:00 -------------------------------------------------------------------------------- Physician Orders Details Patient Name: Date of Service: [name] ID 05/03/2021 10:30 A M Medical Record Number: [id number] Patient Account Number: [id number] Date of Birth/Sex: Treating RN: [birth year] (71 y.o. [name] Primary Care Provider: Other  Clinician: Referring Provider: Treating Provider/Extender: [name] in Treatment: 2 Verbal / Phone Orders: No Diagnosis Coding ICD-10 Coding Code Description L89.153 Pressure ulcer of sacral region, stage 3 L89.313 Pressure ulcer of right buttock, stage 3 L89.892 Pressure ulcer of other site, stage 2 N18.6 End stage  renal disease Follow-up Appointments ppointment in 2 weeks. - Dr [name] Return A Off-Loading Low air-loss mattress (Group 2) Turn and reposition every 2 hours Wound Treatment Wound #1 - Sacrum Cleanser: Wound Cleanser 1 x Per Day/15 Days Discharge Instructions: Cleanse the wound with wound cleanser prior to applying a clean dressing using gauze sponges, not tissue or cotton balls. Cleanser: Byram Ancillary Kit - 15 Day Supply (DME) (Generic) 1 x Per Day/15 Days Discharge Instructions: Use supplies as instructed; Kit contains: (15) Saline Bullets; (15) 3x3 Gauze; 15 pr Gloves Prim Dressing: Dakin's Solution 0.125%, 16 (oz) 1 x Per Day/15 Days ary Discharge Instructions: Moisten gauze with Dakin's solution Secondary Dressing: Woven Gauze Sponges 2x2 in (DME) (Generic) 1 x Per Day/15 Days Discharge Instructions: Apply over primary dressing as directed. Secondary Dressing: Bordered Gauze, 4x4 in (DME) (Generic) 1 x Per Day/15 Days Discharge Instructions: Apply over primary dressing as directed. Wound #2 - Trochanter Wound Laterality: Right Cleanser: Wound Cleanser 1 x Per Day/15 Days Discharge Instructions: Cleanse the wound with wound cleanser prior to applying a clean dressing using gauze sponges, not tissue or cotton balls. Prim Dressing: Hydrofera Blue Ready Foam, 4x5 in (DME) (Generic) 1 x Per Day/15 Days ary Discharge Instructions: Apply to wound bed as instructed Secondary Dressing: Woven Gauze Sponge, Non-Sterile 4x4 in (DME) (Generic) 1 x Per Day/15 Days Discharge Instructions: Apply over primary dressing as directed. Secondary  Dressing: ABD Pad, 8x10 (DME) (Generic) 1 x Per Day/15 Days Discharge Instructions: Apply over primary dressing as directed. Secured With: 58M Medipore H Soft Cloth Surgical T ape, 4 x 10 (in/yd) (DME) (Generic) 1 x Per Day/15 Days Discharge Instructions: Secure with tape as directed. Wound #3 - Gluteus Wound Laterality: Right Cleanser: Wound Cleanser 1 x Per Day/15 Days Discharge Instructions: Cleanse the wound with wound cleanser prior to applying a clean dressing using gauze sponges, not tissue or cotton balls. Prim Dressing: Dakin's Solution 0.125%, 16 (oz) 1 x Per Day/15 Days ary Discharge Instructions: Moisten gauze with Dakin's solution Secondary Dressing: Bordered Gauze, 2x2 in (DME) (Generic) 1 x Per Day/15 Days Discharge Instructions: Apply over primary dressing as directed. Wound #4 - Knee Wound Laterality: Right, Lateral Cleanser: Wound Cleanser 1 x Per Day/15 Days Discharge Instructions: Cleanse the wound with wound cleanser prior to applying a clean dressing using gauze sponges, not tissue or cotton balls. Prim Dressing: Hydrofera Blue Ready Foam, 2.5 x2.5 in 1 x Per Day/15 Days ary Discharge Instructions: Apply to wound bed as instructed Secondary Dressing: Bordered Gauze, 2x2 in (DME) (Generic) 1 x Per Day/15 Days Discharge Instructions: Apply over primary dressing as directed. Radiology X-ray, pelvis, 2 views - Pressure ulcers on right gluteus and sacrum - (ICD10 L89.153 - Pressure ulcer of sacral region, stage 3) Electronic Signature(s) Signed: 05/03/2021 12:44:34 PM By: Leonard Shan DO Entered By: [name] on 05/03/2021 12:38:28 Prescription 05/03/2021 -------------------------------------------------------------------------------- Docia Chuck DO Patient Name: Provider: 14-Jun-1950 [id number] Date of Birth: NPI#Jerilynn Mages [id number] Sex: DEA #: [phone] Phone #: License #: [place] Patient  Address: [address] [place], [place] [address] [place], [place] [address] [phone] Allergies No Known Allergies Provider's Orders X-ray, pelvis, 2 views - ICD10: L89.153 - Pressure ulcers on right gluteus and sacrum Hand Signature: Date(s): Electronic Signature(s) Signed: 05/03/2021 12:44:34 PM By: Leonard Shan DO Entered By: [name] on 05/03/2021 12:38:28 -------------------------------------------------------------------------------- Problem List Details Patient Name: Date of Service: [name]  ID 05/03/2021 10:30 A M Medical Record Number: [id number] Patient Account Number: [id number] Date of Birth/Sex: Treating RN: 11-08-1950 (71 y.o. [name] Primary Care Provider: Other Clinician: Referring Provider: Treating Provider/Extender: [name] in Treatment: 2 Active Problems ICD-10 Encounter Code Description Active Date MDM Diagnosis L89.153 Pressure ulcer of sacral region, stage 3 04/19/2021 No Yes L89.313 Pressure ulcer of right buttock, stage 3 04/19/2021 No Yes L89.892 Pressure ulcer of other site, stage 2 04/19/2021 No Yes N18.6 End stage renal disease 04/19/2021 No Yes Inactive Problems Resolved Problems Electronic Signature(s) Signed: 05/03/2021 12:44:34 PM By: Leonard Shan DO Entered By: [name] on 05/03/2021 12:34:59 -------------------------------------------------------------------------------- Progress Note Details Patient Name: Date of Service: [name] ID 05/03/2021 10:30 A M Medical Record Number: [id number] Patient Account Number: [id number] Date of Birth/Sex: Treating RN: 1950-04-24 (71 y.o. [name] Primary Care Provider: Other Clinician: Referring Provider: Treating Provider/Extender: [name] in Treatment: 2 Subjective Chief Complaint Information obtained from Patient Multiple pressure ulcers to the  sacrum, buttocks and knee History of Present Illness (HPI) Admission 04/19/2021 [name] is a 71 year old male with a past medical history of end-stage renal disease on hemodialysis, Substance abuse and hep C that presents to the clinic for multiple pressure ulcers to his bottom, sacrum and right knee. His daughter is present and helps provide the history. [place] living out of state when he fell and developed pressure injuries. He declined going to a skilled nursing facility and had not been properly managing the wound beds. He ended up being hospitalized on 12/27/2020 for sepsis secondary to wound infections. He did require intubation for respiratory support. Since then he has moved to live with his daughter who helps take care of the wounds. She has been using calcium alginate to the wound beds. He currently denies signs of infection. 2/13; patient presents for follow-up. He has been using Hydrofera Blue to the right hip wound and right knee wound and Dakin's to the sacral and gluteus wound. He denies signs of infection. He has a hard time offloading the wound beds however does try to do so. Please no issues or complaints today. Patient History Information obtained from Patient. Family History Unknown History. Social History Former smoker - Quit Nov 2022, Alcohol Use - Never, Drug Use - Prior History - Marijuana, Caffeine Use - Never. Medical History Cardiovascular Patient has history of Arrhythmia - A.Fib Denies history of Angina, Congestive Heart Failure, Coronary Artery Disease, Deep Vein Thrombosis, Hypertension, Hypotension, Myocardial Infarction, Peripheral Arterial Disease, Peripheral Venous Disease, Phlebitis, Vasculitis Genitourinary Denies history of End Stage Renal Disease Integumentary (Skin) Denies history of History of Burn Hospitalization/Surgery History - Right hip Repair (most recent Nov.2022). Medical A Surgical History Notes nd Respiratory Pt. was on the  ventilator-wears oxygen as needed Genitourinary Pt. is on Dialysis Tues,Thurs,Sat Objective Constitutional respirations regular, non-labored and within target range for patient.. Vitals Time Taken: 10:42 AM, Height: 73 in, Weight: 147 lbs, BMI: 19.4, Temperature: 97.5 F, Pulse: 67 bpm, Respiratory Rate: 18 breaths/min, Blood Pressure: 154/98 mmHg. Psychiatric pleasant and cooperative. General Notes: T the sacrum there is a small open wound with some scant nonviable tissue and granulation tissue present. T the right hip there is a large o o open wound with granulation tissue present. No signs of surrounding infection. T the right ischium there is a small wound that tunnels. Right lateral knee: Open o wound with granulation tissue. No surrounding signs of infection. Integumentary (  Hair, Skin) Wound #1 status is Open. Original cause of wound was Pressure Injury. The date acquired was: 01/26/2021. The wound has been in treatment 2 weeks. The wound is located on the Sacrum. The wound measures 2.4cm length x 1cm width x 0.9cm depth; 1.885cm^2 area and 1.696cm^3 volume. There is Fat Layer (Subcutaneous Tissue) exposed. There is no tunneling noted, however, there is undermining starting at 12:00 and ending at 4:00 with a maximum distance of 1.4cm. There is a medium amount of serosanguineous drainage noted. The wound margin is well defined and not attached to the wound base. There is large (67- 100%) pink granulation within the wound bed. There is a small (1-33%) amount of necrotic tissue within the wound bed including Adherent Slough. Wound #2 status is Open. Original cause of wound was Pressure Injury. The date acquired was: 01/26/2021. The wound has been in treatment 2 weeks. The wound is located on the Right Trochanter. The wound measures 10.9cm length x 4.7cm width x 0.2cm depth; 40.236cm^2 area and 8.047cm^3 volume. There is Fat Layer (Subcutaneous Tissue) exposed. There is no tunneling or  undermining noted. There is a medium amount of serosanguineous drainage noted. The wound margin is flat and intact. There is large (67-100%) red, pink granulation within the wound bed. There is a small (1-33%) amount of necrotic tissue within the wound bed including Adherent Slough. Wound #3 status is Open. Original cause of wound was Pressure Injury. The date acquired was: 01/26/2021. The wound has been in treatment 2 weeks. The wound is located on the Right Gluteus. The wound measures 1cm length x 0.5cm width x 1.2cm depth; 0.393cm^2 area and 0.471cm^3 volume. There is Fat Layer (Subcutaneous Tissue) exposed. There is no undermining noted, however, there is tunneling at 12:00 with a maximum distance of 1.5cm. There is a medium amount of serosanguineous drainage noted. The wound margin is flat and intact. There is large (67-100%) pink granulation within the wound bed. There is a small (1-33%) amount of necrotic tissue within the wound bed including Adherent Slough. Wound #4 status is Open. Original cause of wound was Pressure Injury. The date acquired was: 01/26/2021. The wound has been in treatment 2 weeks. The wound is located on the Right,Lateral Knee. The wound measures 0.5cm length x 0.3cm width x 0.2cm depth; 0.118cm^2 area and 0.024cm^3 volume. There is Fat Layer (Subcutaneous Tissue) exposed. There is no tunneling or undermining noted. There is a medium amount of serosanguineous drainage noted. The wound margin is flat and intact. There is large (67-100%) red granulation within the wound bed. There is no necrotic tissue within the wound bed. Assessment Active Problems ICD-10 Pressure ulcer of sacral region, stage 3 Pressure ulcer of right buttock, stage 3 Pressure ulcer of other site, stage 2 End stage renal disease Patient's wounds have shown improvement in size and appearance since last clinic visit. No signs of surrounding infection. I recommended current therapy of Hydrofera Blue to  the knee and right hip wound along with Dakin's wet-to-dry dressings to the sacral wound and gluteus wound. Due to the chronicity of the wounds I would like to obtain an x-ray of the sacral region. We also discussed the importance of continuing to aggressively offload the wound beds for healing. Follow-up in 2 weeks. Plan Follow-up Appointments: Return Appointment in 2 weeks. - Dr Leonard [place] Off-Loading: Low air-loss mattress (Group 2) Turn and reposition every 2 hours Radiology ordered were: X-ray, pelvis, 2 views - Pressure ulcers on right gluteus and sacrum WOUND #1: -  Sacrum Wound Laterality: Cleanser: Wound Cleanser 1 x Per Day/15 Days Discharge Instructions: Cleanse the wound with wound cleanser prior to applying a clean dressing using gauze sponges, not tissue or cotton balls. Cleanser: [place]ram Ancillary Kit - 15 Day Supply (DME) (Generic) 1 x Per Day/15 Days Discharge Instructions: Use supplies as instructed; Kit contains: (15) Saline Bullets; (15) 3x3 Gauze; 15 pr Gloves Prim Dressing: Dakin's Solution 0.125%, 16 (oz) 1 x Per Day/15 Days ary Discharge Instructions: Moisten gauze with Dakin's solution Secondary Dressing: Woven Gauze Sponges 2x2 in (DME) (Generic) 1 x Per Day/15 Days Discharge Instructions: Apply over primary dressing as directed. Secondary Dressing: Bordered Gauze, 4x4 in (DME) (Generic) 1 x Per Day/15 Days Discharge Instructions: Apply over primary dressing as directed. WOUND #2: - Trochanter Wound Laterality: Right Cleanser: Wound Cleanser 1 x Per YOV/78 Days Discharge Instructions: Cleanse the wound with wound cleanser prior to applying a clean dressing using gauze sponges, not tissue or cotton balls. Prim Dressing: Hydrofera Blue Ready Foam, 4x5 in (DME) (Generic) 1 x Per Day/15 Days ary Discharge Instructions: Apply to wound bed as instructed Secondary Dressing: Woven Gauze Sponge, Non-Sterile 4x4 in (DME) (Generic) 1 x Per Day/15 Days Discharge Instructions:  Apply over primary dressing as directed. Secondary Dressing: ABD Pad, 8x10 (DME) (Generic) 1 x Per Day/15 Days Discharge Instructions: Apply over primary dressing as directed. Secured With: 40M Medipore H Soft Cloth Surgical T ape, 4 x 10 (in/yd) (DME) (Generic) 1 x Per Day/15 Days Discharge Instructions: Secure with tape as directed. WOUND #3: - Gluteus Wound Laterality: Right Cleanser: Wound Cleanser 1 x Per Day/15 Days Discharge Instructions: Cleanse the wound with wound cleanser prior to applying a clean dressing using gauze sponges, not tissue or cotton balls. Prim Dressing: Dakin's Solution 0.125%, 16 (oz) 1 x Per Day/15 Days ary Discharge Instructions: Moisten gauze with Dakin's solution Secondary Dressing: Bordered Gauze, 2x2 in (DME) (Generic) 1 x Per Day/15 Days Discharge Instructions: Apply over primary dressing as directed. WOUND #4: - Knee Wound Laterality: Right, Lateral Cleanser: Wound Cleanser 1 x Per Day/15 Days Discharge Instructions: Cleanse the wound with wound cleanser prior to applying a clean dressing using gauze sponges, not tissue or cotton balls. Prim Dressing: Hydrofera Blue Ready Foam, 2.5 x2.5 in 1 x Per Day/15 Days ary Discharge Instructions: Apply to wound bed as instructed Secondary Dressing: Bordered Gauze, 2x2 in (DME) (Generic) 1 x Per Day/15 Days Discharge Instructions: Apply over primary dressing as directed. 1. Aggressive offloading 2. Hydrofera Blue 3. Dakin's wet-to-dry 4. X-ray of pelvis 5. Follow-up in 2 weeks Electronic Signature(s) Signed: 05/03/2021 12:44:34 PM By: Leonard Shan DO Entered By: [name] on 05/03/2021 12:43:54 -------------------------------------------------------------------------------- HxROS Details Patient Name: Date of Service: [name] ID 05/03/2021 10:30 A M Medical Record Number: [id number] Patient Account Number: [id number] Date of Birth/Sex: Treating RN: 1950-04-23 (71 y.o. [name] Primary  Care Provider: Other Clinician: Referring Provider: Treating Provider/Extender: [name] in Treatment: 2 Information Obtained From Patient Respiratory Medical History: Past Medical History Notes: Pt. was on the ventilator-wears oxygen as needed Cardiovascular Medical History: Positive for: Arrhythmia - A.Fib Negative for: Angina; Congestive Heart Failure; Coronary Artery Disease; Deep Vein Thrombosis; Hypertension; Hypotension; Myocardial Infarction; Peripheral Arterial Disease; Peripheral Venous Disease; Phlebitis; Vasculitis Genitourinary Medical History: Negative for: End Stage Renal Disease Past Medical History Notes: Pt. is on Dialysis Tues,Thurs,Sat Integumentary (Skin) Medical History: Negative for: History of Burn Immunizations Pneumococcal Vaccine: Received Pneumococcal Vaccination: No Implantable Devices Yes Hospitalization /  Surgery History Type of Hospitalization/Surgery Right hip Repair (most recent Nov.2022) Family and Social History Unknown History: Yes; Former smoker - Quit Nov 2022; Alcohol Use: Never; Drug Use: Prior History - Marijuana; Caffeine Use: Never; Financial Concerns: No; Food, Clothing or Shelter Needs: No; Support System Lacking: No; Transportation Concerns: No Electronic Signature(s) Signed: 05/03/2021 12:44:34 PM By: Leonard Shan DO Signed: 05/03/2021 5:31:57 PM By: [name] Entered By: [name] on 05/03/2021 12:36:47 -------------------------------------------------------------------------------- [place] Details Patient Name: Date of Service: [name] ID 05/03/2021 Medical Record Number: [id number] Patient Account Number: [id number] Date of Birth/Sex: Treating RN: Feb 09, 1951 (71 y.o. [name] Primary Care Provider: Other Clinician: Referring Provider: Treating Provider/Extender: [name] in Treatment: 2 Diagnosis Coding ICD-10 Codes Code  Description [id number] Pressure ulcer of sacral region, stage 3 L89.313 Pressure ulcer of right buttock, stage 3 L89.892 Pressure ulcer of other site, stage 2 N18.6 End stage renal disease Facility Procedures CPT4 Code: [id number] Description: [phone] - WOUND CARE VISIT-LEV 5 EST PT Modifier: Quantity: 1 Physician Procedures : CPT4 Code Description Modifier [id number] - WC PHYS LEVEL 3 - EST PT ICD-10 Diagnosis Description L89.153 Pressure ulcer of sacral region, stage 3 L89.313 Pressure ulcer of right buttock, stage 3 L89.892 Pressure ulcer of other site, stage 2 N18.6  End stage renal disease Quantity: 1 Electronic Signature(s) Signed: 05/03/2021 2:48:52 PM By: Leonard Shan DO Signed: 05/03/2021 5:37:03 PM By: Levan Hurst RN, BSN Previous Signature: 05/03/2021 12:44:34 PM Version By: Leonard Shan DO Entered By: Levan Hurst on 05/03/2021 14:15:56

## 2021-05-10 ENCOUNTER — Encounter (HOSPITAL_BASED_OUTPATIENT_CLINIC_OR_DEPARTMENT_OTHER): Payer: Medicare (Managed Care) | Admitting: General Surgery

## 2021-05-17 ENCOUNTER — Encounter (HOSPITAL_BASED_OUTPATIENT_CLINIC_OR_DEPARTMENT_OTHER): Payer: Medicare (Managed Care) | Admitting: Internal Medicine

## 2021-05-18 DIAGNOSIS — E46 Unspecified protein-calorie malnutrition: Secondary | ICD-10-CM | POA: Insufficient documentation

## 2021-05-24 ENCOUNTER — Encounter (HOSPITAL_BASED_OUTPATIENT_CLINIC_OR_DEPARTMENT_OTHER): Payer: Medicare (Managed Care) | Admitting: Internal Medicine

## 2021-05-25 ENCOUNTER — Ambulatory Visit (HOSPITAL_COMMUNITY)
Admission: RE | Admit: 2021-05-25 | Discharge: 2021-05-25 | Disposition: A | Discharge status: Home / Self Care | Payer: Medicare (Managed Care) | Source: Ambulatory Visit | Attending: Internal Medicine | Admitting: Internal Medicine

## 2021-05-25 ENCOUNTER — Other Ambulatory Visit (HOSPITAL_COMMUNITY): Payer: Self-pay | Admitting: Internal Medicine

## 2021-05-25 ENCOUNTER — Other Ambulatory Visit: Payer: Self-pay

## 2021-05-25 ENCOUNTER — Encounter (HOSPITAL_BASED_OUTPATIENT_CLINIC_OR_DEPARTMENT_OTHER): Payer: Medicare (Managed Care) | Attending: Internal Medicine | Admitting: Internal Medicine

## 2021-05-25 DIAGNOSIS — L89153 Pressure ulcer of sacral region, stage 3: Secondary | ICD-10-CM | POA: Diagnosis not present

## 2021-05-29 NOTE — Progress Notes (Unsigned)
Cardiology Office Note:    Date:  05/29/2021   ID:  [name], DOB 1950/08/02, MRN [id number]  PCP:  Traci Sermon, [place] Providers Cardiologist:  Lenna Sciara, MD Referring MD: Caren Macadam, MD   Chief Complaint/Reason for Referral: Atrial fibrillation with; establish cardiovascular care  ASSESSMENT:    Atrial fibrillation, unspecified type ([place])  ESRD (end stage renal disease) on dialysis [facility])  PLAN:    In order of problems listed above: 1.  We will start Eliquis 5mg  bid and check an echocardiogram.  Stop amiodarone and continue Coreg.  Follow-up in 6 months or earlier if needed. 2.  Continue current care.         {Are you ordering a CV Procedure (e.g. stress test, cath, DCCV, TEE, etc)?   Press F2        :[id number]}   Dispo:  No follow-ups on file.     Medication Adjustments/Labs and Tests Ordered: Current medicines are reviewed at length with the patient today.  Concerns regarding medicines are outlined above.   Tests Ordered: No orders of the defined types were placed in this encounter.   Medication Changes: No orders of the defined types were placed in this encounter.   History of Present Illness:    FOCUSED PROBLEM LIST:   1.  Atrial fibrillation with a CHA2DS2-VASc score of 2 2.  End-stage renal disease on hemodialysis   The patient is a 71 y.o. male with the indicated medical history here to establish cardiovascular care.  He had recently moved here from [place].  He experienced a long hospitalization being treated for sepsis.  This his primary care provider here recently.          Previous Medical History: Past Medical History:  Diagnosis Date   Edema    ESRD (end stage renal disease) ([place])    History of insertion of tunneled central venous catheter (CVC) with port    Hypotension      Current Medications: No outpatient medications have been marked as taking for the 06/01/21 encounter (Office Visit) with [name], MD.     Allergies:    Patient has no allergy information on record.   Social History:       Family Hx: No family history on file.   Review of Systems:   Please see the history of present illness.    All other systems reviewed and are negative.     EKGs/Labs/Other Test Reviewed:    EKG:  ***  Prior CV studies: None available  Imaging studies that I have independently reviewed today: None relevant  Recent Labs: Creatinine 4.7 potassium 4.2 sodium 140 Labs November 2022  Risk Assessment/Calculations:     CHA2DS2-VASc Score = 2  {Confirm score is correct.  If not, click here to update score.  REFRESH note.  :1} This indicates a 2.2% annual risk of stroke. The patient's score is based upon: CHF History: 0 HTN History: 1 Diabetes History: 0 Stroke History: 0 Vascular Disease History: 0 Age Score: 1 Gender Score: 0   {This patient has a significant risk of stroke if diagnosed with atrial fibrillation.  Please consider VKA or DOAC agent for anticoagulation if the bleeding risk is acceptable.   You can also use the SmartPhrase .[place] for documentation.   :[id number]}      Physical Exam:    VS:  There were no vitals taken for this visit.   Wt Readings from Last 3 Encounters:  No  data found for Wt    GENERAL:  No apparent distress, AOx3 HEENT:  No carotid bruits, +2 carotid impulses, no scleral icterus CAR: RRR Irregular RR*** no murmurs***, gallops, rubs, or thrills RES:  Clear to auscultation bilaterally ABD:  Soft, nontender, nondistended, positive bowel sounds x 4 VASC:  +2 radial pulses, +2 carotid pulses, palpable pedal pulses NEURO:  CN 2-12 grossly intact; motor and sensory grossly intact PSYCH:  No active depression or anxiety EXT:  No edema, ecchymosis, or cyanosis  Signed, [name], MD  05/29/2021 9:01 AM    [address], [place], [place]  [address] Phone: [phone]; Fax: [phone]    Note:  This document was prepared using Dragon voice recognition software and may include unintentional dictation errors.

## 2021-05-31 ENCOUNTER — Other Ambulatory Visit: Payer: Self-pay

## 2021-05-31 ENCOUNTER — Ambulatory Visit (HOSPITAL_COMMUNITY)
Admission: RE | Admit: 2021-05-31 | Discharge: 2021-05-31 | Disposition: A | Discharge status: Home / Self Care | Payer: Medicare (Managed Care) | Source: Ambulatory Visit | Attending: Vascular Surgery | Admitting: Vascular Surgery

## 2021-05-31 ENCOUNTER — Ambulatory Visit (INDEPENDENT_AMBULATORY_CARE_PROVIDER_SITE_OTHER)
Admission: RE | Admit: 2021-05-31 | Discharge: 2021-05-31 | Disposition: A | Discharge status: Home / Self Care | Payer: Medicare (Managed Care) | Source: Ambulatory Visit | Attending: Vascular Surgery | Admitting: Vascular Surgery

## 2021-05-31 DIAGNOSIS — N189 Chronic kidney disease, unspecified: Secondary | ICD-10-CM | POA: Insufficient documentation

## 2021-06-01 ENCOUNTER — Other Ambulatory Visit (HOSPITAL_COMMUNITY): Payer: Self-pay | Admitting: Nephrology

## 2021-06-01 ENCOUNTER — Encounter: Payer: Medicare (Managed Care) | Admitting: Vascular Surgery

## 2021-06-01 ENCOUNTER — Encounter: Payer: Self-pay | Admitting: Internal Medicine

## 2021-06-01 ENCOUNTER — Ambulatory Visit: Payer: Medicare (Managed Care) | Admitting: Internal Medicine

## 2021-06-01 VITALS — BP 126/80 | HR 71 | Ht 73.0 in | Wt 160.0 lb

## 2021-06-01 DIAGNOSIS — I4891 Unspecified atrial fibrillation: Secondary | ICD-10-CM | POA: Diagnosis not present

## 2021-06-01 DIAGNOSIS — Z992 Dependence on renal dialysis: Secondary | ICD-10-CM | POA: Diagnosis not present

## 2021-06-01 DIAGNOSIS — N186 End stage renal disease: Secondary | ICD-10-CM

## 2021-06-01 MED ORDER — APIXABAN 5 MG PO TABS: 5.0000 mg | ORAL_TABLET | Freq: Two times a day (BID) | ORAL | 11 refills | Status: AC

## 2021-06-01 MED ORDER — CARVEDILOL 12.5 MG PO TABS: 12.5000 mg | ORAL_TABLET | Freq: Two times a day (BID) | ORAL | 3 refills | Status: DC

## 2021-06-01 NOTE — Patient Instructions (Signed)
Medication Instructions:  ?1) Stop Amiodarone  ? ?2) Start apixaban (Eliquis) 5 mg twice a day  ? ?3) Increase Carvedilol (Coreg) to 12.5 mg twice a day  ? ?*If you need a refill on your cardiac medications before your next appointment, please call your pharmacy* ? ? ?Lab Work: ?None ordered  ? ?If you have labs (blood work) drawn today and your tests are completely normal, you will receive your results only by: ?MyChart Message (if you have MyChart) OR ?A paper copy in the mail ?If you have any lab test that is abnormal or we need to change your treatment, we will call you to review the results. ? ? ?Testing/Procedures: ?Your physician has requested that you have an echocardiogram. Echocardiography is a painless test that uses sound waves to create images of your heart. It provides your doctor with information about the size and shape of your heart and how well your heart?s chambers and valves are working. This procedure takes approximately one hour. There are no restrictions for this procedure. ? ? ? ?Follow-Up: ?At [facility], you and your health needs are our priority.  As part of our continuing mission to provide you with exceptional heart care, we have created designated Provider Care Teams.  These Care Teams include your primary Cardiologist (physician) and Advanced Practice Providers (APPs -  Physician Assistants and Nurse Practitioners) who all work together to provide you with the care you need, when you need it. ? ?We recommend signing up for the patient portal called "MyChart".  Sign up information is provided on this After Visit Summary.  MyChart is used to connect with patients for Virtual Visits (Telemedicine).  Patients are able to view lab/test results, encounter notes, upcoming appointments, etc.  Non-urgent messages can be sent to your provider as well.   ?To learn more about what you can do with MyChart, go to [email or web address].   ? ?Your next appointment:   ?6 month(s) ? ?The format  for your next appointment:   ?In Person ? ?Provider:   ?Dr. Lenna Sciara  ? ? ?Other Instructions ?None   ?

## 2021-06-03 ENCOUNTER — Encounter (HOSPITAL_BASED_OUTPATIENT_CLINIC_OR_DEPARTMENT_OTHER): Payer: Medicare (Managed Care) | Attending: Internal Medicine | Admitting: Internal Medicine

## 2021-06-03 ENCOUNTER — Other Ambulatory Visit: Payer: Self-pay

## 2021-06-03 ENCOUNTER — Other Ambulatory Visit: Payer: Self-pay | Admitting: Student

## 2021-06-03 DIAGNOSIS — L89313 Pressure ulcer of right buttock, stage 3: Secondary | ICD-10-CM | POA: Diagnosis not present

## 2021-06-03 DIAGNOSIS — L89153 Pressure ulcer of sacral region, stage 3: Secondary | ICD-10-CM | POA: Diagnosis present

## 2021-06-03 DIAGNOSIS — L89892 Pressure ulcer of other site, stage 2: Secondary | ICD-10-CM | POA: Insufficient documentation

## 2021-06-03 DIAGNOSIS — Z992 Dependence on renal dialysis: Secondary | ICD-10-CM | POA: Insufficient documentation

## 2021-06-03 DIAGNOSIS — N186 End stage renal disease: Secondary | ICD-10-CM | POA: Insufficient documentation

## 2021-06-03 NOTE — Progress Notes (Signed)
Sims, Leonard ([id number]) ?Visit Report for 06/03/2021 ?Chief Complaint Document Details ?Patient Name: Date of Service: ?[name], [name] 06/03/2021 9:30 A M ?Medical Record Number: [id number] ?Patient Account Number: [id number] ?Date of Birth/Sex: Treating RN: ?07/06/1950 (71 y.o. M) ?Primary Care Provider: Ferne Sims Other Clinician: ?Referring Provider: ?Treating Provider/Extender: [name] ?Sims, Leonard ?Weeks in Treatment: 6 ?Information Obtained from: Patient ?Chief Complaint ?Multiple pressure ulcers to the sacrum, hip, buttocks and knee ?Electronic Signature(s) ?Signed: 06/03/2021 12:24:40 PM By: Leonard Shan DO ?Entered By: [name] on 06/03/2021 11:31:29 ?-------------------------------------------------------------------------------- ?HPI Details ?Patient Name: Date of Service: ?[name], [name] 06/03/2021 9:30 A M ?Medical Record Number: [id number] ?Patient Account Number: [id number] ?Date of Birth/Sex: Treating RN: ?07-01-1950 (71 y.o. M) ?Primary Care Provider: Ferne Sims Other Clinician: ?Referring Provider: ?Treating Provider/Extender: [name] ?Sims, Leonard ?Weeks in Treatment: 6 ?History of Present Illness ?HPI Description: Admission 04/19/2021 ?[name] is a 71 year old male with a past medical history of end-stage renal disease on hemodialysis, Substance abuse and hep C that presents to the ?clinic for multiple pressure ulcers to his bottom, sacrum and right knee. His daughter is present and helps provide the history. [place] ?he fell and developed pressure injuries. He declined going to a skilled nursing facility and had not been properly managing the wound beds. He ended up being ?hospitalized on 12/27/2020 for sepsis secondary to wound infections. He did require intubation for respiratory support. Since then he has moved to live with his ?daughter who helps take care of the wounds. She has been using calcium alginate to the  wound beds. He currently denies signs of infection. ?2/13; patient presents for follow-up. He has been using Hydrofera Blue to the right hip wound and right knee wound and Dakin's to the sacral and gluteus ?wound. He denies signs of infection. He has a hard time offloading the wound beds however does try to do so. Please no issues or complaints today. ?3/16; patient presents for follow-up. He has been using Hydrofera Blue to the right hip wound and right knee. He reports that the right knee wound is healed. He ?continues Dakin's to the sacral and gluteus wound. He obtained his sacral x-ray. He has no issues or complaints today. He denies signs of infection. ?Electronic Signature(s) ?Signed: 06/03/2021 12:24:40 PM By: Leonard Shan DO ?Entered By: [name] on 06/03/2021 11:33:39 ?-------------------------------------------------------------------------------- ?Chemical Cauterization Details ?Patient Name: Date of Service: ?[name], [name] 06/03/2021 9:30 A M ?Medical Record Number: [id number] ?Patient Account Number: [id number] ?Date of Birth/Sex: Treating RN: ?12/26/1950 (71 y.o. [name] ?Primary Care Provider: Ferne Sims Other Clinician: ?Referring Provider: ?Treating Provider/Extender: [name] ?Sims, Leonard ?Weeks in Treatment: 6 ?Procedure Performed for: Wound #2 Right Trochanter ?Performed By: Physician Leonard Shan, DO ?Post Procedure Diagnosis ?Same as Pre-procedure ?Electronic Signature(s) ?Signed: 06/03/2021 12:24:40 PM By: Leonard Shan DO ?Signed: 06/03/2021 6:23:55 PM By: [name] ?Entered By: [name] on 06/03/2021 10:30:39 ?-------------------------------------------------------------------------------- ?Physical Exam Details ?Patient Name: Date of Service: ?[name], [name] 06/03/2021 9:30 A M ?Medical Record Number: [id number] ?Patient Account Number: [id number] ?Date of Birth/Sex: Treating RN: ?Jul 03, 1950 (71 y.o. M) ?Primary Care Provider: Ferne Sims Other Clinician: ?Referring Provider: ?Treating Provider/Extender: [name] ?Sims, Leonard ?Weeks in Treatment: 6 ?Constitutional ?respirations regular, non-labored and within target range for patient.Marland Kitchen ?Psychiatric ?pleasant and cooperative. ?Notes ?T the sacrum there is an open wound with granulation tissue present. T the right hip there is a  large open wound with granulation tissue throughout and ?o o ?epithelization occurring to the edges circumferentially. T the right ischium there is a small wound that tunnels but with granulation tissue throughout. Right ?o ?lateral knee has epithelization to the previous wound site. ?Electronic Signature(s) ?Signed: 06/03/2021 12:24:40 PM By: Leonard Shan DO ?Entered By: [name] on 06/03/2021 11:35:28 ?-------------------------------------------------------------------------------- ?Physician Orders Details ?Patient Name: Date of Service: ?[name], [name] 06/03/2021 9:30 A M ?Medical Record Number: [id number] ?Patient Account Number: [id number] ?Date of Birth/Sex: Treating RN: ?[birth year] (71 y.o. [name] ?Primary Care Provider: Ferne Sims Other Clinician: ?Referring Provider: ?Treating Provider/Extender: [name] ?Sims, Leonard ?Weeks in Treatment: 6 ?Verbal / Phone Orders: No ?Diagnosis Coding ?Follow-up Appointments ?ppointment in 2 weeks. - Dr Heber Schoeneck and Allayne Butcher Room # 9 ?Return A ?Bathing/ Shower/ Hygiene ?May shower with protection but do not get wound dressing(s) wet. ?Off-Loading ?Low air-loss mattress (Group 2) ?Turn and reposition every 2 hours ?Wound Treatment ?Wound #1 - Sacrum ?Cleanser: Wound Cleanser (DME) (Generic) 1 x Per Day/15 Days ?Discharge Instructions: Cleanse the wound with wound cleanser prior to applying a clean dressing using gauze sponges, not tissue or cotton balls. ?Cleanser: Byram Ancillary Kit - 15 Day Supply (DME) (Generic) 1 x Per Day/15 Days ?Discharge Instructions: Use supplies as  instructed; Kit contains: (15) Saline Bullets; (15) 3x3 Gauze; 15 pr Gloves ?Prim Dressing: Dakin's Solution 0.125%, 16 (oz) (DME) (Generic) 1 x Per Day/15 Days ?ary ?Discharge Instructions: Moisten gauze with Dakin's solution ?Secondary Dressing: Woven Gauze Sponges 2x2 in (DME) (Generic) 1 x Per Day/15 Days ?Discharge Instructions: Apply over primary dressing as directed. ?Secondary Dressing: Bordered Gauze, 4x4 in (DME) (Generic) 1 x Per Day/15 Days ?Discharge Instructions: Apply over primary dressing as directed. ?Secured With: 47M Medipore H Soft Cloth Surgical T ape, 4 x 10 (in/yd) (DME) (Generic) 1 x Per Day/15 Days ?Discharge Instructions: Secure with tape as directed. ?Wound #2 - Trochanter Wound Laterality: Right ?Cleanser: Wound Cleanser (DME) (Generic) 1 x Per Day/15 Days ?Discharge Instructions: Cleanse the wound with wound cleanser prior to applying a clean dressing using gauze sponges, not tissue or cotton balls. ?Cleanser: Byram Ancillary Kit - 15 Day Supply (DME) (Generic) 1 x Per Day/15 Days ?Discharge Instructions: Use supplies as instructed; Kit contains: (15) Saline Bullets; (15) 3x3 Gauze; 15 pr Gloves ?Prim Dressing: Hydrofera Blue Classic Foam, 4x4 in (DME) (Generic) 1 x Per Day/15 Days ?ary ?Discharge Instructions: Moisten with saline prior to applying to wound bed ?Secondary Dressing: Woven Gauze Sponges 2x2 in (DME) (Generic) 1 x Per Day/15 Days ?Discharge Instructions: Apply over primary dressing as directed. ?Secondary Dressing: Zetuvit Plus Silicone Border Dressing 7x7(in/in) (DME) (Generic) 1 x Per Day/15 Days ?Discharge Instructions: Apply silicone border over primary dressing as directed. ?Secured With: 47M Medipore H Soft Cloth Surgical T ape, 4 x 10 (in/yd) (DME) (Generic) 1 x Per Day/15 Days ?Discharge Instructions: Secure with tape as directed. ?Wound #3 - Gluteus Wound Laterality: Right ?Cleanser: Wound Cleanser (DME) (Generic) 1 x Per Day/15 Days ?Discharge Instructions: Cleanse  the wound with wound cleanser prior to applying a clean dressing using gauze sponges, not tissue or cotton balls. ?Cleanser: Byram Ancillary Kit - 15 Day Supply (DME) (Generic) 1 x Per Day/15 Days ?Discha

## 2021-06-04 ENCOUNTER — Other Ambulatory Visit (HOSPITAL_COMMUNITY): Payer: Medicare (Managed Care)

## 2021-06-04 ENCOUNTER — Encounter (HOSPITAL_COMMUNITY): Payer: Self-pay

## 2021-06-04 ENCOUNTER — Encounter: Payer: Medicare (Managed Care) | Admitting: Vascular Surgery

## 2021-06-04 ENCOUNTER — Other Ambulatory Visit: Payer: Self-pay

## 2021-06-04 ENCOUNTER — Encounter (HOSPITAL_COMMUNITY): Payer: Medicare (Managed Care)

## 2021-06-04 ENCOUNTER — Other Ambulatory Visit (HOSPITAL_COMMUNITY): Payer: Self-pay | Admitting: Nephrology

## 2021-06-04 ENCOUNTER — Ambulatory Visit (HOSPITAL_COMMUNITY)
Admission: RE | Admit: 2021-06-04 | Discharge: 2021-06-04 | Disposition: A | Discharge status: Home / Self Care | Payer: Medicare (Managed Care) | Source: Ambulatory Visit | Attending: Nephrology | Admitting: Nephrology

## 2021-06-04 DIAGNOSIS — Z79899 Other long term (current) drug therapy: Secondary | ICD-10-CM | POA: Diagnosis not present

## 2021-06-04 DIAGNOSIS — Z7901 Long term (current) use of anticoagulants: Secondary | ICD-10-CM | POA: Diagnosis not present

## 2021-06-04 DIAGNOSIS — Z87891 Personal history of nicotine dependence: Secondary | ICD-10-CM | POA: Insufficient documentation

## 2021-06-04 DIAGNOSIS — Y841 Kidney dialysis as the cause of abnormal reaction of the patient, or of later complication, without mention of misadventure at the time of the procedure: Secondary | ICD-10-CM | POA: Diagnosis not present

## 2021-06-04 DIAGNOSIS — I959 Hypotension, unspecified: Secondary | ICD-10-CM | POA: Diagnosis not present

## 2021-06-04 DIAGNOSIS — N186 End stage renal disease: Secondary | ICD-10-CM | POA: Diagnosis not present

## 2021-06-04 DIAGNOSIS — T8249XA Other complication of vascular dialysis catheter, initial encounter: Secondary | ICD-10-CM | POA: Diagnosis present

## 2021-06-04 DIAGNOSIS — I4891 Unspecified atrial fibrillation: Secondary | ICD-10-CM | POA: Diagnosis not present

## 2021-06-04 DIAGNOSIS — Z992 Dependence on renal dialysis: Secondary | ICD-10-CM | POA: Insufficient documentation

## 2021-06-04 HISTORY — PX: IR US GUIDE VASC ACCESS RIGHT: IMG2390

## 2021-06-04 HISTORY — PX: IR REMOVAL TUN CV CATH W/O FL: IMG2289

## 2021-06-04 HISTORY — PX: IR FLUORO GUIDE CV LINE RIGHT: IMG2283

## 2021-06-04 MED ORDER — GELATIN ABSORBABLE 12-7 MM EX MISC
CUTANEOUS | Status: AC
  Filled 2021-06-04: qty 1

## 2021-06-04 MED ORDER — FENTANYL CITRATE (PF) 100 MCG/2ML IJ SOLN
INTRAMUSCULAR | Status: AC
  Filled 2021-06-04: qty 2

## 2021-06-04 MED ORDER — LIDOCAINE-EPINEPHRINE 1 %-1:100000 IJ SOLN
INTRAMUSCULAR | Status: AC
  Administered 2021-06-04: 10 mL
  Filled 2021-06-04: qty 1

## 2021-06-04 MED ORDER — MIDAZOLAM HCL 2 MG/2ML IJ SOLN
INTRAMUSCULAR | Status: AC
  Filled 2021-06-04: qty 2

## 2021-06-04 MED ORDER — FENTANYL CITRATE (PF) 100 MCG/2ML IJ SOLN
INTRAMUSCULAR | Status: AC | PRN
  Administered 2021-06-04: 25 ug via INTRAVENOUS

## 2021-06-04 MED ORDER — HEPARIN SODIUM (PORCINE) 1000 UNIT/ML IJ SOLN
INTRAMUSCULAR | Status: AC
  Filled 2021-06-04: qty 10

## 2021-06-04 MED ORDER — CEFAZOLIN SODIUM-DEXTROSE 2-4 GM/100ML-% IV SOLN: 2.0000 g | INTRAVENOUS | Status: AC

## 2021-06-04 MED ORDER — CEFAZOLIN SODIUM-DEXTROSE 2-4 GM/100ML-% IV SOLN
INTRAVENOUS | Status: AC
  Administered 2021-06-04: 2 g via INTRAVENOUS
  Filled 2021-06-04: qty 100

## 2021-06-04 MED ORDER — SODIUM CHLORIDE 0.9 % IV SOLN: INTRAVENOUS | Status: DC

## 2021-06-04 NOTE — Progress Notes (Signed)
Crawford, Leonard ([id number]) ?Visit Report for 06/03/2021 ?Arrival Information Details ?Patient Name: Date of Service: ?[name], [name] 06/03/2021 9:30 A M ?Medical Record Number: [id number] ?Patient Account Number: [id number] ?Date of Birth/Sex: Treating RN: ?[birth year] (71 y.o. [name] ?Primary Care Dray Dente: [name] Other Clinician: ?Referring Ourania Hamler: ?Treating Quinn Bartling/Extender: [name] ?[name], [name] ?Weeks in Treatment: 6 ?Visit Information History Since Last Visit ?Added or deleted any medications: No ?Patient Arrived: Wheel Chair ?Any new allergies or adverse reactions: No ?Arrival Time: 09:41 ?Had a fall or experienced change in No ?Accompanied By: daughter ?activities of daily living that may affect ?Transfer Assistance: Manual ?risk of falls: ?Patient Identification Verified: Yes ?Signs or symptoms of abuse/neglect since last visito No ?Secondary Verification Process Completed: Yes ?Hospitalized since last visit: No ?Patient Requires Transmission-Based Precautions: No ?Implantable device outside of the clinic excluding No ?Patient Has Alerts: No ?cellular tissue based products placed in the center ?since last visit: ?Has Dressing in Place as Prescribed: Yes ?Pain Present Now: Yes ?Electronic Signature(s) ?Signed: 06/03/2021 6:23:55 PM By: [name] ?Entered By: [name] on 06/03/2021 09:43:41 ?-------------------------------------------------------------------------------- ?Encounter Discharge Information Details ?Patient Name: Date of Service: ?[name], [name] 06/03/2021 9:30 A M ?Medical Record Number: [id number] ?Patient Account Number: [id number] ?Date of Birth/Sex: Treating RN: ?[birth year] (70 y.o. [name] ?Primary Care Amiracle Neises: [name] Other Clinician: ?Referring Alfredo Spong: ?Treating Baelyn Doring/Extender: [name] ?[name], [name] ?Weeks in Treatment: 6 ?Encounter Discharge Information Items ?Discharge Condition: Stable ?Ambulatory  Status: Wheelchair ?Discharge Destination: Home ?Transportation: Private Auto ?Accompanied By: daughter ?Schedule Follow-up Appointment: Yes ?Clinical Summary of Care: Patient Declined ?Electronic Signature(s) ?Signed: 06/04/2021 12:57:05 PM By: Rhae Hammock RN ?Entered By: Rhae Hammock on 06/03/2021 10:42:26 ?-------------------------------------------------------------------------------- ?Lower Extremity Assessment Details ?Patient Name: ?Date of Service: ?[name], [name] 06/03/2021 9:30 A M ?Medical Record Number: [id number] ?Patient Account Number: [id number] ?Date of Birth/Sex: ?Treating RN: ?04/17/1950 (71 y.o. [name] ?Primary Care Khaiden Segreto: [name] ?Other Clinician: ?Referring Zandria Woldt: ?Treating Stanislaus Kaltenbach/Extender: [name] ?[name], [name] ?Weeks in Treatment: 6 ?Edema Assessment ?Assessed: [Left: No] [Right: Yes] ?Edema: [Left: N] [Right: o] ?Electronic Signature(s) ?Signed: 06/03/2021 6:23:55 PM By: [name] ?Entered By: [name] on 06/03/2021 09:51:45 ?-------------------------------------------------------------------------------- ?Multi Wound Chart Details ?Patient Name: ?Date of Service: ?[name], [name] 06/03/2021 9:30 A M ?Medical Record Number: [id number] ?Patient Account Number: [id number] ?Date of Birth/Sex: ?Treating RN: ?09-Feb-1951 (71 y.o. M) ?Primary Care Moneka Mcquinn: [name] ?Other Clinician: ?Referring Nasim Garofano: ?Treating Kassidi Elza/Extender: [name] ?[name], [name] ?Weeks in Treatment: 6 ?Vital Signs ?Height(in): 73 ?Pulse(bpm): 48 ?Weight(lbs): 147 ?Blood Pressure(mmHg): 135/81 ?Body Mass Index(BMI): 19.4 ?Temperature(??F): 98.5 ?Respiratory Rate(breaths/min): 17 ?Photos: ?Sacrum Right Trochanter Right Gluteus ?Wound Location: ?Pressure Injury Pressure Injury Pressure Injury ?Wounding Event: ?Pressure Ulcer Pressure Ulcer Pressure Ulcer ?Primary Etiology: ?Arrhythmia Arrhythmia Arrhythmia ?Comorbid History: ?01/26/2021 01/26/2021  01/26/2021 ?Date Acquired: ?6 6 6  ?Weeks of Treatment: ?Open Open Open ?Wound Status: ?No No No ?Wound Recurrence: ?2x0.5x1.5 9x2.5x0.1 0.2x0.3x0.3 ?Measurements L x W x D (cm) ?0.785 17.671 0.047 ?A (cm?) : ?rea ?1.178 1.767 0.014 ?Volume (cm?) : ?75.00% 67.90% 98.30% ?% Reduction in A rea: ?53.10% 84.00% 99.50% ?% Reduction in Volume: ?2 ?Position 1 (o'clock): ?2 ?Maximum Distance 1 (cm): ?12 ?Starting Position 1 (o'clock): ?7 ?Ending Position 1 (o'clock): ?2 ?Maximum Distance 1 (cm): ?No N/A Yes ?Tunneling: ?Yes N/A No ?Undermining: ?Category/Stage III Category/Stage III Category/Stage III ?Classification: ?Medium Medium Medium ?Exudate A mount: ?Serosanguineous Serosanguineous Serosanguineous ?Exudate Type: ?red, brown red, brown red, brown ?Exudate  Color: ?Well defined, not attached Flat and Intact Flat and Intact ?Wound Margin: ?Large (67-100%) Large (67-100%) Large (67-100%) ?Granulation A mount: ?Pink Red, Pink Pink ?Granulation Quality: ?Small (1-33%) Small (1-33%) Small (1-33%) ?Necrotic A mount: ?Fat Layer (Subcutaneous Tissue): Yes Fat Layer (Subcutaneous Tissue): Yes Fat Layer (Subcutaneous Tissue): Yes ?Exposed Structures: ?Fascia: No ?Fascia: No ?Fascia: No ?Tendon: No ?Tendon: No ?Tendon: No ?Muscle: No ?Muscle: No ?Muscle: No ?Joint: No ?Joint: No ?Joint: No ?Bone: No ?Bone: No ?Bone: No ?Small (1-33%) Small (1-33%) Large (67-100%) ?Epithelialization: ?N/A Chemical Cauterization N/A ?Procedures Performed: ?Wound Number: 4 N/A N/A ?Photos: No Photos N/A N/A ?Right, Lateral Knee N/A N/A ?Wound Location: ?Pressure Injury N/A N/A ?Wounding Event: ?Pressure Ulcer N/A N/A ?Primary Etiology: ?N/A N/A N/A ?Comorbid History: ?01/26/2021 N/A N/A ?Date Acquired: ?6 N/A N/A ?Weeks of Treatment: ?Open N/A N/A ?Wound Status: ?No N/A N/A ?Wound Recurrence: ?0x0x0 N/A N/A ?Measurements L x W x D (cm) ?0 N/A N/A ?A (cm?) : ?rea ?0 N/A N/A ?Volume (cm?) : ?100.00% N/A N/A ?% Reduction in A rea: ?100.00% N/A N/A ?%  Reduction in Volume: ?N/A N/A N/A ?Tunneling: ?N/A N/A N/A ?Undermining: ?Category/Stage II N/A N/A ?Classification: ?Medium N/A N/A ?Exudate A mount: ?Serosanguineous N/A N/A ?Exudate Type: ?red, brown N/A N/A ?Exudate Color: ?N/A N/A N/A ?Wound Margin: ?N/A N/A N/A ?Granulation A mount: ?N/A N/A N/A ?Granulation Quality: ?N/A N/A N/A ?Necrotic A mount: ?N/A N/A N/A ?Exposed Structures: ?N/A N/A N/A ?Epithelialization: ?N/A N/A N/A ?Procedures Performed: ?Treatment Notes ?Wound #1 (Sacrum) ?Cleanser ?Wound Cleanser ?Discharge Instruction: Cleanse the wound with wound cleanser prior to applying a clean dressing using gauze sponges, not tissue or cotton balls. ?Byram Ancillary Kit - 15 Day Supply ?Discharge Instruction: Use supplies as instructed; Kit contains: (15) Saline Bullets; (15) 3x3 Gauze; 15 pr Gloves ?Peri-Wound Care ?Topical ?Primary Dressing ?Dakin's Solution 0.125%, 16 (oz) ?Discharge Instruction: Moisten gauze with Dakin's solution ?Secondary Dressing ?Woven Gauze Sponges 2x2 in ?Discharge Instruction: Apply over primary dressing as directed. ?Bordered Gauze, 4x4 in ?Discharge Instruction: Apply over primary dressing as directed. ?Secured With ?61M Medipore H Soft Cloth Surgical T ape, 4 x 10 (in/yd) ?Discharge Instruction: Secure with tape as directed. ?Compression Wrap ?Compression Stockings ?Add-Ons ?Wound #2 (Trochanter) Wound Laterality: Right ?Cleanser ?Wound Cleanser ?Discharge Instruction: Cleanse the wound with wound cleanser prior to applying a clean dressing using gauze sponges, not tissue or cotton balls. ?Byram Ancillary Kit - 15 Day Supply ?Discharge Instruction: Use supplies as instructed; Kit contains: (15) Saline Bullets; (15) 3x3 Gauze; 15 pr Gloves ?Peri-Wound Care ?Topical ?Primary Dressing ?Hydrofera Blue Classic Foam, 4x4 in ?Discharge Instruction: Moisten with saline prior to applying to wound bed ?Secondary Dressing ?Woven Gauze Sponges 2x2 in ?Discharge Instruction: Apply over  primary dressing as directed. ?Zetuvit Plus Silicone Border Dressing 7x7(in/in) ?Discharge Instruction: Apply silicone border over primary dressing as directed. ?Secured With ?61M Medipore H Public affairs consultant Surgical T ap

## 2021-06-04 NOTE — Procedures (Signed)
Interventional Radiology Procedure Note ? ?Procedure: Placement of a right IJ approach tunneled HD cath. 19cm tip to cuff.  ? ?  Tip is positioned at the superior cavoatrial junction and catheter is ready for immediate use.  ?Complications: None ?Recommendations:  ?- Ok to use ?- Do not submerge ?- Routine line care  ? ?Signed, ? ?Dulcy Fanny. Earleen [place], DO ? ? ?

## 2021-06-04 NOTE — H&P (Signed)
Chief Complaint: Patient was seen in consultation today for tunneled hemodialysis catheter exchange with possible replacement at the request of [name]  Referring Physician(s): [name]  Supervising Physician: [name]  Patient Status: [facility] - Out-pt  History of Present Illness: [name] is a 71 y.o. male w/ PMH of A-fib, ESRD on HD, and hypotension. Pt was referred to IR to have replacement HD catheter after his current line became faulty. Per order,blood flow is too low and attempted declot x2 with Activase did not resolve issue. Pt was referred by [name] for tunneled HD catheter replacement.   Past Medical History:  Diagnosis Date   A-fib (HCC)    Edema    ESRD (end stage renal disease) (HCC)    History of insertion of tunneled central venous catheter (CVC) with port    Hypotension     No past surgical history on file.  Allergies: Povidone iodine  Medications: Prior to Admission medications   Medication Sig Start Date End Date Taking? Authorizing Provider  apixaban (ELIQUIS) 5 MG TABS tablet Take 1 tablet (5 mg total) by mouth 2 (two) times daily. 06/01/21  Yes Leonard Pyo, MD  AURYXIA 1 GM 210 MG(Fe) tablet Take 420 mg by mouth 3 (three) times daily with meals. 04/26/21  Yes [provider]  carvedilol (COREG) 12.5 MG tablet Take 1 tablet (12.5 mg total) by mouth 2 (two) times daily. 06/01/21  Yes Leonard Pyo, MD  HYDROcodone-acetaminophen (NORCO) 10-325 MG tablet Take 1 tablet by mouth See admin instructions. Take 1 tablet daily, may take a second tablet as needed for pain   Yes [provider]  vitamin Sims (ASCORBIC ACID) 500 MG tablet Take 500 mg by mouth daily.   Yes [provider]  zinc sulfate 220 (50 Zn) MG capsule Take 220 mg by mouth daily.   Yes [provider]     Family History  Problem Relation Age of Onset   Hypertension Mother    Diabetes Mother    Heart attack Father     Social  History   Socioeconomic History   Marital status: Single    Spouse name: Not on file   Number of children: Not on file   Years of education: Not on file   Highest education level: Not on file  Occupational History   Not on file  Tobacco Use   Smoking status: Former    Types: Cigarettes   Smokeless tobacco: Not on file  Substance and Sexual Activity   Alcohol use: Not on file   Drug use: Not on file   Sexual activity: Not on file  Other Topics Concern   Not on file  Social History Narrative   Not on file   Social Determinants of Health   Financial Resource Strain: Not on file  Food Insecurity: Not on file  Transportation Needs: Not on file  Physical Activity: Not on file  Stress: Not on file  Social Connections: Not on file    Review of Systems: A 12 point ROS discussed and pertinent positives are indicated in the HPI above.  All other systems are negative.  Review of Systems  Constitutional:  Negative for chills and fever.  Respiratory:  Negative for cough and shortness of breath.   Cardiovascular:  Positive for chest pain. Negative for leg swelling.  Gastrointestinal:  Negative for abdominal pain, nausea and vomiting.  Neurological:  Negative for headaches.   Vital Signs: BP 122/78  Pulse 62   Temp 97.8 F (36.6 Sims) (Oral)   Resp 19   Ht 6\' 1"  (1.854 m)   Wt 160 lb (72.6 kg)   SpO2 95%   BMI 21.11 kg/m   Physical Exam Constitutional:      Appearance: Normal appearance. He is not ill-appearing.  HENT:     Head: Normocephalic and atraumatic.     Mouth/Throat:     Mouth: Mucous membranes are dry.     Pharynx: Oropharynx is clear.  Eyes:     Extraocular Movements: Extraocular movements intact.     Pupils: Pupils are equal, round, and reactive to light.  Cardiovascular:     Rate and Rhythm: Normal rate and regular rhythm.     Pulses: Normal pulses.     Heart sounds: Normal heart sounds.  Pulmonary:     Effort: Pulmonary effort is normal. No  respiratory distress.     Breath sounds: Normal breath sounds.  Abdominal:     General: Bowel sounds are normal. There is no distension.     Tenderness: There is no abdominal tenderness. There is no guarding.  Musculoskeletal:     Right lower leg: No edema.     Left lower leg: No edema.     Comments: LUE fistula with no bruit or thrill noted.   Skin:    General: Skin is warm and dry.     Comments: R side upper chest scar from previous HD catheter placement. Sutures still in place at base of R neck.   Neurological:     Mental Status: He is alert and oriented to person, place, and time.  Psychiatric:        Mood and Affect: Mood normal.        Behavior: Behavior normal.        Thought Content: Thought content normal.        Judgment: Judgment normal.    Imaging: DG Pelvis 1-2 Views  Result Date: 05/25/2021 CLINICAL DATA:  Sacral ulcer. Two views to assess stage III pressure ulcer of sacrum. [facility] EXAM: PELVIS - 1-2 VIEW COMPARISON:  None. FINDINGS: There is diffuse decreased bone mineralization. Within this limitation, there appears to be irregularity of the posterior soft tissues along the course of the distal sacrum and coccyx. The decreased bone mineralization limits evaluation for cortical erosion. 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 Dense vascular calcifications are noted. IMPRESSION: Diffuse decreased bone mineralization. This limits evaluation for cortical erosion in the sacrum and coccyx, however no definitive cortical erosion is identified. Electronically Signed   By: Neita Garnet M.D.   On: 05/25/2021 18:12   VAS [place] UPPER EXTREMITY ARTERIAL DUPLEX  Result Date: 05/31/2021  UPPER EXTREMITY DUPLEX STUDY Patient Name:  [name]  Date of Exam:   05/31/2021 Medical Rec #: [id number]    Accession #:    [id number] Date  of Birth: Aug 02, 1950    Patient Gender: M Patient Age:   36 years Exam Location:  [facility] Procedure:      VAS [place] UPPER EXTREMITY ARTERIAL DUPLEX Referring Phys: Ivin Booty ROBINS --------------------------------------------------------------------------------  Indications: Pre-access.  Other Factors: History of left AV graft, years ago per patient. Comparison Study: None Performing Technologist: Ethelle Lyon  Examination Guidelines: A complete evaluation includes B-mode imaging, spectral Doppler, color Doppler, and power Doppler as needed of all accessible portions of each vessel. Bilateral testing  is considered an integral part of a complete examination. Limited examinations for reoccurring indications may be performed as noted.  Right Pre-Dialysis Findings: +------------------+----------+-------------------+---------+------------------+ Location          PSV (cm/s)Intralum. Diam.    Waveform Comments                                       (cm)                                           +------------------+----------+-------------------+---------+------------------+ Brachial Antecub. 66        0.65               triphasichigh bifurcation - fossa                                                   proximal upper arm +------------------+----------+-------------------+---------+------------------+ Radial Art at     44        0.25               triphasic                   Wrist                                                                      +------------------+----------+-------------------+---------+------------------+ Ulnar Art at [id number]        0.22               triphasic                   +------------------+----------+-------------------+---------+------------------+  Left Pre-Dialysis Findings: +-----------------------+----------+--------------------+---------+--------+ Location               PSV (cm/s)Intralum. Diam. (cm)Waveform Comments  +-----------------------+----------+--------------------+---------+--------+ Brachial Antecub. fossa45        0.62                triphasic         +-----------------------+----------+--------------------+---------+--------+ Radial Art at Wrist    55        0.24                triphasic         +-----------------------+----------+--------------------+---------+--------+ Ulnar Art at Wrist     47        0.18                triphasic         +-----------------------+----------+--------------------+---------+--------+  Electronically signed by Coral Else MD on 05/31/2021 at 6:57:45 PM.    Final    VAS [place] UPPER EXTREMITY VEIN MAPPING  Result Date: 05/31/2021 UPPER EXTREMITY VEIN MAPPING Patient Name:  REASON HELZER  Date of Exam:   05/31/2021 Medical Rec #: [id number]    Accession #:    [id number] Date of Birth: [birth year]    Patient Gender: M Patient Age:   35 years Exam Location:  [facility]  Procedure:      VAS [place] UPPER EXT VEIN MAPPING (PRE-OP AVF) Referring Phys: JOSHUA ROBINS --------------------------------------------------------------------------------  Indications: Pre-access. History: History of left AV graft, years ago per patient.  Comparison Study: None Performing Technologist: Ethelle Lyon  Examination Guidelines: A complete evaluation includes B-mode imaging, spectral Doppler, color Doppler, and power Doppler as needed of all accessible portions of each vessel. Bilateral testing is considered an integral part of a complete examination. Limited examinations for reoccurring indications may be performed as noted. +-----------------+-------------+----------+----------------+ Right Cephalic   Diameter (cm)Depth (cm)    Findings     +-----------------+-------------+----------+----------------+ Shoulder                                chronic thrombus +-----------------+-------------+----------+----------------+ Prox upper arm                          chronic  thrombus +-----------------+-------------+----------+----------------+ Mid upper arm                           chronic thrombus +-----------------+-------------+----------+----------------+ Dist upper arm                          chronic thrombus +-----------------+-------------+----------+----------------+ Antecubital fossa                       chronic thrombus +-----------------+-------------+----------+----------------+ Prox forearm         0.17        0.26      branching     +-----------------+-------------+----------+----------------+ Mid forearm          0.22        0.25      branching     +-----------------+-------------+----------+----------------+ Dist forearm                            chronic thrombus +-----------------+-------------+----------+----------------+ Wrist                                   chronic thrombus +-----------------+-------------+----------+----------------+ +-----------------+-------------+----------+--------+ Right Basilic    Diameter (cm)Depth (cm)Findings +-----------------+-------------+----------+--------+ Mid upper arm        0.38        0.52            +-----------------+-------------+----------+--------+ Dist upper arm       0.29        0.34            +-----------------+-------------+----------+--------+ Antecubital fossa    0.52        0.27            +-----------------+-------------+----------+--------+ Prox forearm         0.40        0.26            +-----------------+-------------+----------+--------+ Mid forearm          0.31        0.24            +-----------------+-------------+----------+--------+ Distal forearm       0.25        0.18            +-----------------+-------------+----------+--------+ Wrist                0.24  0.25            +-----------------+-------------+----------+--------+ +-----------------+-------------+----------+---------+ Left Cephalic    Diameter  (cm)Depth (cm)Findings  +-----------------+-------------+----------+---------+ Shoulder             0.23        0.48             +-----------------+-------------+----------+---------+ Prox upper arm       0.27        0.32   branching +-----------------+-------------+----------+---------+ Mid upper arm        0.13        0.18   branching +-----------------+-------------+----------+---------+ Dist upper arm       0.20        0.23             +-----------------+-------------+----------+---------+ Antecubital fossa    0.21        0.34             +-----------------+-------------+----------+---------+ Prox forearm         0.27        0.27   branching +-----------------+-------------+----------+---------+ Mid forearm          0.23        0.17             +-----------------+-------------+----------+---------+ Dist forearm         0.28        0.17             +-----------------+-------------+----------+---------+ Wrist                0.27        0.22             +-----------------+-------------+----------+---------+ +-----------------+-------------+----------+--------+ Left Basilic     Diameter (cm)Depth (cm)Findings +-----------------+-------------+----------+--------+ Dist upper arm       0.37        0.30            +-----------------+-------------+----------+--------+ Antecubital fossa    0.44        0.24            +-----------------+-------------+----------+--------+ Prox forearm         0.43        0.20            +-----------------+-------------+----------+--------+ Mid forearm          0.32        0.03            +-----------------+-------------+----------+--------+ Distal forearm       0.20        0.16            +-----------------+-------------+----------+--------+ Wrist                0.15        0.15            +-----------------+-------------+----------+--------+ Summary: Right: 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  *See table(s) above for measurements and observations.  Diagnosing physician: Coral Else MD Electronically signed by Coral Else MD on 05/31/2021 at 6:57:31 PM.    Final     Labs:  CBC: No results for input(s): WBC, HGB, HCT, PLT in the last 8760 hours.  COAGS: No results for input(s): INR, APTT in the last 8760 hours.  BMP: No results for input(s): NA, K, CL, CO2, GLUCOSE, BUN, CALCIUM, CREATININE, GFRNONAA, GFRAA in the last 8760 hours.  Invalid input(s): CMP  LIVER FUNCTION TESTS: No results for input(s): BILITOT, AST, ALT, ALKPHOS, PROT, ALBUMIN in the last  8760 hours.  TUMOR MARKERS: No results for input(s): AFPTM, CEA, CA199, CHROMGRNA in the last 8760 hours.  Assessment and Plan: History of A-fib, ESRD on HD, and hypotension. Pt was referred to IR to have replacement HD catheter after his current line became faulty. Per order, blood flow is too low and attempted declot x2 with Activase did not resolve issue. Pt was referred by [name] for HD catheter replacement.   Pt resting on stretcher with daughter at bedside. He is A&O, calm and pleasant. He is in no distress.  Pt and daughter are concerned about exchanging of catheter d/t pain and limited movement of L arm. Pt and daughter would like catheter to be moved if possible. Pt states that he has not used AV fistula in a long time. He reports surgical intervention on L AV fistula failed and that is why he has L sided HD catheter. Pt has scar from previous R side HD catheter.  Pt currently taking Eliquis.  He is NPO per order.  Risks and benefits discussed with the patient including, but not limited to bleeding, infection, vascular injury, pneumothorax which may require chest tube placement, air embolism or even death  All of the patient's questions were answered, patient is agreeable to proceed. Consent signed and in chart.   Thank you for this interesting consult.  I greatly enjoyed meeting [name] and  look forward to participating in their care.  A copy of this report was sent to the requesting provider on this date.  Electronically Signed: Shon Hough, NP 06/04/2021, 11:06 AM   I spent a total of 20 minutes in face to face in clinical consultation, greater than 50% of which was counseling/coordinating care for tunneled hemodialysis catheter exchange with possible replacement.

## 2021-06-04 NOTE — Procedures (Signed)
Interventional Radiology Procedure Note ? ?Procedure:  ? ?Removal of left IJ tunneled HD.  ? ?Removal of retained sutures of the LUE fistula, remote access, as well as the left AND right IJ puncture sites.  ? ?Complications: None ?Recommendations:  ? ?- Do not submerge for 7 days ?- Routine wound care  ? ?Signed, ? ?Dulcy Fanny. Earleen Newport, DO ? ? ?

## 2021-06-04 NOTE — Sedation Documentation (Signed)
Patient is having periods where he has long pauses and his heart rate will drop down to 39 beats per minute.  Dr. Earleen Newport made aware of the situation and reviewed previous EKG and the rhythm on the heart monitor.   ?

## 2021-06-09 ENCOUNTER — Encounter (HOSPITAL_COMMUNITY): Payer: Self-pay | Admitting: Emergency Medicine

## 2021-06-09 ENCOUNTER — Observation Stay (HOSPITAL_COMMUNITY)
Admission: EM | Admit: 2021-06-09 | Discharge: 2021-06-10 | Discharge status: Against Medical Advice | Payer: Medicare (Managed Care) | Attending: Internal Medicine | Admitting: Internal Medicine

## 2021-06-09 ENCOUNTER — Telehealth: Payer: Self-pay

## 2021-06-09 DIAGNOSIS — T829XXA Unspecified complication of cardiac and vascular prosthetic device, implant and graft, initial encounter: Secondary | ICD-10-CM | POA: Diagnosis not present

## 2021-06-09 DIAGNOSIS — Z87891 Personal history of nicotine dependence: Secondary | ICD-10-CM | POA: Insufficient documentation

## 2021-06-09 DIAGNOSIS — E875 Hyperkalemia: Secondary | ICD-10-CM | POA: Insufficient documentation

## 2021-06-09 DIAGNOSIS — Z992 Dependence on renal dialysis: Secondary | ICD-10-CM

## 2021-06-09 DIAGNOSIS — Z7901 Long term (current) use of anticoagulants: Secondary | ICD-10-CM | POA: Diagnosis not present

## 2021-06-09 DIAGNOSIS — N186 End stage renal disease: Secondary | ICD-10-CM | POA: Insufficient documentation

## 2021-06-09 DIAGNOSIS — R001 Bradycardia, unspecified: Secondary | ICD-10-CM | POA: Diagnosis not present

## 2021-06-09 DIAGNOSIS — I480 Paroxysmal atrial fibrillation: Secondary | ICD-10-CM | POA: Insufficient documentation

## 2021-06-09 DIAGNOSIS — T82838A Hemorrhage of vascular prosthetic devices, implants and grafts, initial encounter: Secondary | ICD-10-CM | POA: Insufficient documentation

## 2021-06-09 DIAGNOSIS — Z79899 Other long term (current) drug therapy: Secondary | ICD-10-CM | POA: Diagnosis not present

## 2021-06-09 DIAGNOSIS — L8990 Pressure ulcer of unspecified site, unspecified stage: Secondary | ICD-10-CM | POA: Diagnosis present

## 2021-06-09 LAB — COMPREHENSIVE METABOLIC PANEL
ALT: 9 U/L (ref 0–44)
AST: 20 U/L (ref 15–41)
Albumin: 3 g/dL — ABNORMAL LOW (ref 3.5–5.0)
Alkaline Phosphatase: 160 U/L — ABNORMAL HIGH (ref 38–126)
Anion gap: 14 (ref 5–15)
BUN: 46 mg/dL — ABNORMAL HIGH (ref 8–23)
CO2: 26 mmol/L (ref 22–32)
Calcium: 9.6 mg/dL (ref 8.9–10.3)
Chloride: 100 mmol/L (ref 98–111)
Creatinine, Ser: 6.59 mg/dL — ABNORMAL HIGH (ref 0.61–1.24)
GFR, Estimated: 8 mL/min — ABNORMAL LOW (ref >60)
Glucose, Bld: 81 mg/dL (ref 70–99)
Potassium: 5.5 mmol/L — ABNORMAL HIGH (ref 3.5–5.1)
Sodium: 140 mmol/L (ref 135–145)
Total Bilirubin: 0.5 mg/dL (ref 0.3–1.2)
Total Protein: 7.2 g/dL (ref 6.5–8.1)

## 2021-06-09 LAB — CBC WITH DIFFERENTIAL/PLATELET
Abs Immature Granulocytes: 0.02 10*3/uL (ref 0.00–0.07)
Basophils Absolute: 0 10*3/uL (ref 0.0–0.1)
Basophils Relative: 1 %
Eosinophils Absolute: 0.2 10*3/uL (ref 0.0–0.5)
Eosinophils Relative: 3 %
HCT: 44 % (ref 39.0–52.0)
Hemoglobin: 13.2 g/dL (ref 13.0–17.0)
Immature Granulocytes: 0 %
Lymphocytes Relative: 33 %
Lymphs Abs: 1.8 10*3/uL (ref 0.7–4.0)
MCH: 25.6 pg — ABNORMAL LOW (ref 26.0–34.0)
MCHC: 30 g/dL (ref 30.0–36.0)
MCV: 85.4 fL (ref 80.0–100.0)
Monocytes Absolute: 0.4 10*3/uL (ref 0.1–1.0)
Monocytes Relative: 8 %
Neutro Abs: 3 10*3/uL (ref 1.7–7.7)
Neutrophils Relative %: 55 %
Platelets: 215 10*3/uL (ref 150–400)
RBC: 5.15 MIL/uL (ref 4.22–5.81)
RDW: 15.9 % — ABNORMAL HIGH (ref 11.5–15.5)
WBC: 5.4 10*3/uL (ref 4.0–10.5)
nRBC: 0 % (ref 0.0–0.2)

## 2021-06-09 LAB — PROTIME-INR
INR: 1.7 — ABNORMAL HIGH (ref 0.8–1.2)
Prothrombin Time: 19.6 seconds — ABNORMAL HIGH (ref 11.4–15.2)

## 2021-06-09 MED ORDER — ONDANSETRON HCL 4 MG PO TABS: 4.0000 mg | ORAL_TABLET | Freq: Four times a day (QID) | ORAL | Status: DC | PRN

## 2021-06-09 MED ORDER — SODIUM ZIRCONIUM CYCLOSILICATE 10 G PO PACK
10.0000 g | PACK | Freq: Once | ORAL | Status: AC
  Administered 2021-06-09: 10 g via ORAL
  Filled 2021-06-09: qty 1

## 2021-06-09 MED ORDER — ACETAMINOPHEN 650 MG RE SUPP: 650.0000 mg | Freq: Four times a day (QID) | RECTAL | Status: DC | PRN

## 2021-06-09 MED ORDER — ONDANSETRON HCL 4 MG/2ML IJ SOLN: 4.0000 mg | Freq: Four times a day (QID) | INTRAMUSCULAR | Status: DC | PRN

## 2021-06-09 MED ORDER — SODIUM ZIRCONIUM CYCLOSILICATE 10 G PO PACK
10.0000 g | PACK | Freq: Three times a day (TID) | ORAL | Status: DC
  Administered 2021-06-09: 10 g via ORAL
  Filled 2021-06-09 (×2): qty 1

## 2021-06-09 MED ORDER — SODIUM CHLORIDE 0.9 % IV BOLUS
500.0000 mL | Freq: Once | INTRAVENOUS | Status: AC
  Administered 2021-06-09: 500 mL via INTRAVENOUS

## 2021-06-09 MED ORDER — "THROMBI-PAD 3""X3"" EX PADS"
1.0000 | MEDICATED_PAD | Freq: Once | CUTANEOUS | Status: AC
  Administered 2021-06-09: 1 via TOPICAL
  Filled 2021-06-09: qty 1

## 2021-06-09 MED ORDER — SENNOSIDES-DOCUSATE SODIUM 8.6-50 MG PO TABS: 1.0000 | ORAL_TABLET | Freq: Every evening | ORAL | Status: DC | PRN

## 2021-06-09 MED ORDER — SODIUM CHLORIDE 0.9% FLUSH
3.0000 mL | Freq: Two times a day (BID) | INTRAVENOUS | Status: DC
  Administered 2021-06-09: 3 mL via INTRAVENOUS

## 2021-06-09 MED ORDER — ACETAMINOPHEN 325 MG PO TABS: 650.0000 mg | ORAL_TABLET | Freq: Four times a day (QID) | ORAL | Status: DC | PRN

## 2021-06-09 NOTE — Hospital Course (Addendum)
[name] is a 71 y.o. male with medical history significant for ESRD on MWF HD, paroxysmal atrial fibrillation recently started on Eliquis, pressure ulcers to right hip/sacrum/buttocks who was admitted hospital for assessment of bradycardia. ?

## 2021-06-09 NOTE — Assessment & Plan Note (Addendum)
Initially presented with bleeding from HD catheter site.  Bleeding controlled with thrombi-pad.  Recently started on Eliquis 5 mg twice daily.  No active bleeding prior to his discharge.  Patient however has decided to leave [place] ? ?

## 2021-06-09 NOTE — Consult Note (Addendum)
?Cardiology Consultation:  ? ?Patient ID: [name] ?MRN: [id number]; DOB: [birth year] ? ?Admit date: 06/09/2021 ?Date of Consult: 06/09/2021 ? ?PCP:  Traci Sermon, PA-C ?  ?[facility] ?Cardiologist:  Dr. Lenna Sciara  ?Click here to update MD or APP on Care Team, Refresh:1}   ? ? ?Patient Profile:  ? ?[name] is a 71 y.o. male with a hx of paroxysmal atrial fibrillation, ESRD on HD MWF, HTN, HF with recovered EF on most recent Echo who is being seen 06/09/2021 for the evaluation of bradycardia at the request of Dr. Aletta Edouard. ? ?History of Present Illness:  ? ?[name] presented to the ED this AM with bleeding from his HD catheter. Patient last received HD two days prior to admission. He did not go to HD this AM. Patient woke up this AM and noticed that his chest was bleeding. He denied chest pain, dyspnea, palpitations, presyncope, or syncope, N/V or changes in bowel or bladder function.  ? ? ?Patient was found to have HR in the 40s and EKG showed HR of 39 with junctional rhythm.  Patient had mildly elevated potassium and was given a 1x dose of lokelma. He remained asymptomatic through this event.  ? ?Cardiology was consulted given patient's bradycardia.  ? ?Recently established care with Dr.Thukkani. It appears patient first went into Afib w/ RVR during a hospitalization 09/2020. At that time he was started on eliquis, diltiazem 180mg  daily, and toprolol 50mg  daily. This was all thought to be due to metabolic derangements in the setting of his ESRD.  ? ?He was again admitted to the hospital 12/2020 in [place] he was found to be septic in the setting of polymicrobial bacteremia and R hip osteomyelitis. On discharge from this hospitalization his diltiazem and metoprolol were discontinued and patient was discharged on amiodarone 200mg  daily and carvedilol 6.25mg  BID. His anticoagulation was also discontinued at this time as cardiology did not recommend it. There is no note in the chart  to clarify why this was discontinued.  ? ?He eventually established care with Dr. Ali Lowe and was on carvdilol 6.25mg  BID and amiodarone 200mg  daily. Dr. Ali Lowe instructed patient to discontinue his amiodarone and increase his carvedilol to 12.5 mg BID and to resume his blood thinner. Patient states that he continued to take carvediolol 6.25mg  BID though he occasionally added an additional dose & may also have missed a few doses. ? ?He also states that while receiving dialysis he has had episodes where his HR will become bradycardic and eventually goes to NSR. He said that he is asymptomatic during these episodes as well.  ? ?He denies SOB, chest pain, syncope or presyncope, palpitations.  ? ?Past Medical History:  ?Diagnosis Date  ? A-fib ([place])   ? Edema   ? ESRD (end stage renal disease) ([place])   ? History of insertion of tunneled central venous catheter (CVC) with port   ? Hypotension   ? ? ?Past Surgical History:  ?Procedure Laterality Date  ? IR FLUORO GUIDE CV LINE RIGHT  06/04/2021  ? IR REMOVAL TUN CV CATH W/O FL  06/04/2021  ? IR US GUIDE VASC ACCESS RIGHT  06/04/2021  ?  ? ?Home Medications:  ?Prior to Admission medications   ?Medication Sig Start Date End Date Taking? Authorizing Provider  ?apixaban (ELIQUIS) 5 MG TABS tablet Take 1 tablet (5 mg total) by mouth 2 (two) times daily. 06/01/21  Yes [name], MD  ?AURYXIA 1 GM 210 MG(Fe) tablet Take 420  mg by mouth 3 (three) times daily with meals. 04/26/21  Yes [provider]  ?carvedilol (COREG) 12.5 MG tablet Take 1 tablet (12.5 mg total) by mouth 2 (two) times daily. 06/01/21  Yes [name], MD  ?HYDROcodone-acetaminophen (NORCO) 10-325 MG tablet Take 1 tablet by mouth 2 (two) times daily as needed for moderate pain.   Yes [provider]  ?vitamin C (ASCORBIC ACID) 500 MG tablet Take 500 mg by mouth daily.   Yes [provider]  ?zinc sulfate 220 (50 Zn) MG capsule Take 220 mg by mouth daily.   Yes [provider]  ? ? ?Inpatient Medications: ?Scheduled Meds: ? sodium chloride flush  3 mL Intravenous Q12H  ? sodium zirconium cyclosilicate  10 g Oral TID  ? ?Continuous Infusions: ? ?PRN Meds: ?acetaminophen **OR** acetaminophen, ondansetron **OR** ondansetron (ZOFRAN) IV, senna-docusate ? ?Allergies:    ?Allergies  ?Allergen Reactions  ? Povidone Iodine Rash  ?  Betadine   ? ? ?Social History:   ?Social History  ? ?Socioeconomic History  ? Marital status: Single  ?  Spouse name: Not on file  ? Number of children: Not on file  ? Years of education: Not on file  ? Highest education level: Not on file  ?Occupational History  ? Not on file  ?Tobacco Use  ? Smoking status: Former  ?  Types: Cigarettes  ? Smokeless tobacco: Not on file  ?Substance and Sexual Activity  ? Alcohol use: Not on file  ? Drug use: Not on file  ? Sexual activity: Not on file  ?Other Topics Concern  ? Not on file  ?Social History Narrative  ? Not on file  ? ?Social Determinants of Health  ? ?Financial Resource Strain: Not on file  ?Food Insecurity: Not on file  ?Transportation Needs: Not on file  ?Physical Activity: Not on file  ?Stress: Not on file  ?Social Connections: Not on file  ?Intimate Partner Violence: Not on file  ?  ?Family History:   ?Family History  ?Problem Relation Age of Onset  ? Hypertension Mother   ? Diabetes Mother   ? Heart attack Father   ?  ? ?ROS:  ?Please see the history of present illness.  ?All other ROS reviewed and negative.    ? ?Physical Exam/Data:  ? ?Vitals:  ? 06/09/21 1845 06/09/21 2000 06/09/21 2004 06/09/21 2030  ?BP: 126/82 114/80  110/76  ?Pulse: (!) 59   (!) 55  ?Resp: 15 20  15   ?Temp:   98.6 ?F (37 ?C)   ?TempSrc:   Oral   ?SpO2: 98%   92%  ?Weight: 72.5 kg     ?Height: 6\' 1"  (1.854 m)     ? ? ?Intake/Output Summary (Last 24 hours) at 06/09/2021 2040 ?Last data filed at 06/09/2021 1901 ?Gross per 24 hour  ?Intake 500 ml  ?Output --  ?Net 500 ml  ? ? ?  06/09/2021  ?  6:45 PM 06/04/2021  ?  9:48 AM  06/01/2021  ?  8:58 AM  ?Last 3 Weights  ?Weight (lbs) 159 lb 13.3 oz 160 lb 160 lb  ?Weight (kg) 72.5 kg 72.576 kg 72.576 kg  ?   ?Body mass index is 21.09 kg/m?.  ?General:  Well nourished, well developed, in no acute distress ?HEENT: normal ?Neck: no JVD ?Vascular: No carotid bruits; Distal pulses 2+ bilaterally ?Cardiac:  normal S1, S2; RRR; 1/6 SEM at RUSB  ?Lungs:  clear to auscultation bilaterally, no wheezing,  rhonchi or rales  ?Chest: R chest w/ tunneled catheter dried blood.  ?Abd: soft, nontender, no hepatomegaly  ?Ext: no edema ?Musculoskeletal:  No deformities, BUE and BLE strength normal and equal; R Upper chest HD port with evidence of significant bleeding.  ?Skin: warm and dry  ?Neuro:  CNs 2-12 intact, no focal abnormalities noted ?Psych:  Normal affect  ? ?EKG:  The EKG was personally reviewed and demonstrates:  - Persistent LBBB with Juncitonal Rhythm & Escape beats; intermittent P waves noted - suspect underlying [place] with V-A Dissociation. ? ?Telemetry:  Telemetry was personally reviewed and demonstrates:  NSR (upon actual pt encounter) -- Tele reviewed prior to encounter (earlier in ED) - Bradycardic ~30s-40s with Junctional Escape Rhythm & High Grade AVB. ? ?Relevant CV Studies: ?TTE 12/29/2020 ? ?Summary:         Normal left ventricular chamber size with mild concentric left ventricular hypertrophy.  ?                         Low normal left ventricular systolic function. EF 50-55%.  ?                         Restrictive diastolic filling.  ?                         Moderate right ventricular enlargement with mild dysfunction.  ?                         Severe biatrial enlargement.  ?                         Mild to moderate mitral regurgitation  ?                         Mild to moderate tricuspid regurgitation.  ?                         Elevated estimated right ventricular systolic pressure at 45 mmHg suggesting pulmonary hypertension.  ?                         Dilated inferior vena  cava with reduced collapsibility suggesting increased right atrial pressures.  ? ?                         Compared to previous study dated 10/16/2020 there is improvement in left ventricular systolic function.  ?

## 2021-06-09 NOTE — ED Notes (Signed)
Pt is eating dinner. HD site looks great, clean and dry.  ?

## 2021-06-09 NOTE — ED Triage Notes (Signed)
Pt. Stated and daughter he has a new port from left to right. It was dling fine and he had dialysis on Monday and did fine. This morning when he woke up there was blood everywhere at his site. ?

## 2021-06-09 NOTE — H&P (Signed)
?History and Physical  ? ? ?[name] BCW:[id number] DOB: [birth year] DOA: 06/09/2021 ? ?PCP: Traci Sermon, PA-C  ?Patient coming from: Home ? ?I have personally briefly reviewed patient's old medical records in [place] ? ?Chief Complaint: Bleeding from HD catheter site ? ?HPI: ?[name] is a 71 y.o. male with medical history significant for ESRD on MWF HD, paroxysmal atrial fibrillation recently started on Eliquis, pressure ulcers to right hip/sacrum/buttocks who initially presented to the ED for evaluation of bleeding from his HD catheter site. ? ?Patient was seen by cardiology in clinic on 06/01/2021 to establish care after he recently moved here from [place].  His amiodarone was discontinued, Coreg was increased from 6.25 mg twice daily to 12.5 mg twice daily, and he was started on Eliquis 5 mg twice daily.  Echocardiogram was ordered and pending. ? ?On 3/17 his nonfunctional left tunneled HD catheter was removed and a right IJ tunneled HD catheter was placed by IR. ? ?Patient states he has been feeling well.  He completed his usual HD session on 3/20.  When he awoke this morning (3/22) he saw bleeding at his HD catheter site.  He came to the ED for further evaluation.  While in the ED he became bradycardic with heart rate into the 30s.  Cardiology were consulted and recommended admission for overnight observation. ? ?Patient otherwise states that he is feeling well.  He denies any chest pain, palpitations, dyspnea, nausea, vomiting, abdominal pain.  He states that he still does make urine on occasion.  He has not had any peripheral edema. ? ?ED Course  Labs/Imaging on admission: I have personally reviewed following labs and imaging studies. ? ?Initial vitals showed BP 138/70, pulse 88, RR 18, temp 97.6 ?F, SPO2 100% on room air. While in the ED patient developed bradycardia with rate in the 30s. ? ?Labs show sodium 140, potassium 5.5, bicarb 26, BUN 46, creatinine 6.59, serum glucose 81 AST  20, ALT 9, alk phos 160, total bilirubin 0.5, WBC 5.4, hemoglobin 13.2, platelets 215,000, INR 1.7. ? ?Patient was given 500 cc normal saline, 10 g oral Lokelma.  Thrombi pad placed at vascular access site with control of bleeding.  EDP discussed with on-call cardiology who recommended observation overnight and will evaluate with EP.  The hospitalist service was consulted to admit for further evaluation and management. ? ?Review of Systems: All systems reviewed and are negative except as documented in history of present illness above. ? ? ?Past Medical History:  ?Diagnosis Date  ? A-fib ([place])   ? Edema   ? ESRD (end stage renal disease) ([place])   ? History of insertion of tunneled central venous catheter (CVC) with port   ? Hypotension   ? ? ?Past Surgical History:  ?Procedure Laterality Date  ? IR FLUORO GUIDE CV LINE RIGHT  06/04/2021  ? IR REMOVAL TUN CV CATH W/O FL  06/04/2021  ? IR US GUIDE VASC ACCESS RIGHT  06/04/2021  ? ? ?Social History: ? reports that he has quit smoking. His smoking use included cigarettes. He does not have any smokeless tobacco history on file. No history on file for alcohol use and drug use. ? ?Allergies  ?Allergen Reactions  ? Povidone Iodine Rash  ?  Betadine   ? ? ?Family History  ?Problem Relation Age of Onset  ? Hypertension Mother   ? Diabetes Mother   ? Heart attack Father   ? ? ? ?Prior to Admission medications   ?Medication Sig  Start Date End Date Taking? Authorizing Provider  ?apixaban (ELIQUIS) 5 MG TABS tablet Take 1 tablet (5 mg total) by mouth 2 (two) times daily. 06/01/21  Yes [name], MD  ?Lorin Picket 1 GM 210 MG(Fe) tablet Take 420 mg by mouth 3 (three) times daily with meals. 04/26/21  Yes [provider]  ?carvedilol (COREG) 12.5 MG tablet Take 1 tablet (12.5 mg total) by mouth 2 (two) times daily. 06/01/21  Yes [name], MD  ?HYDROcodone-acetaminophen (NORCO) 10-325 MG tablet Take 1 tablet by mouth 2 (two) times daily as needed for moderate pain.   Yes  [provider]  ?vitamin C (ASCORBIC ACID) 500 MG tablet Take 500 mg by mouth daily.   Yes [provider]  ?zinc sulfate 220 (50 Zn) MG capsule Take 220 mg by mouth daily.   Yes [provider]  ? ? ?Physical Exam: ?Vitals:  ? 06/09/21 1600 06/09/21 1615 06/09/21 1645 06/09/21 1735  ?BP: (!) 120/94 133/80 108/78 119/74  ?Pulse:    (!) 46  ?Resp: _0 ?Temp:      ?TempSrc:      ?SpO2:    98%  ? ?Constitutional: Resting in bed, NAD, calm, comfortable ?Eyes: PERRL, lids and conjunctivae normal ?ENMT: Mucous membranes are moist. Posterior pharynx clear of any exudate or lesions.Normal dentition.  ?Neck: normal, supple, no masses. ?Respiratory: clear to auscultation bilaterally, no wheezing, no crackles. Normal respiratory effort. No accessory muscle use.  ?Cardiovascular: Bradycardic with rate mid 40s on telemetry at time of admission, no murmurs / rubs / gallops. No extremity edema. 2+ pedal pulses.  Right IJ HD cath in place with dressing, dried blood near site. ?Abdomen: no tenderness, no masses palpated. No hepatosplenomegaly.  ?Musculoskeletal: no clubbing / cyanosis. No joint deformity upper and lower extremities. Good ROM, no contractures. Normal muscle tone.  ?Skin: Warm, dry ?Neurologic: CN 2-12 grossly intact. Sensation intact. Strength 5/5 in all 4.  ?Psychiatric: Normal judgment and insight. Alert and oriented x 3. Normal mood.  ? ?EKG: Personally reviewed.  Bradycardia with rate 39, possible junctional rhythm with IVCD and questionable LBBB.  Rate is slower when compared to prior. ? ?Assessment/Plan ?Principal Problem: ?  Bradycardia ?Active Problems: ?  Paroxysmal atrial fibrillation ([place]) ?  Hyperkalemia ?  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 ([place]) ?  Complication of vascular dialysis catheter, initial encounter ?  ?[name] is a 71 y.o. male with medical history significant for ESRD on MWF HD, paroxysmal atrial fibrillation recently started on Eliquis, pressure ulcers to  right hip/sacrum/buttocks who is admitted with bradycardia. ? ?Assessment and Plan: ?* Bradycardia ?Paroxysmal atrial fibrillation ?Patient bradycardic with heart rate in the 30s while in the ED.  Question junctional rhythm.  Recently had increased dose of Coreg.  He is asymptomatic. ?-Cardiology consulted, to discuss with EP ?-Hold Coreg ?-Eliquis on hold given HD catheter site bleeding ?-Give atropine if develops symptoms or becomes hypotensive ? ?Hyperkalemia ?Potassium 5.5 on admission in setting of ESRD.  He was given 1 dose of Lokelma in the ED. ?-Redose Lokelma, definitive management with hemodialysis ? ?Complication of vascular dialysis catheter, initial encounter ?Initially presented with bleeding from HD catheter site.  Bleeding controlled with thrombi-pad.  Recently started on Eliquis 5 mg twice daily. ?-Holding Eliquis for now ? ?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 ([place]) ?Last HD on 3/20.  Currently no emergent need for HD tonight.  Consult to nephrology in a.m. for either make-up HD in hospital versus outpatient dialysis center. ? ?  DVT prophylaxis: SCDs Start: 06/09/21 1823 ?Code Status: Full code, confirmed with patient on admission ?Family Communication: Discussed with patient, he has discussed with family ?Disposition Plan: From home and likely discharge to home pending clinical progress ?Consults called: Cardiology ?Severity of Illness: ?The appropriate patient status for this patient is OBSERVATION. Observation status is judged to be reasonable and necessary in order to provide the required intensity of service to ensure the patient's safety. The patient's presenting symptoms, physical exam findings, and initial radiographic and laboratory data in the context of their medical condition is felt to place them at decreased risk for further clinical deterioration. Furthermore, it is anticipated that the patient will be medically stable for discharge from the hospital within 2 midnights of admission.   ?Zada Finders MD ?[facility] ? ?If 7PM-7AM, please contact night-coverage ?[email or web address] ? ?06/09/2021, 6:29 PM  ?

## 2021-06-09 NOTE — ED Notes (Signed)
This RN noted monitor alarming for bradycardia via the pulse ox. Entered pt room and placed pt on 5 lead cardiac monitor. Pt noted to be bradycardic to the low 40's/high 30s. Asymptomatic at this time. BP stable. MD Pearline Cables and primary RN notified of finding ?

## 2021-06-09 NOTE — ED Provider Notes (Signed)
Signout from Dr. Ursula Beath.  71 year old male end-stage renal disease dialysis Monday Wednesday Friday here for bleeding from his vascular access.  Bleeding is stopped.  Potassium mildly elevated and given Lokelma.  Patient's heart rate into the 30s.  Cardiology was consulted and are recommending admission for observation and possible medication adjustments.  Unassigned paged for admission.   ?Physical Exam  ?BP 119/74 (BP Location: Right Arm)   Pulse (!) 46   Temp 97.6 ?F (36.4 ?C) (Oral)   Resp 20   SpO2 98%  ? ?Physical Exam ? ?Procedures  ?Procedures ? ?ED Course / MDM  ? ?Clinical Course as of 06/09/21 1738  ?Wed Jun 09, 2021  ?1621 Patient found be bradycardic, heart rate between 40-50.  Reports that his heart rate often fluctuates.  He has no chest pain or dyspnea, no lightheadedness.  No palpitations.  Bleeding controlled at vascular access site.  EKG reviewed from 3/23 with similar morphology although rate has slowed today.  No STEMI [SG]  ?  ?Clinical Course User Index ?[SG] Jeanell Sparrow, DO  ? ?Medical Decision Making ?Risk ?Prescription drug management. ?Decision regarding hospitalization. ? ? ?Discussed with Dr. Posey Pronto [facility] hospital[facility] who will evaluate patient for admission. ? ?Patient seen by Dr. Ellyn Hack.  Note to follow.  He said the patient should have a washout of the Coreg and amiodarone.  Will need a outpatient monitor. ? ? ? ? ?  ?Hayden Rasmussen, MD ?06/10/21 1059 ? ?

## 2021-06-09 NOTE — ED Notes (Signed)
HD catheter dressing replaced. Pt hr in 40-50's MD aware  ?

## 2021-06-09 NOTE — Assessment & Plan Note (Addendum)
Last HD on 06/07/21.  Patient has decided to leave [place]. ?

## 2021-06-09 NOTE — ED Provider Notes (Signed)
?[name] ?Provider Note ? ? ?CSN: [id number] ?Arrival date & time: 06/09/21  1034 ? ?  ? ?History ? ?Chief Complaint  ?Patient presents with  ? Vascular Access Problem  ? Post-op Problem  ? ? ?[name] is a 71 y.o. male. ? ?This is a 71 y.o. male with significant medical history as below, including ESRD on HD Monday Wednesday Friday, A-fib who presents to the ED with complaint of bleeding dialysis catheter.  Last dialysis session was Monday, received full session.  Did not go to dialysis today.  Woke up this morning his dialysis access on his chest was bleeding.  He is on DOAC.  No chest pain, dyspnea, nausea or vomiting.  No change in bowel or bladder function.  Taking all home medications as prescribed.  Denies trauma to vascular access site ? ? ? ? ? ?Past Medical History: ?No date: A-fib [facility]) ?No date: Edema ?No date: ESRD (end stage renal disease) ([place]) ?No date: History of insertion of tunneled central venous catheter  ?(CVC) with port ?No date: Hypotension ? ?Past Surgical History: ?06/04/2021: IR FLUORO GUIDE CV LINE RIGHT ?06/04/2021: IR REMOVAL TUN CV CATH W/O FL ?06/04/2021: IR US GUIDE VASC ACCESS RIGHT  ? ? ?The history is provided by the patient and the spouse. No language interpreter was used.  ? ?  ? ?Home Medications ?Prior to Admission medications   ?Medication Sig Start Date End Date Taking? Authorizing Provider  ?apixaban (ELIQUIS) 5 MG TABS tablet Take 1 tablet (5 mg total) by mouth 2 (two) times daily. 06/01/21  Yes [name], MD  ?Lorin Picket 1 GM 210 MG(Fe) tablet Take 420 mg by mouth 3 (three) times daily with meals. 04/26/21  Yes [provider]  ?carvedilol (COREG) 12.5 MG tablet Take 1 tablet (12.5 mg total) by mouth 2 (two) times daily. 06/01/21  Yes [name], MD  ?HYDROcodone-acetaminophen (NORCO) 10-325 MG tablet Take 1 tablet by mouth 2 (two) times daily as needed for moderate pain.   Yes [provider]  ?vitamin C  (ASCORBIC ACID) 500 MG tablet Take 500 mg by mouth daily.   Yes [provider]  ?zinc sulfate 220 (50 Zn) MG capsule Take 220 mg by mouth daily.   Yes [provider]  ?   ? ?Allergies    ?Povidone iodine   ? ?Review of Systems   ?Review of Systems  ?Constitutional:  Negative for chills and fever.  ?HENT:  Negative for ear pain and sore throat.   ?Eyes:  Negative for pain and visual disturbance.  ?Respiratory:  Negative for cough and shortness of breath.   ?Cardiovascular:  Negative for chest pain and palpitations.  ?Gastrointestinal:  Negative for abdominal pain and vomiting.  ?Genitourinary:  Negative for dysuria and hematuria.  ?Musculoskeletal:  Negative for arthralgias and back pain.  ?Skin:  Negative for color change and rash.  ?     Bleeding HD catheter site  ?Neurological:  Negative for seizures and syncope.  ?All other systems reviewed and are negative. ? ?Physical Exam ?Updated Vital Signs ?BP 108/78   Pulse 60   Temp 97.6 ?F (36.4 ?C) (Oral)   Resp 11   SpO2 98%  ?Physical Exam ?Vitals and nursing note reviewed.  ?Constitutional:   ?   General: He is not in acute distress. ?   Appearance: He is well-developed.  ?HENT:  ?   Head: Normocephalic and atraumatic.  ?   Right Ear: External ear  normal.  ?   Left Ear: External ear normal.  ?   Mouth/Throat:  ?   Mouth: Mucous membranes are moist.  ?Eyes:  ?   General: No scleral icterus. ?Cardiovascular:  ?   Rate and Rhythm: Normal rate and regular rhythm.  ?   Pulses: Normal pulses.  ?   Heart sounds: Normal heart sounds.  ?Pulmonary:  ?   Effort: Pulmonary effort is normal. No respiratory distress.  ?   Breath sounds: Normal breath sounds.  ?Chest:  ? ? ?Abdominal:  ?   General: Abdomen is flat.  ?   Palpations: Abdomen is soft.  ?   Tenderness: There is no abdominal tenderness.  ?Musculoskeletal:     ?   General: Normal range of motion.  ?   Cervical back: Normal range of motion.  ?   Right lower leg: No edema.  ?   Left lower leg: No  edema.  ?Skin: ?   General: Skin is warm and dry.  ?   Capillary Refill: Capillary refill takes less than 2 seconds.  ?Neurological:  ?   Mental Status: He is alert and oriented to person, place, and time.  ?Psychiatric:     ?   Mood and Affect: Mood normal.     ?   Behavior: Behavior normal.  ? ? ?ED Results / Procedures / Treatments   ?Labs ?(all labs ordered are listed, but only abnormal results are displayed) ?Labs Reviewed  ?CBC WITH DIFFERENTIAL/PLATELET - Abnormal; Notable for the following components:  ?    Result Value  ? MCH 25.6 (*)   ? RDW 15.9 (*)   ? All other components within normal limits  ?COMPREHENSIVE METABOLIC PANEL - Abnormal; Notable for the following components:  ? Potassium 5.5 (*)   ? BUN 46 (*)   ? Creatinine, Ser 6.59 (*)   ? Albumin 3.0 (*)   ? Alkaline Phosphatase 160 (*)   ? GFR, Estimated 8 (*)   ? All other components within normal limits  ?PROTIME-INR - Abnormal; Notable for the following components:  ? Prothrombin Time 19.6 (*)   ? INR 1.7 (*)   ? All other components within normal limits  ? ? ?EKG ?EKG Interpretation ? ?Date/Time:  Wednesday June 09 2021 16:09:12 EDT ?Ventricular Rate:  39 ?PR Interval:    ?QRS Duration: 134 ?QT Interval:  524 ?QTC Calculation: 422 ?R Axis:   -26 ?Text Interpretation: Junctional rhythm IVCD, consider atypical LBBB No old tracing to compare Confirmed by Wynona Dove [phone]) on 06/09/2021 4:40:28 PM ? ?Radiology ?No results found. ? ?Procedures ?Marland KitchenCritical Care ?Performed by: Jeanell Sparrow, DO ?Authorized by: Jeanell Sparrow, DO  ? ?Critical care provider statement:  ?  Critical care time (minutes):  30 ?  Critical care time was exclusive of:  Separately billable procedures and treating other patients ?  Critical care was necessary to treat or prevent imminent or life-threatening deterioration of the following conditions: bradycardia. ?  Critical care was time spent personally by me on the following activities:  Development of treatment plan with  patient or surrogate, discussions with consultants, evaluation of patient's response to treatment, examination of patient, ordering and review of laboratory studies, ordering and review of radiographic studies, ordering and performing treatments and interventions, pulse oximetry, re-evaluation of patient's condition, review of old charts and obtaining history from patient or surrogate  ? ? ?Medications Ordered in ED ?Medications  ?Thrombi-Pad 3"X3" pad 1 each (has no administration in time  range)  ?sodium chloride 0.9 % bolus 500 mL (has no administration in time range)  ?sodium zirconium cyclosilicate (LOKELMA) packet 10 g (10 g Oral Given 06/09/21 1439)  ? ? ?ED Course/ Medical Decision Making/ A&P ?Clinical Course as of 06/09/21 1724  ?Wed Jun 09, 2021  ?1621 Patient found be bradycardic, heart rate between 40-50.  Reports that his heart rate often fluctuates.  He has no chest pain or dyspnea, no lightheadedness.  No palpitations.  Bleeding controlled at vascular access site.  EKG reviewed from 3/23 with similar morphology although rate has slowed today.  No STEMI [SG]  ?  ?Clinical Course User Index ?[SG] Wynona Dove A, DO  ? ?                        ?Medical Decision Making ?Risk ?Prescription drug management. ?Decision regarding hospitalization. ? ? ?Initial Impression and Ddx ?This patient presents to the Emergency Department for the above complaint. This involves an extensive number of treatment options and is a complaint that carries with it a high risk of complications and morbidity. Vital signs were reviewed.  ? ?Serious etiologies considered.  ? ?Patient PMH that increases complexity of ED encounter:  ESRD on HD ? ?Social determinants of health include - HD pt  ? ?Previous records obtained and reviewed  ? ?Additional history obtained from spouse ? ? ?Interpretation of Diagnostics ?Lab results that were available during my care of the patient were visualized by me and considered in my medical decision  making. ?  ?Cardiac monitoring reviewed and interpreted personally which shows NSR ? ? ? ? ?Patient Reassessment and Ultimate Disposition/Management ? ?Patient management required discussion with the following servic

## 2021-06-09 NOTE — Hospital Course (Addendum)
[name] is a 71 y.o. male with a medical history of atrial fibrillation recently started on DOAC with amiodarone stopped and carvdilol increased, as well as ESRD on HD MWF.  ? ?Patient presented to the ED this AM with bleeding from his HD catheter. Patient last received HD two days prior to admission. He did not go to HD this AM. Patient woke up this AM and noticed that his chest was bleeding. He denied chest pain, dyspnea, palpitations, presyncope, or syncope, N/V or changes in bowel or bladder function.  ? ? ?Patient was found to have HR in the 40s and EKG showed HR of 39 with . Patient had mildly elevated potassium and was given a 1x dose of lokelma. He remained asymptomatic through thhis event.  ? ?Cardiology was consulted given patient's bradycardia.  ? ?This is likely in the setting of his recently increased carvedilol.  ? ? here to establish cardiovascular care.  He had recently moved here from [place].  He experienced a long hospitalization being treated for sepsis.  This his primary care provider here recently.  He has been doing fairly well.  He has some slight tenderness around his left chest dialysis access.  He denies any other types of chest pain.  He denies any significant shortness of breath.  He has had no peripheral edema or paroxysmal nocturnal dyspnea.  He denies any palpitations.  He has had no signs or symptoms of stroke.  He is required no emergency room visits or hospitalizations since his discharge from [place].  He has been otherwise well and without complaints.  He is tolerating dialysis well. ? ?Recommend patient transition to metoprolol tartrate 25mg  BID do not start for a couple of days.  ? ?Coreg 6.25mg  BID,  ?

## 2021-06-09 NOTE — Telephone Encounter (Signed)
Patient's daughter contacted our office stating her dad had a Panacea placed by Dr. Virl Cagey on Friday and the Cath has started bleeding. She states that there is a significant amount of blood coming from the Cath and requested for patient to be seen today in our office. I advised that the patient has not been est. With our office and she should proceed to the hospital for evaluation of the Cath. The daughter voiced her understanding and stated she will take him to the hospital as we speak.  ?

## 2021-06-09 NOTE — Discharge Instructions (Signed)
It was a pleasure caring for you today in the emergency department. ° °Please return to the emergency department for any worsening or worrisome symptoms. ° ° °

## 2021-06-09 NOTE — ED Provider Triage Note (Signed)
Emergency Medicine Provider Triage Evaluation Note ? ?[name] , a 71 y.o. male  was evaluated in triage.  Pt complains of bleeding from his dialysis catheter.  Patient had a left-sided catheter however on this was replaced with a right-sided catheter on Friday.  He then had dialysis through this catheter on Saturday as well as Monday.  This morning he woke up and noted that he was having bleeding from the site.  Recently started Eliquis.  Says that it is painful. ? ?Review of Systems  ?Positive: Pain and bleeding ?Negative: Shortness of breath and dizziness ? ?Physical Exam  ?BP 138/70   Pulse 88   Temp 97.6 ?F (36.4 ?C) (Oral)   Resp 18   SpO2 100%  ?Gen:   Awake, no distress   ?Resp:  Normal effort  ?MSK:   Moves extremities without difficulty  ?Other:  Catheter appears in place.  Blood clotted off, does not appear to be actively bleeding.  Well-healed left-sided site ? ?Medical Decision Making  ?Medically screening exam initiated at 10:45 AM.  Appropriate orders placed.  Kelten Enochs was informed that the remainder of the evaluation will be completed by another provider, this initial triage assessment does not replace that evaluation, and the importance of remaining in the ED until their evaluation is complete. ? ? ?  ?Rhae Hammock, PA-C ?06/09/21 1047 ? ?

## 2021-06-09 NOTE — Assessment & Plan Note (Addendum)
Potassium 5.5 on admission.  Patient received Lokelma x1 in the ED.  Repeat potassium was 4.9 this morning. ?

## 2021-06-09 NOTE — Assessment & Plan Note (Addendum)
Paroxysmal atrial fibrillation ?Patient bradycardic with heart rate in the 30s while in the ED.  Question junctional rhythm.  Recently had increased dose of Coreg.  He was asymptomatic.  Cardiology was consulted and patient was placed in observation.  Plan was to discuss with electrophysiology cardiology but despite explaining the importance of discussing this further patient left AGAINST MEDICAL ADVICE. ? ?

## 2021-06-10 DIAGNOSIS — L8990 Pressure ulcer of unspecified site, unspecified stage: Secondary | ICD-10-CM | POA: Diagnosis present

## 2021-06-10 LAB — CBC
HCT: 40.9 % (ref 39.0–52.0)
Hemoglobin: 11.9 g/dL — ABNORMAL LOW (ref 13.0–17.0)
MCH: 24.7 pg — ABNORMAL LOW (ref 26.0–34.0)
MCHC: 29.1 g/dL — ABNORMAL LOW (ref 30.0–36.0)
MCV: 85 fL (ref 80.0–100.0)
Platelets: 208 10*3/uL (ref 150–400)
RBC: 4.81 MIL/uL (ref 4.22–5.81)
RDW: 15.9 % — ABNORMAL HIGH (ref 11.5–15.5)
WBC: 6.8 10*3/uL (ref 4.0–10.5)
nRBC: 0 % (ref 0.0–0.2)

## 2021-06-10 LAB — TSH: TSH: 0.234 u[IU]/mL — ABNORMAL LOW (ref 0.350–4.500)

## 2021-06-10 LAB — BASIC METABOLIC PANEL
Anion gap: 11 (ref 5–15)
BUN: 56 mg/dL — ABNORMAL HIGH (ref 8–23)
CO2: 27 mmol/L (ref 22–32)
Calcium: 8.6 mg/dL — ABNORMAL LOW (ref 8.9–10.3)
Chloride: 104 mmol/L (ref 98–111)
Creatinine, Ser: 7.54 mg/dL — ABNORMAL HIGH (ref 0.61–1.24)
GFR, Estimated: 7 mL/min — ABNORMAL LOW (ref >60)
Glucose, Bld: 94 mg/dL (ref 70–99)
Potassium: 4.9 mmol/L (ref 3.5–5.1)
Sodium: 142 mmol/L (ref 135–145)

## 2021-06-10 LAB — MAGNESIUM: Magnesium: 2.3 mg/dL (ref 1.7–2.4)

## 2021-06-10 LAB — HIV ANTIBODY (ROUTINE TESTING W REFLEX): HIV Screen 4th Generation wRfx: NONREACTIVE

## 2021-06-10 MED ORDER — CHLORHEXIDINE GLUCONATE CLOTH 2 % EX PADS: 6.0000 | MEDICATED_PAD | Freq: Every day | CUTANEOUS | Status: DC

## 2021-06-10 NOTE — Progress Notes (Signed)
PT Cancellation Note ? ?Patient Details ?Name: [name] ?MRN: [id number] ?DOB: Dec 16, 1950 ? ? ?Cancelled Treatment:    Reason Eval/Treat Not Completed: Other (comment) (pt initially agreeable to mobility to [facility] as is his baseline. However, after MD arrival and discussion of further workup pt stating plans to leave AMA and therapy no longer desired) ? ? ?[name] ?06/10/2021, 9:25 AM ?Erik Nessel P, PT ?Acute Rehabilitation Services ?Pager: [phone] ?Office: [phone] ? ?

## 2021-06-10 NOTE — Progress Notes (Signed)
[name] is choosing to leave AMA. He has been fully informed of the risks of leaving by Dr. Louanne Belton. He is also  refusing to sign the AMA papers because he does not like how the paper is worded. He takes full responsibility for his decision to leave AMA. Dr. Louanne Belton aware of the entire situation.  ?

## 2021-06-10 NOTE — Discharge Summary (Signed)
?Physician Discharge Summary ?  ?Patient: [name] MRN: [id number] DOB: [birth year]  ?Admit date:     06/09/2021  ?Discharge date: 06/10/21  ?Discharge Physician: Corrie Mckusick Abran Gavigan  ? ?PCP: Traci Sermon, PA-C  ? ?Recommendations at discharge:  ? ?Patient left AGAINST MEDICAL ADVICE despite trying to explain the importance of further assessment.  Risk versus benefits were explained to the patient thoroughly and was advised to come to the hospital for worsening symptoms.  Patient has understood the risk but wishes to get discharged [place] ? ?Discharge Diagnoses: ?Principal Problem: ?  Bradycardia ?Active Problems: ?  Paroxysmal atrial fibrillation ([place]) ?  Hyperkalemia ?  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 ([place]) ?  Complication of vascular dialysis catheter, initial encounter ?  Pressure injury of skin ? ?Resolved Problems: ?  * No resolved hospital problems. * ? ?Hospital Course: ?[name] is a 71 y.o. male with medical history significant for ESRD on MWF HD, paroxysmal atrial fibrillation recently started on Eliquis, pressure ulcers to right hip/sacrum/buttocks who is admitted with bradycardia. ? ?Assessment and Plan: ?* Bradycardia ?Paroxysmal atrial fibrillation ?Patient bradycardic with heart rate in the 30s while in the ED.  Question junctional rhythm.  Recently had increased dose of Coreg.  He was asymptomatic.  Cardiology was consulted and patient was placed in observation.  Plan was to discuss with electrophysiology cardiology but despite explaining the importance of discussing this further patient left AGAINST MEDICAL ADVICE. ? ? ?Paroxysmal atrial fibrillation (H[facility]) ?On beta-blocker as outpatient. ? ?Hyperkalemia ?Potassium 5.5 on admission.  Patient received Lokelma x1 in the ED.  Repeat potassium was 4.9 this morning. ? ?Complication of vascular dialysis catheter, initial encounter ?Initially presented with bleeding from HD catheter site.  Bleeding controlled with thrombi-pad.  Recently  started on Eliquis 5 mg twice daily.  No active bleeding prior to his discharge.  Patient however has decided to leave [place] ? ? ?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 ([place]) ?Last HD on 06/07/21.  Patient has decided to leave [place]. ? ?Pressure injury of skin ?Pressure Injury 06/09/21 Sacrum Medial Stage 2 -  Partial thickness loss of dermis presenting as a shallow open injury with a red, pink wound bed without slough. (Active)  ?06/09/21 2250  ?Location: Sacrum  ?Location Orientation: Medial  ?Staging: Stage 2 -  Partial thickness loss of dermis presenting as a shallow open injury with a red, pink wound bed without slough.  ?Wound Description (Comments):   ?Present on Admission: Yes  ?   ?Pressure Injury 06/09/21 Thigh Right;Posterior;Proximal Stage 2 -  Partial thickness loss of dermis presenting as a shallow open injury with a red, pink wound bed without slough. (Active)  ?06/09/21 2250  ?Location: Thigh  ?Location Orientation: Right;Posterior;Proximal  ?Staging: Stage 2 -  Partial thickness loss of dermis presenting as a shallow open injury with a red, pink wound bed without slough.  ?Wound Description (Comments):   ?Present on Admission:   ?   ?Pressure Injury 06/09/21 Thigh Left;Posterior;Proximal Stage 1 -  Intact skin with non-blanchable redness of a localized area usually over a bony prominence. (Active)  ?06/09/21 2200  ?Location: Thigh  ?Location Orientation: Left;Posterior;Proximal  ?Staging: Stage 1 -  Intact skin with non-blanchable redness of a localized area usually over a bony prominence.  ?Wound Description (Comments):   ?Present on Admission: Yes  ? ? ? ? ? ?Consultants: Cardiology ?Procedures performed: None ?Disposition:  Left AGAINST MEDICAL ADVICE. ?Diet recommendation:  ?Cardiac diet ? ?DISCHARGE MEDICATION: ?Allergies as  of 06/10/2021   ? ?   Reactions  ? Povidone Iodine Rash  ? Betadine   ? ?  ?Patient left AGAINST MEDICAL ADVICE. ? ? ? ? ? ? Follow-up Information   ? ?  [name], Vista Mink, PA-C. Schedule an appointment as soon as possible for a visit in 2 days.   ?Specialty: Physician Assistant ?Why: Follow up from ER visit ?Contact information: ?[address] ?[place] [address] ?[phone] ? ? ?  ?  ? ? Go to  [facility].   ?Specialty: Emergency Medicine ?Why: As needed, If symptoms worsen ?Contact information: ?[address] ?[id number] mc ?[facility] ?[phone] ? ?  ?  ? ?  ?  ? ?  ? ?Discharge Exam: ?Filed Weights  ? 06/09/21 1845 06/09/21 2232  ?Weight: 72.5 kg 69 kg  ? ?Unable to examine. ? ?Condition at discharge:  Left [place]. ? ?The results of significant diagnostics from this hospitalization (including imaging, microbiology, ancillary and laboratory) are listed below for reference.  ? ?Imaging Studies: ?DG Pelvis 1-2 Views ? ?Result Date: 05/25/2021 ?CLINICAL DATA:  Sacral ulcer. Two views to assess stage III pressure ulcer of sacrum. Patient being seen at [facility]. EXAM: PELVIS - 1-2 VIEW COMPARISON:  None. FINDINGS: There is diffuse decreased bone mineralization. Within this limitation, there appears to be irregularity of the posterior soft tissues along the course of the distal sacrum and coccyx. The decreased bone mineralization limits evaluation for cortical erosion. 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 Dense vascular calcifications are noted. IMPRESSION: Diffuse decreased bone mineralization. This limits evaluation for cortical erosion in the sacrum and coccyx, however no definitive cortical erosion is identified. Electronically Signed   By: Yvonne Kendall M.D.   On: 05/25/2021 18:12  ? ?IR Fluoro Guide CV Line Right ? ?Result Date: 06/04/2021 ?INDICATION: 71 year old male with a history renal failure referred for new hemodialysis  catheter placement EXAM: IMAGE GUIDED PLACEMENT OF RIGHT IJ TUNNELED HEMODIALYSIS CATHETER. REMOVAL OF NONFUNCTIONAL LEFT HEMODIALYSIS CATHETER. MEDICATIONS: 2 g Ancef. The antibiotic was given in an appropriate time interval prior to skin puncture. ANESTHESIA/SEDATION: Moderate (conscious) sedation was not employed during this procedure. A total of Versed 0 mg and Fentanyl 25 mcg was administered intravenously by the radiology nurse. Total intra-service moderate Sedation Time: 0 minutes. The patient's level of consciousness and vital signs were monitored continuously by radiology nursing throughout the procedure under my direct supervision. FLUOROSCOPY: Radiation Exposure Index (as provided by the fluoroscopic device): 1.0 mGy Kerma COMPLICATIONS: None PROCEDURE: Informed written consent was obtained from the patient after a discussion of the risks, benefits, and alternatives to treatment. Questions regarding the procedure were encouraged and answered. The right neck and chest were prepped with Hibiclens in a sterile fashion, and a sterile drape was applied covering the operative field. Maximum barrier sterile technique with sterile gowns and gloves were used for the procedure. A timeout was performed prior to the initiation of the procedure. Ultrasound survey was performed. The right internal jugular vein was confirmed to be patent, with images stored and sent to PACS. Micropuncture kit was utilized to access the right internal jugular vein under direct, real-time ultrasound guidance after the overlying soft tissues were anesthetized with 1% lidocaine with epinephrine. Stab incision was made with 11 blade scalpel. Microwire was passed centrally. The microwire was then marked to measure appropriate internal catheter  length. External tunneled length was estimated. A total tip to cuff length of 19 cm was selected. 035 guidewire was advanced to the level of the IVC. Skin and subcutaneous tissues of chest wall below  the clavicle were generously infiltrated with 1% lidocaine for local anesthesia. A small stab incision was made with 11 blade scalpel. The selected hemodialysis catheter was tunneled in a retrograde fashion fr

## 2021-06-10 NOTE — Assessment & Plan Note (Signed)
On beta-blocker as outpatient. ?

## 2021-06-10 NOTE — Progress Notes (Signed)
Dr. Louanne Belton aware that the patient is refusing everything and wants to leave AMA.  ?

## 2021-06-10 NOTE — Consult Note (Signed)
WOC Nurse Consult Note: ?Patient receiving care in [place] ?Notes from [facility] ?"Right hip wound/osteomyelitis ?Martin Majestic for debridement on 10/10 with general surgery ?- CT hip consistent with osteomyelitis so orthopedic surgery was consulted regarding this ?- He went for aspiration of the hip joint by interventional radiology to assess for septic arthritis, follow culture results from this have been negative. ?- S/p repeat debridement 11/2. Follow studies. No further plans from [facility] standpoint. ?- 6 weeks of IV Merrem from 10/17 to be given with dialysis." ?Reason for Consult: Drainage from right hip wound.  ?Wound type: Surgical hip wound ?Pressure Injury POA: NA ?Measurement: 9 cm x 0.5 cm ?Wound bed: Could not view the complete wound however it appeared to be pink and clean. Patient had Hydrofera Blue (not in [place]) on it and stated his daughter put it on there this morning and he didn't want me to take it off.  ?Drainage (amount, consistency, odor)  ?Periwound: intact ?Dressing procedure/placement/frequency: ?Patient states he is going home and did not want the dressing changed.. States his daughter changed it this morning.  ? ?Monitor the wound area(s) for worsening of condition such as: ?Signs/symptoms of infection, increase in size, development of or worsening of odor, ?development of pain, or increased pain at the affected locations.   ?Notify the medical team if any of these develop. ? ?Thank you for the consult. [facility] nurse will not follow at this time.   ?Please re-consult the [place] team if needed. ? ?Cathlean [place]. Tamala Julian, MSN, RN, CMSRN, AGCNS, WTA ?Wound Treatment Associate ?Pager [phone]   ? ? ?  ?

## 2021-06-10 NOTE — Progress Notes (Signed)
The patient is still saying he is leaving AMA. He is waiting on his sister to come get him. He asked for his clothes to get dressed and did not want any help.  ?

## 2021-06-10 NOTE — Progress Notes (Signed)
OT Cancellation Note ? ?Patient Details ?Name: [name] ?MRN: [id number] ?DOB: December 06, 1950 ? ? ?Cancelled Treatment:    Reason Eval/Treat Not Completed: Patient declined, no reason specified. Pt currently refusing services.  ? ?Tyrone Schimke, OT ?Acute Rehabilitation Services ?Office: [phone] ? ?06/10/2021, 8:47 AM ?

## 2021-06-10 NOTE — Assessment & Plan Note (Signed)
Pressure Injury 06/09/21 Sacrum Medial Stage 2 -  Partial thickness loss of dermis presenting as a shallow open injury with a red, pink wound bed without slough. (Active)  ?06/09/21 2250  ?Location: Sacrum  ?Location Orientation: Medial  ?Staging: Stage 2 -  Partial thickness loss of dermis presenting as a shallow open injury with a red, pink wound bed without slough.  ?Wound Description (Comments):   ?Present on Admission: Yes  ?   ?Pressure Injury 06/09/21 Thigh Right;Posterior;Proximal Stage 2 -  Partial thickness loss of dermis presenting as a shallow open injury with a red, pink wound bed without slough. (Active)  ?06/09/21 2250  ?Location: Thigh  ?Location Orientation: Right;Posterior;Proximal  ?Staging: Stage 2 -  Partial thickness loss of dermis presenting as a shallow open injury with a red, pink wound bed without slough.  ?Wound Description (Comments):   ?Present on Admission:   ?   ?Pressure Injury 06/09/21 Thigh Left;Posterior;Proximal Stage 1 -  Intact skin with non-blanchable redness of a localized area usually over a bony prominence. (Active)  ?06/09/21 2200  ?Location: Thigh  ?Location Orientation: Left;Posterior;Proximal  ?Staging: Stage 1 -  Intact skin with non-blanchable redness of a localized area usually over a bony prominence.  ?Wound Description (Comments):   ?Present on Admission: Yes  ? ? ?

## 2021-06-11 ENCOUNTER — Other Ambulatory Visit: Payer: Self-pay

## 2021-06-11 ENCOUNTER — Encounter (HOSPITAL_COMMUNITY): Payer: Self-pay | Admitting: Emergency Medicine

## 2021-06-11 ENCOUNTER — Observation Stay (HOSPITAL_COMMUNITY)
Admission: EM | Admit: 2021-06-11 | Discharge: 2021-06-12 | Disposition: A | Discharge status: Home / Self Care | Payer: Medicare (Managed Care) | Attending: Internal Medicine | Admitting: Internal Medicine

## 2021-06-11 ENCOUNTER — Emergency Department (HOSPITAL_COMMUNITY): Payer: Medicare (Managed Care)

## 2021-06-11 DIAGNOSIS — T82838A Hemorrhage of vascular prosthetic devices, implants and grafts, initial encounter: Principal | ICD-10-CM | POA: Insufficient documentation

## 2021-06-11 DIAGNOSIS — Y841 Kidney dialysis as the cause of abnormal reaction of the patient, or of later complication, without mention of misadventure at the time of the procedure: Secondary | ICD-10-CM | POA: Insufficient documentation

## 2021-06-11 DIAGNOSIS — N186 End stage renal disease: Secondary | ICD-10-CM | POA: Diagnosis not present

## 2021-06-11 DIAGNOSIS — Z7901 Long term (current) use of anticoagulants: Secondary | ICD-10-CM | POA: Diagnosis not present

## 2021-06-11 DIAGNOSIS — I120 Hypertensive chronic kidney disease with stage 5 chronic kidney disease or end stage renal disease: Secondary | ICD-10-CM | POA: Insufficient documentation

## 2021-06-11 DIAGNOSIS — Z20822 Contact with and (suspected) exposure to covid-19: Secondary | ICD-10-CM | POA: Diagnosis not present

## 2021-06-11 DIAGNOSIS — Z79899 Other long term (current) drug therapy: Secondary | ICD-10-CM | POA: Diagnosis not present

## 2021-06-11 DIAGNOSIS — Z87891 Personal history of nicotine dependence: Secondary | ICD-10-CM | POA: Insufficient documentation

## 2021-06-11 DIAGNOSIS — T8241XD Breakdown (mechanical) of vascular dialysis catheter, subsequent encounter: Secondary | ICD-10-CM

## 2021-06-11 DIAGNOSIS — T8241XA Breakdown (mechanical) of vascular dialysis catheter, initial encounter: Secondary | ICD-10-CM | POA: Diagnosis present

## 2021-06-11 DIAGNOSIS — Z992 Dependence on renal dialysis: Secondary | ICD-10-CM

## 2021-06-11 DIAGNOSIS — I480 Paroxysmal atrial fibrillation: Secondary | ICD-10-CM | POA: Diagnosis not present

## 2021-06-11 DIAGNOSIS — R58 Hemorrhage, not elsewhere classified: Secondary | ICD-10-CM

## 2021-06-11 LAB — I-STAT CHEM 8, ED
BUN: 59 mg/dL — ABNORMAL HIGH (ref 8–23)
Calcium, Ion: 1.08 mmol/L — ABNORMAL LOW (ref 1.15–1.40)
Chloride: 105 mmol/L (ref 98–111)
Creatinine, Ser: 9.2 mg/dL — ABNORMAL HIGH (ref 0.61–1.24)
Glucose, Bld: 90 mg/dL (ref 70–99)
HCT: 40 % (ref 39.0–52.0)
Hemoglobin: 13.6 g/dL (ref 13.0–17.0)
Potassium: 4.6 mmol/L (ref 3.5–5.1)
Sodium: 139 mmol/L (ref 135–145)
TCO2: 25 mmol/L (ref 22–32)

## 2021-06-11 LAB — BASIC METABOLIC PANEL
Anion gap: 14 (ref 5–15)
BUN: 67 mg/dL — ABNORMAL HIGH (ref 8–23)
CO2: 23 mmol/L (ref 22–32)
Calcium: 9.4 mg/dL (ref 8.9–10.3)
Chloride: 103 mmol/L (ref 98–111)
Creatinine, Ser: 8.73 mg/dL — ABNORMAL HIGH (ref 0.61–1.24)
GFR, Estimated: 6 mL/min — ABNORMAL LOW (ref >60)
Glucose, Bld: 95 mg/dL (ref 70–99)
Potassium: 4.7 mmol/L (ref 3.5–5.1)
Sodium: 140 mmol/L (ref 135–145)

## 2021-06-11 LAB — CBC
HCT: 36.9 % — ABNORMAL LOW (ref 39.0–52.0)
Hemoglobin: 11.3 g/dL — ABNORMAL LOW (ref 13.0–17.0)
MCH: 25.7 pg — ABNORMAL LOW (ref 26.0–34.0)
MCHC: 30.6 g/dL (ref 30.0–36.0)
MCV: 83.9 fL (ref 80.0–100.0)
Platelets: 170 10*3/uL (ref 150–400)
RBC: 4.4 MIL/uL (ref 4.22–5.81)
RDW: 16 % — ABNORMAL HIGH (ref 11.5–15.5)
WBC: 5.4 10*3/uL (ref 4.0–10.5)
nRBC: 0 % (ref 0.0–0.2)

## 2021-06-11 LAB — PROTIME-INR
INR: 1.5 — ABNORMAL HIGH (ref 0.8–1.2)
Prothrombin Time: 17.7 seconds — ABNORMAL HIGH (ref 11.4–15.2)

## 2021-06-11 LAB — COMPREHENSIVE METABOLIC PANEL
ALT: 8 U/L (ref 0–44)
AST: 18 U/L (ref 15–41)
Albumin: 2.7 g/dL — ABNORMAL LOW (ref 3.5–5.0)
Alkaline Phosphatase: 156 U/L — ABNORMAL HIGH (ref 38–126)
Anion gap: 14 (ref 5–15)
BUN: 68 mg/dL — ABNORMAL HIGH (ref 8–23)
CO2: 24 mmol/L (ref 22–32)
Calcium: 9.1 mg/dL (ref 8.9–10.3)
Chloride: 102 mmol/L (ref 98–111)
Creatinine, Ser: 8.94 mg/dL — ABNORMAL HIGH (ref 0.61–1.24)
GFR, Estimated: 6 mL/min — ABNORMAL LOW (ref >60)
Glucose, Bld: 113 mg/dL — ABNORMAL HIGH (ref 70–99)
Potassium: 4.8 mmol/L (ref 3.5–5.1)
Sodium: 140 mmol/L (ref 135–145)
Total Bilirubin: 0.7 mg/dL (ref 0.3–1.2)
Total Protein: 6.4 g/dL — ABNORMAL LOW (ref 6.5–8.1)

## 2021-06-11 LAB — CBC WITH DIFFERENTIAL/PLATELET
Abs Immature Granulocytes: 0.01 10*3/uL (ref 0.00–0.07)
Basophils Absolute: 0.1 10*3/uL (ref 0.0–0.1)
Basophils Relative: 1 %
Eosinophils Absolute: 0.2 10*3/uL (ref 0.0–0.5)
Eosinophils Relative: 4 %
HCT: 40.7 % (ref 39.0–52.0)
Hemoglobin: 12.1 g/dL — ABNORMAL LOW (ref 13.0–17.0)
Immature Granulocytes: 0 %
Lymphocytes Relative: 29 %
Lymphs Abs: 1.8 10*3/uL (ref 0.7–4.0)
MCH: 25.6 pg — ABNORMAL LOW (ref 26.0–34.0)
MCHC: 29.7 g/dL — ABNORMAL LOW (ref 30.0–36.0)
MCV: 86.2 fL (ref 80.0–100.0)
Monocytes Absolute: 0.5 10*3/uL (ref 0.1–1.0)
Monocytes Relative: 8 %
Neutro Abs: 3.5 10*3/uL (ref 1.7–7.7)
Neutrophils Relative %: 58 %
Platelets: 190 10*3/uL (ref 150–400)
RBC: 4.72 MIL/uL (ref 4.22–5.81)
RDW: 16.1 % — ABNORMAL HIGH (ref 11.5–15.5)
WBC: 6.1 10*3/uL (ref 4.0–10.5)
nRBC: 0 % (ref 0.0–0.2)

## 2021-06-11 LAB — HEPATITIS B SURFACE ANTIBODY,QUALITATIVE: Hep B S Ab: REACTIVE — AB

## 2021-06-11 LAB — RESP PANEL BY RT-PCR (FLU A&B, COVID) ARPGX2
Influenza A by PCR: NEGATIVE
Influenza B by PCR: NEGATIVE
SARS Coronavirus 2 by RT PCR: NEGATIVE

## 2021-06-11 LAB — MAGNESIUM: Magnesium: 2.2 mg/dL (ref 1.7–2.4)

## 2021-06-11 LAB — MRSA NEXT GEN BY PCR, NASAL: MRSA by PCR Next Gen: NOT DETECTED

## 2021-06-11 LAB — PHOSPHORUS: Phosphorus: 5.7 mg/dL — ABNORMAL HIGH (ref 2.5–4.6)

## 2021-06-11 LAB — HEPATITIS B SURFACE ANTIGEN: Hepatitis B Surface Ag: NONREACTIVE

## 2021-06-11 MED ORDER — HEPARIN SODIUM (PORCINE) 1000 UNIT/ML IJ SOLN
INTRAMUSCULAR | Status: AC
  Filled 2021-06-11: qty 3

## 2021-06-11 MED ORDER — OXYCODONE HCL 5 MG PO TABS
5.0000 mg | ORAL_TABLET | Freq: Once | ORAL | Status: AC
  Administered 2021-06-11: 5 mg via ORAL
  Filled 2021-06-11: qty 1

## 2021-06-11 MED ORDER — APIXABAN 5 MG PO TABS
5.0000 mg | ORAL_TABLET | Freq: Two times a day (BID) | ORAL | Status: DC
Start: 2021-06-11 — End: 2021-06-11
  Administered 2021-06-11: 5 mg via ORAL
  Filled 2021-06-11: qty 1

## 2021-06-11 MED ORDER — TRANEXAMIC ACID FOR EPISTAXIS
500.0000 mg | Freq: Once | TOPICAL | Status: DC
  Filled 2021-06-11: qty 10

## 2021-06-11 MED ORDER — ACETAMINOPHEN 650 MG RE SUPP: 650.0000 mg | Freq: Four times a day (QID) | RECTAL | Status: DC | PRN

## 2021-06-11 MED ORDER — HYDROCODONE-ACETAMINOPHEN 10-325 MG PO TABS
1.0000 | ORAL_TABLET | Freq: Two times a day (BID) | ORAL | Status: DC | PRN
  Administered 2021-06-11: 1 via ORAL
  Filled 2021-06-11: qty 1

## 2021-06-11 MED ORDER — FERRIC CITRATE 1 GM 210 MG(FE) PO TABS
420.0000 mg | ORAL_TABLET | Freq: Three times a day (TID) | ORAL | Status: DC
Start: 2021-06-11 — End: 2021-06-12
  Administered 2021-06-11: 420 mg via ORAL
  Filled 2021-06-11: qty 2

## 2021-06-11 MED ORDER — ACETAMINOPHEN 325 MG PO TABS
650.0000 mg | ORAL_TABLET | Freq: Four times a day (QID) | ORAL | Status: DC | PRN
  Filled 2021-06-11: qty 2

## 2021-06-11 MED ORDER — CHLORHEXIDINE GLUCONATE CLOTH 2 % EX PADS
6.0000 | MEDICATED_PAD | Freq: Every day | CUTANEOUS | Status: DC
  Administered 2021-06-11: 6 via TOPICAL

## 2021-06-11 MED ORDER — CALCITRIOL 0.25 MCG PO CAPS: 1.2500 ug | ORAL_CAPSULE | ORAL | Status: DC

## 2021-06-11 NOTE — ED Provider Notes (Signed)
?[facility] ?Provider Note ? ? ?CSN: [id number] ?Arrival date & time: 06/11/21  0130 ? ?  ? ?History ? ?Chief Complaint  ?Patient presents with  ? Vascular Access Problem  ? ? ?[name] is a 71 y.o. male. ? ?The history is provided by the patient.  ?Illness ?Location:  Bleeding from dialysis catheter ?Quality:  Bleeding ?Severity:  Moderate ?Onset quality:  Gradual ?Duration: 1.5 days. 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  ?  ? ?Home Medications ?Prior to Admission medications   ?Medication Sig Start Date End Date Taking? Authorizing Provider  ?apixaban (ELIQUIS) 5 MG TABS tablet Take 1 tablet (5 mg total) by mouth 2 (two) times daily. 06/01/21   [name], MD  ?Lorin Picket 1 GM 210 MG(Fe) tablet Take 420 mg by mouth 3 (three) times daily with meals. 04/26/21   [provider]  ?carvedilol (COREG) 12.5 MG tablet Take 1 tablet (12.5 mg total) by mouth 2 (two) times daily. 06/01/21   [name], MD  ?HYDROcodone-acetaminophen (NORCO) 10-325 MG tablet Take 1 tablet by mouth 2 (two) times daily as needed for moderate pain.    [provider]  ?vitamin C (ASCORBIC ACID) 500 MG tablet Take 500 mg by mouth daily.    [provider]  ?zinc sulfate 220 (50 Zn) MG capsule Take 220 mg by mouth daily.    [provider]  ?   ? ?Allergies    ?Povidone iodine   ? ?Review of Systems   ?Review of Systems  ?Constitutional:  Negative for fever.  ?HENT:  Negative for facial swelling.   ?Eyes:  Negative for redness.  ?Respiratory:  Negative for wheezing and  stridor.   ?Cardiovascular:  Negative for leg swelling.  ?Gastrointestinal:  Negative for vomiting.  ?Skin:  Negative for rash.  ?Neurological:  Negative for facial asymmetry.  ?All other systems reviewed and are negative. ? ?Physical Exam ?Updated Vital Signs ?BP 128/87   Pulse 65   Temp 98.2 ?F (36.8 ?C) (Temporal)   Resp 18   SpO2 97%  ?Physical Exam ?Vitals and nursing note reviewed. Exam conducted with a chaperone present.  ?Constitutional:   ?   General: He is not in acute distress. ?   Appearance: Normal appearance.  ?HENT:  ?   Head: Normocephalic and atraumatic.  ?   Nose: Nose normal.  ?Eyes:  ?   Extraocular Movements: Extraocular movements intact.  ?   Conjunctiva/sclera: Conjunctivae normal.  ?Cardiovascular:  ?   Rate and Rhythm: Normal rate and regular rhythm.  ?Pulmonary:  ?   Effort: Pulmonary effort is normal.  ?   Breath sounds: Normal breath sounds.  ?   Comments: Clot with oozing at insertion of the catheter at skin  ?Abdominal:  ?   General: Bowel sounds are normal.  ?   Palpations: Abdomen is soft.  ?   Tenderness: There is no abdominal tenderness. There is no guarding.  ?Musculoskeletal:     ?   General: Normal range of motion.  ?   Cervical back: Normal range of motion and neck  supple.  ?Skin: ?   General: Skin is warm and dry.  ?   Capillary Refill: Capillary refill takes less than 2 seconds.  ?   Findings: No bruising.  ?Neurological:  ?   Mental Status: He is alert.  ?   Deep Tendon Reflexes: Reflexes normal.  ?Psychiatric:     ?   Mood and Affect: Mood normal.     ?   Behavior: Behavior normal.  ? ? ?ED Results / Procedures / Treatments   ?Labs ?(all labs ordered are listed, but only abnormal results are displayed) ?Results for orders placed or performed during the hospital encounter of 06/11/21  ?CBC with Differential  ?Result Value Ref Range  ? WBC 6.1 4.0 - 10.5 K/uL  ? RBC 4.72 4.22 - 5.81 MIL/uL  ? Hemoglobin 12.1 (L) 13.0 - 17.0 g/dL  ? HCT 40.7 39.0 - 52.0 %  ? MCV 86.2 80.0 -  100.0 fL  ? MCH 25.6 (L) 26.0 - 34.0 pg  ? MCHC 29.7 (L) 30.0 - 36.0 g/dL  ? RDW 16.1 (H) 11.5 - 15.5 %  ? Platelets 190 150 - 400 K/uL  ? nRBC 0.0 0.0 - 0.2 %  ? Neutrophils Relative % 58 %  ? Neutro Abs 3.5 1.7 - 7.7 K/uL  ? Lymphocytes Relative 29 %  ? Lymphs Abs 1.8 0.7 - 4.0 K/uL  ? Monocytes Relative 8 %  ? Monocytes Absolute 0.5 0.1 - 1.0 K/uL  ? Eosinophils Relative 4 %  ? Eosinophils Absolute 0.2 0.0 - 0.5 K/uL  ? Basophils Relative 1 %  ? Basophils Absolute 0.1 0.0 - 0.1 K/uL  ? Immature Granulocytes 0 %  ? Abs Immature Granulocytes 0.01 0.00 - 0.07 K/uL  ?Basic metabolic panel  ?Result Value Ref Range  ? Sodium 140 135 - 145 mmol/L  ? Potassium 4.7 3.5 - 5.1 mmol/L  ? Chloride 103 98 - 111 mmol/L  ? CO2 23 22 - 32 mmol/L  ? Glucose, Bld 95 70 - 99 mg/dL  ? BUN 67 (H) 8 - 23 mg/dL  ? Creatinine, Ser 8.73 (H) 0.61 - 1.24 mg/dL  ? Calcium 9.4 8.9 - 10.3 mg/dL  ? GFR, Estimated 6 (L) >60 mL/min  ? Anion gap 14 5 - 15  ?I-stat chem 8, ED (not at [facility] or [facility])  ?Result Value Ref Range  ? Sodium 139 135 - 145 mmol/L  ? Potassium 4.6 3.5 - 5.1 mmol/L  ? Chloride 105 98 - 111 mmol/L  ? BUN 59 (H) 8 - 23 mg/dL  ? Creatinine, Ser 9.20 (H) 0.61 - 1.24 mg/dL  ? Glucose, Bld 90 70 - 99 mg/dL  ? Calcium, Ion 1.08 (L) 1.15 - 1.40 mmol/L  ? TCO2 25 22 - 32 mmol/L  ? Hemoglobin 13.6 13.0 - 17.0 g/dL  ? HCT 40.0 39.0 - 52.0 %  ? ?DG Pelvis 1-2 Views ? ?Result Date: 05/25/2021 ?CLINICAL DATA:  Sacral ulcer. Two views to assess stage III pressure ulcer of sacrum. Patient being seen at [facility]. EXAM: PELVIS - 1-2 VIEW COMPARISON:  None. FINDINGS: There is diffuse decreased bone mineralization. Within this limitation, there appears to be irregularity of the posterior soft tissues along the course of the distal sacrum and coccyx. The decreased bone mineralization limits evaluation for cortical erosion. 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 Dense vascular calcifications are noted. IMPRESSION: Diffuse decreased bone mineralization. This  limits evaluation for cortical erosion in the sacrum and coccyx, however no definitive cortical erosion is identified. Electronically Signed   By: Yvonne Kendall M.D.   On: 05/25/2021 18:12  ? ?IR Fluoro Guide CV Line Right ? ?Result Date: 06/04/2021 ?INDICATION: 71 year old male with a history renal failure referred for new hemodialysis catheter placement EXAM: IMAGE GUIDED PLACEMENT OF RIGHT IJ TUNNELED HEMODIALYSIS CATHETER. REMOVAL OF NONFUNCTIONAL LEFT HEMODIALYSIS CATHETER. MEDICATIONS: 2 g Ancef. The antibiotic was given in an appropriate time interval prior to skin puncture. ANESTHESIA/SEDATION: Moderate (conscious) sedation was not employed during this procedure. A total of Versed 0 mg and Fentanyl 25 mcg was administered intravenously by the radiology nurse. Total intra-service moderate Sedation Time: 0 minutes. The patient's level of consciousness and vital signs were monitored continuously by radiology nursing throughout the procedure under my direct supervision. FLUOROSCOPY: Radiation Exposure Index (as provided by the fluoroscopic device): 1.0 mGy Kerma COMPLICATIONS: None PROCEDURE: Informed written consent was obtained from the patient after a discussion of the risks, benefits, and alternatives to treatment. Questions regarding the procedure were encouraged and answered. The right neck and chest were prepped with Hibiclens in a sterile fashion, and a sterile drape was applied covering the operative field. Maximum barrier sterile technique with sterile gowns and gloves were used for the procedure. A timeout was performed prior to the initiation of the procedure. Ultrasound survey was performed. The right internal jugular vein was confirmed to be patent, with images stored and sent to PACS. Micropuncture kit was utilized to access the right internal  jugular vein under direct, real-time ultrasound guidance after the overlying soft tissues were anesthetized with 1% lidocaine with epinephrine. Stab incision was made with 11 blade scalpel. Microwire was passed ce

## 2021-06-11 NOTE — Progress Notes (Signed)
? ? ?Referring Physician(s): ?Roney Jaffe, MD ? ?Supervising Physician: Sandi Mariscal ? ?Patient Status:  [facility] - In-pt ? ?Chief Complaint: ? ?Bleeding around dialysis catheter ? ?Subjective: ? ?Started bleeding Wednesday per patient.  Endorses site tenderness. ? ?Allergies: ?Povidone iodine ? ?Medications: ?Prior to Admission medications   ?Medication Sig Start Date End Date Taking? Authorizing Provider  ?apixaban (ELIQUIS) 5 MG TABS tablet Take 1 tablet (5 mg total) by mouth 2 (two) times daily. 06/01/21   [name], MD  ?Lorin Picket 1 GM 210 MG(Fe) tablet Take 420 mg by mouth 3 (three) times daily with meals. 04/26/21   [provider]  ?carvedilol (COREG) 12.5 MG tablet Take 1 tablet (12.5 mg total) by mouth 2 (two) times daily. 06/01/21   [name], MD  ?HYDROcodone-acetaminophen (NORCO) 10-325 MG tablet Take 1 tablet by mouth 2 (two) times daily as needed for moderate pain.    [provider]  ?vitamin C (ASCORBIC ACID) 500 MG tablet Take 500 mg by mouth daily.    [provider]  ?zinc sulfate 220 (50 Zn) MG capsule Take 220 mg by mouth daily.    [provider]  ? ? ? ?Vital Signs: ?BP 134/84 (BP Location: Right Arm)   Pulse 65   Temp 98.1 ?F (36.7 ?C) (Oral)   Resp 17   Wt 157 lb 6.5 oz (71.4 kg)   SpO2 96%   BMI 20.77 kg/m?  ? ?Physical Exam ?Vitals reviewed.  ?Constitutional:   ?   General: He is not in acute distress. ?HENT:  ?   Head: Normocephalic and atraumatic.  ?Cardiovascular:  ?   Rate and Rhythm: Normal rate.  ?Pulmonary:  ?   Effort: Pulmonary effort is normal.  ?Chest:  ?   Comments: Right tunneled HD catheter with significant oozing. ?Neurological:  ?   General: No focal deficit present.  ?   Mental Status: He is alert and oriented to person, place, and time.  ? ? ?Imaging: ?DG Chest Portable 1 View ? ?Result Date: 06/11/2021 ?CLINICAL DATA:  Port placement EXAM: PORTABLE CHEST 1 VIEW COMPARISON:  None. FINDINGS: Elevation of the left  hemidiaphragm. There is a right chest wall dual lumen catheter with tip at the cavoatrial junction. No focal airspace consolidation. Left basilar atelectasis. IMPRESSION: Catheter tip at the cavoatrial junction. Electronically Signed   By: Ulyses Jarred M.D.   On: 06/11/2021 02:35   ? ?Labs: ? ?CBC: ?Recent Labs  ?  06/09/21 ?1051 06/10/21 ?0404 06/11/21 ?0229 06/11/21 ?0231 06/11/21 ?0747  ?WBC 5.4 6.8 6.1  --  5.4  ?HGB 13.2 11.9* 12.1* 13.6 11.3*  ?HCT 44.0 40.9 40.7 40.0 36.9*  ?PLT 215 208 190  --  170  ? ? ?COAGS: ?Recent Labs  ?  06/09/21 ?1051 06/11/21 ?0747  ?INR 1.7* 1.5*  ? ? ?BMP: ?Recent Labs  ?  06/09/21 ?1051 06/10/21 ?0404 06/11/21 ?0229 06/11/21 ?0231 06/11/21 ?0747  ?NA 140 142 140 139 140  ?K 5.5* 4.9 4.7 4.6 4.8  ?CL 100 104 103 105 102  ?CO2 26 27 23   --  24  ?GLUCOSE 81 94 95 90 113*  ?BUN 46* 56* 67* 59* 68*  ?CALCIUM 9.6 8.6* 9.4  --  9.1  ?CREATININE 6.59* 7.54* 8.73* 9.20* 8.94*  ?GFRNONAA 8* 7* 6*  --  6*  ? ? ?LIVER FUNCTION TESTS: ?Recent Labs  ?  06/09/21 ?1051 06/11/21 ?0747  ?BILITOT 0.5 0.7  ?AST 20 18  ?ALT 9 8  ?  ALKPHOS 160* 156*  ?PROT 7.2 6.4*  ?ALBUMIN 3.0* 2.7*  ? ? ?Assessment and Plan: ? ?HD catheter bleeding ?--pressure applied, site prepped, and incision site was serially packed with QuickClot and there was progressive reduction in bleeding.  After 40 minutes, site had stopped oozing.  It was dressed with quick clot and Tegaderm. ?--Please reach out to IR if bleeding persists.   ? ?Electronically Signed: ?Pasty Spillers, PA ?06/11/2021, 1:43 PM ? ? ?I spent a total of  45 minutes  at the the patient's bedside AND on the patient's hospital floor or unit, greater than 50% of which was counseling/coordinating care for bleeding HD catheter. ? ? ? ? ? ?

## 2021-06-11 NOTE — Progress Notes (Signed)
Patient received via stretcher alert and orin.[name] to room . Daughter at bedside. R.N. into do assessment.  ?

## 2021-06-11 NOTE — Progress Notes (Signed)
?PROGRESS NOTE ? ? ? ?[name]  PQZ:[id number] DOB: June 26, 1950 DOA: 06/11/2021 ?PCP: Traci Sermon, PA-C ? ? ?Brief Narrative:  ?[name] is a 71 y.o. male with medical history significant for ESRD on hemodialysis MWF, paroxysmal atrial fibrillation chronically anticoagulated on Eliquis, who is admitted to [facility] on 06/11/2021 with bleeding from HD catheter site. ? ?Assessment & Plan: ?  ?Principal Problem: ?  Hemodialysis catheter dysfunction ([place]) ?Active Problems: ?  Paroxysmal atrial fibrillation ([place]) ?  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 ([place]) ? ?Bleeding tunneled RIJ -left ama 3/23 ?-Nephrology and interventional radiology requested to evaluate dialysis catheter for possible further intervention ?-TXA administered topically overnight ?-Does not appear to be overtly bleeding this morning, H&H stable ? ?ESRD MWF ?-Nephrology following, appreciate insight and recommendations ? ?Paroxysmal afib ?-Chronically on Eliquis, likely playing a large role in patient's bleeding access site as above, ? ?DVT prophylaxis: Eliquis ?Code Status: Full ?Family Communication: None present ? ?Status is: Observation ? ?Dispo: The patient is from: Home ?             Anticipated d/c is to: To be determined ?             Anticipated d/c date is: 24 to 48 hours ?             Patient currently not medically stable for discharge ? ?Consultants:  ?IR, nephrology ? ?Procedures:  ?None ? ?Antimicrobials:  ?None ? ?Subjective: ?No acute issues or events overnight ? ?Objective: ?Vitals:  ? 06/11/21 0345 06/11/21 0555 06/11/21 3354 06/11/21 5625  ?BP: 129/87  (!) 140/126 (!) 149/136  ?Pulse: 65  63   ?Resp: 18 20 19    ?Temp: 98.1 ?F (36.7 ?C) 98.1 ?F (36.7 ?C)    ?TempSrc: Temporal Oral    ?SpO2: 100% 96% 96%   ?Weight:  71.4 kg    ? ?No intake or output data in the 24 hours ending 06/11/21 0738 ?Filed Weights  ? 06/11/21 0555  ?Weight: 71.4 kg  ? ? ?Examination: ? ?General exam: Appears calm and comfortable  ?Respiratory system:  Clear to auscultation. Respiratory effort normal. ?Cardiovascular system: S1 & S2 heard, RRR. No JVD, murmurs, rubs, gallops or clicks. No pedal edema. ?Gastrointestinal system: Abdomen is nondistended, soft and nontender. No organomegaly or masses felt. Normal bowel sounds heard. ?Central nervous system: Alert and oriented. No focal neurological deficits. ?Extremities: Symmetric 5 x 5 power. ?Skin: No rashes, lesions or ulcers ?Psychiatry: Judgement and insight appear normal. Mood & affect appropriate.  ? ? ? ?Data Reviewed: I have personally reviewed following labs and imaging studies ? ?CBC: ?Recent Labs  ?Lab 06/09/21 ?1051 06/10/21 ?0404 06/11/21 ?0229 06/11/21 ?0231  ?WBC 5.4 6.8 6.1  --   ?NEUTROABS 3.0  --  3.5  --   ?HGB 13.2 11.9* 12.1* 13.6  ?HCT 44.0 40.9 40.7 40.0  ?MCV 85.4 85.0 86.2  --   ?PLT 215 208 190  --   ? ?Basic Metabolic Panel: ?Recent Labs  ?Lab 06/09/21 ?1051 06/10/21 ?0404 06/11/21 ?0229 06/11/21 ?0231  ?NA 140 142 140 139  ?K 5.5* 4.9 4.7 4.6  ?CL 100 104 103 105  ?CO2 26 27 23   --   ?GLUCOSE 81 94 95 90  ?BUN 46* 56* 67* 59*  ?CREATININE 6.59* 7.54* 8.73* 9.20*  ?CALCIUM 9.6 8.6* 9.4  --   ?MG 2.3  --   --   --   ? ?GFR: ?Estimated Creatinine Clearance: 7.5 mL/min (A) (by C-G formula based  on SCr of 9.2 mg/dL (H)). ?Liver Function Tests: ?Recent Labs  ?Lab 06/09/21 ?1051  ?AST 20  ?ALT 9  ?ALKPHOS 160*  ?BILITOT 0.5  ?PROT 7.2  ?ALBUMIN 3.0*  ? ?No results for input(s): LIPASE, AMYLASE in the last 168 hours. ?No results for input(s): AMMONIA in the last 168 hours. ?Coagulation Profile: ?Recent Labs  ?Lab 06/09/21 ?1051  ?INR 1.7*  ? ?Cardiac Enzymes: ?No results for input(s): CKTOTAL, CKMB, CKMBINDEX, TROPONINI in the last 168 hours. ?BNP (last 3 results) ?No results for input(s): PROBNP in the last 8760 hours. ?HbA1C: ?No results for input(s): HGBA1C in the last 72 hours. ?CBG: ?No results for input(s): GLUCAP in the last 168 hours. ?Lipid Profile: ?No results for input(s): CHOL, HDL,  LDLCALC, TRIG, CHOLHDL, LDLDIRECT in the last 72 hours. ?Thyroid Function Tests: ?Recent Labs  ?  06/10/21 ?0404  ?TSH 0.234*  ? ?Anemia Panel: ?No results for input(s): VITAMINB12, FOLATE, FERRITIN, TIBC, IRON, RETICCTPCT in the last 72 hours. ?Sepsis Labs: ?No results for input(s): PROCALCITON, LATICACIDVEN in the last 168 hours. ? ?No results found for this or any previous visit (from the past 240 hour(s)).  ? ?Radiology Studies: ?DG Chest Portable 1 View ? ?Result Date: 06/11/2021 ?CLINICAL DATA:  Port placement EXAM: PORTABLE CHEST 1 VIEW COMPARISON:  None. FINDINGS: Elevation of the left hemidiaphragm. There is a right chest wall dual lumen catheter with tip at the cavoatrial junction. No focal airspace consolidation. Left basilar atelectasis. IMPRESSION: Catheter tip at the cavoatrial junction. Electronically Signed   By: Ulyses Jarred M.D.   On: 06/11/2021 02:35   ? ?Scheduled Meds: ? apixaban  5 mg Oral BID  ? ferric citrate  420 mg Oral TID WC  ? oxyCODONE  5 mg Oral Once  ? tranexamic acid  500 mg Topical Once  ? ?Continuous Infusions: ? ? LOS: 0 days  ? ?Time spent: 24min ? ?Little Ishikawa, DO ?[facility] ? ?If 7PM-7AM, please contact night-coverage ?[email or web address] ? ?06/11/2021, 7:38 AM   ? ? ? ?

## 2021-06-11 NOTE — Assessment & Plan Note (Signed)
#)   ESRD: on HD (schedule: Monday, Wednesday, Friday).  Of note, most recent hemodialysis session occurred on 06/07/2021.  Consequently, patient will be due for routine hemodialysis on the morning of 06/11/2021. No clinical evidence for urgent overnight HD or to expedite HD relative to this timeframe, including no evidence of hyperkalemia, no evidence of anion gap metabolic acidosis, uremia, or acute volume overload, including presenting chest x-ray showing no evidence of acute cardiopulmonary process, including no evidence of edema. On [place] as outpatient. ?  ?  ?Plan: monitor strict I's/O's, daily weights. CMP in the AM. Check mag and phos levels.  Anticipate routine nephrology consultation in the morning to assist with scheduling of routine hemodialysis, as further detailed above.  Continue outpatient auryxia.  ?  ?  ?  ?

## 2021-06-11 NOTE — ED Triage Notes (Signed)
Pt c/o bleeding from port site at upper right chest, pt on eliquis. Placed 3/17. Seen Wednesday for same, left AMA. Pt denies lightheadedness, dizziness, SHOB, CP, associated symptoms. ? ?VSS, NAD on arrival ?

## 2021-06-11 NOTE — Assessment & Plan Note (Signed)
#)   Bleeding from tunneled right IJ hemodialysis catheter site: Recent hospitalization for such, before the patient left AMA on 06/10/2021, before presenting back to the emergency department this evening complaining of some residual bleeding from the tunnel right IJ hemodialysis catheter site.  Status post clot evacuation via EDP this evening, with interval improvement following application of pressure dressing, with mild residual bleeding at the site after taking down pressure dressing at request of patient's daughter.  Subsequently, topical TXA has been applied following which pressure dressing reapplied, without any evidence of bleeding through this dressing.  This relates to the recently placed tunneled right IJ HD catheter by interventional radiology on 06/04/2021, as further detailed above.  As the patient is now presented twice over the last 48 hours for bleeding from his new tunneled right IJ hemodialysis catheter site, will admit for overnight observation to monitor for associated hemostasis.  Of note, he appears hemodynamically stable, with stable hemoglobin, with presenting value of 12.1, up slightly from most recent prior value of 11.9 on the morning of 06/10/2021.  Patient is otherwise asymptomatic at this time.  Of note, the patient is chronically anticoagulated on Eliquis in the setting of paroxysmal atrial fibrillation. ?  ?Plan: Status post topical TXA following which pressure dressing reapplied.  Repeat CBC in the morning, at which time we will repeat INR.  Monitor on telemetry.  Anticipate ensuing consultation of nephrology, including for assistance with routine hemodialysis, as further detailed below. ?  ?  ?

## 2021-06-11 NOTE — Progress Notes (Signed)
Text page Dr. Aileen Fass patient wanting Hydrocodone 5/325 one p.o. bid and B.P is high 140/126 see flow sheets for V.S. ?

## 2021-06-11 NOTE — Procedures (Signed)
? ?  I was present at this dialysis session, have reviewed the session itself and made  appropriate changes ?[name] MD ?[employer] ?pager [phone]   ?06/11/2021, 4:03 PM ? ? ?

## 2021-06-11 NOTE — H&P (Signed)
?History and Physical  ? ? ?PLEASE NOTE THAT [employer] SOFTWARE WAS USED IN THE CONSTRUCTION OF THIS NOTE. ? ? ?[name] [id number]:[id number] DOB: February 19, 1951 DOA: 06/11/2021 ? ?PCP: Leonard Sermon, PA-C  ?Patient coming from: home  ? ?I have personally briefly reviewed patient's old medical records in [place] ? ?Chief Complaint: Bleeding from hemodialysis catheter site ? ?HPI: [name] is a 71 y.o. male with medical history significant for end-stage renal disease on hemodialysis, Monday, Wednesday, Friday schedule, paroxysmal atrial fibrillation chronically anticoagulated on Eliquis, who is admitted to [facility] on 06/11/2021 with bleeding from HD catheter site after presenting from home to [facility] ED complaining of such.  ? ?The following history is provided via my discussions with the patient as well as my discussions with the patient's daughter, is present at bedside, in addition to my discussions with the EDP and via chart review. ? ?Per daughter, the patient has history of end-stage renal disease, requiring hemodialysis over the course of the last 15 years.  His left tunneled HD catheter recently became nonfunctional, prompting, on 06/04/2021, removal of his nonfunctional left tunneled HD catheter, followed by IR placement of tunneled HD catheter in the right IJ at that time.  Subsequent to this, the patient was admitted to [facility] on 06/09/21 For bleeding at the site of this new tunneled right IJ HD catheter.  It appears that hemostasis was achieved, but, per discharge summary, the patient left the hospital AMA on 06/10/2021 before being able to get his recommended routine hemodialysis.  Consequently, most recent hemodialysis in the setting of a Monday, Wednesday, Friday schedule occurred on Monday, 06/07/2021. ? ?After leaving the hospital AMA on 06/10/2021, the patient presents back to [facility] emergency department this evening complaining of some additional bleeding from his tunneled right IJ  HD catheter site.  In the setting of a history of paroxysmal atrial fibrillation, he is chronically anticoagulated on Eliquis, with most recent such dose occurring on the evening of 06/10/2021.  He is not on any dedicated antiplatelet medications as an outpatient.  Denies bleeding from any other site, including denial of any recent melena or hematochezia.  Denies any recent chest pain, shortness of breath, palpitations, diaphoresis, dizziness, presyncope, or syncope.  No recent nausea or vomiting.  Denies any recent orthopnea, PND, or worsening of peripheral edema. ? ? ? ?ED Course:  ? ?EDP was able to evacuate a large clot associated with the patient's tunneled right IJ HD catheter followed by placement of pressure dressing.  Patient's daughter reportedly requested that the pressure dressing be taken down, at which time there was reportedly a small amount of oozing blood at the HD catheter site, representing an interval decline in such following the above evacuation as well as interval pressure dressing use.  ? ?Vital signs in the ED were notable for the following: Afebrile; heart rate 65, blood pressure 128/87; respiratory rate 18, oxygen saturation 97% on room air. ? ?Labs were notable for the following: BMP notable for the following: Sodium 140, potassium 4.7, bicarbonate 23, anion gap 14, glucose 95.  CBC notable for hemoglobin 12.1 compared to most recent prior value of 11.9 on the morning of 06/10/2021, platelet count 190. ? ?Imaging and additional notable ED work-up: EKG showed sinus rhythm with heart rate 66, prolonged PR interval of 237, left bundle branch block, carriages 128, QTc 446; no evidence of T wave or ST changes, including no evidence of ST elevation.  Chest x-ray showed evidence of  a dual-lumen catheter, with tip at the cavoatrial junction, will demonstrate no evidence of infiltrate, edema, pleural effusion, or pneumothorax. ? ?While in the ED, the following were administered: Pressure dressing  was reapplied following application of topical TXA. ? ?Subsequently, the patient was admitted overnight observation for further evaluation management of presenting bleeding from tunneled right IJ hemodialysis catheter site. ? ? ? ? ?Review of Systems: As per HPI otherwise 10 point review of systems negative.  ? ?Past Medical History:  ?Diagnosis Date  ? A-fib ([place])   ? Edema   ? ESRD (end stage renal disease) ([place])   ? History of insertion of tunneled central venous catheter (CVC) with port   ? Hypotension   ? ? ?Past Surgical History:  ?Procedure Laterality Date  ? IR FLUORO GUIDE CV LINE RIGHT  06/04/2021  ? IR REMOVAL TUN CV CATH W/O FL  06/04/2021  ? IR US GUIDE VASC ACCESS RIGHT  06/04/2021  ? ? ?Social History: ? reports that he has quit smoking. His smoking use included cigarettes. He does not have any smokeless tobacco history on file. He reports that he does not currently use alcohol. He reports that he does not currently use drugs. ? ? ?Allergies  ?Allergen Reactions  ? Povidone Iodine Rash  ?  Betadine   ? ? ?Family History  ?Problem Relation Age of Onset  ? Hypertension Mother   ? Diabetes Mother   ? Heart attack Father   ? ? ?Family history reviewed and not pertinent  ? ? ?Prior to Admission medications   ?Medication Sig Start Date End Date Taking? Authorizing Provider  ?apixaban (ELIQUIS) 5 MG TABS tablet Take 1 tablet (5 mg total) by mouth 2 (two) times daily. 06/01/21   [name], MD  ?Lorin Picket 1 GM 210 MG(Fe) tablet Take 420 mg by mouth 3 (three) times daily with meals. 04/26/21   [provider]  ?carvedilol (COREG) 12.5 MG tablet Take 1 tablet (12.5 mg total) by mouth 2 (two) times daily. 06/01/21   [name], MD  ?HYDROcodone-acetaminophen (NORCO) 10-325 MG tablet Take 1 tablet by mouth 2 (two) times daily as needed for moderate pain.    [provider]  ?vitamin C (ASCORBIC ACID) 500 MG tablet Take 500 mg by mouth daily.    [provider]  ?zinc sulfate 220 (50  Zn) MG capsule Take 220 mg by mouth daily.    [provider]  ? ? ? ?Objective  ? ? ?Physical Exam: ?Vitals:  ? 06/11/21 0138  ?BP: 128/87  ?Pulse: 65  ?Resp: 18  ?Temp: 98.2 ?F (36.8 ?C)  ?TempSrc: Temporal  ?SpO2: 97%  ? ? ?General: appears to be stated age; alert, oriented ?Skin: warm, dry, no rash ?Head:  AT/[place] ?Mouth:  Oral mucosa membranes appear moist, normal dentition ?Neck: supple; trachea midline ?Chest: right IJ tunneled HD cath with pressure dressing and no evidence of bleeding around or through this pressure dressing. ?Heart:  RRR; did not appreciate any M/R/G ?Lungs: CTAB, did not appreciate any wheezes, rales, or rhonchi ?Abdomen: + BS; soft, ND, NT ?Vascular: 2+ pedal pulses b/l; 2+ radial pulses b/l ?Extremities: no peripheral edema, no muscle wasting ?Neuro: strength and sensation intact in upper and lower extremities b/l ? ? ?Labs on Admission: I have personally reviewed following labs and imaging studies ? ?CBC: ?Recent Labs  ?Lab 06/09/21 ?1051 06/10/21 ?0404 06/11/21 ?0229 06/11/21 ?0231  ?WBC 5.4 6.8 6.1  --   ?NEUTROABS 3.0  --  3.5  --   ?HGB 13.2 11.9* 12.1* 13.6  ?HCT 44.0 40.9 40.7 40.0  ?MCV 85.4 85.0 86.2  --   ?PLT 215 208 190  --   ? ?Basic Metabolic Panel: ?Recent Labs  ?Lab 06/09/21 ?1051 06/10/21 ?0404 06/11/21 ?0229 06/11/21 ?0231  ?NA 140 142 140 139  ?K 5.5* 4.9 4.7 4.6  ?CL 100 104 103 105  ?CO2 26 27 23   --   ?GLUCOSE 81 94 95 90  ?BUN 46* 56* 67* 59*  ?CREATININE 6.59* 7.54* 8.73* 9.20*  ?CALCIUM 9.6 8.6* 9.4  --   ?MG 2.3  --   --   --   ? ?GFR: ?Estimated Creatinine Clearance: 7.3 mL/min (A) (by C-G formula based on SCr of 9.2 mg/dL (H)). ?Liver Function Tests: ?Recent Labs  ?Lab 06/09/21 ?1051  ?AST 20  ?ALT 9  ?ALKPHOS 160*  ?BILITOT 0.5  ?PROT 7.2  ?ALBUMIN 3.0*  ? ?No results for input(s): LIPASE, AMYLASE in the last 168 hours. ?No results for input(s): AMMONIA in the last 168 hours. ?Coagulation Profile: ?Recent Labs  ?Lab 06/09/21 ?1051  ?INR 1.7*   ? ?Cardiac Enzymes: ?No results for input(s): CKTOTAL, CKMB, CKMBINDEX, TROPONINI in the last 168 hours. ?BNP (last 3 results) ?No results for input(s): PROBNP in the last 8760 hours. ?HbA1C: ?No results for in

## 2021-06-11 NOTE — Consult Note (Addendum)
Renal Service ?Consult Note ?[facility] ? ?[name] ?06/11/2021 ?Leonard Blazing, MD ?Requesting Physician: Dr. [name] ? ?Reason for Consult: ESRD pt w/ missed HD and bleeding from recently placed [facility] ?HPI: The patient is a 71 y.o. year-old w/ hx of atrial fib, ESRD on HD who was admitted for bleeding from his new HD catheter exit site. Pt's prior L chest TDC was malfunctioning (in for 6-8 mos) and Dr [name], his nephrologist, asked IR at [facility] to place a new [place] in the R side and this was done here on 06/04/21. The pt then presented w/ bleeding on 3/22 of less than < 24 hrs. He was admitted but left AMA and missed his Wednesday HD this week. He also had some bradycardia while here. Pt returned early am today w/ further bleeding from the [facility] exit site. Pt was admitted. We are asked to see for ESRD.  ? ?Pt seen in room. Pt moved here last year from [place], he had a L IJ TDC in place which was put in around summer 2022 after his LUA graft stopped working. Recently on 06/01/21 the patient was seen by cardiology in clinic to establish care. His amio was stopped, coreg adjusted and he was started on Eliquis 5 mg twice daily. The patient states that he has had afib for "70 yrs" and has never been on a/c's.   ? ?Pt has not HD c/o's. Did miss Wed HD as above.   ? ?ROS - denies CP, no joint pain, no HA, no blurry vision, no rash, no diarrhea, no nausea/ vomiting, no dysuria, no difficulty voiding ? ? ?Past Medical History  ?Past Medical History:  ?Diagnosis Date  ? A-fib ([place])   ? Edema   ? ESRD (end stage renal disease) ([place])   ? History of insertion of tunneled central venous catheter (CVC) with port   ? Hypotension   ? ?Past Surgical History  ?Past Surgical History:  ?Procedure Laterality Date  ? IR FLUORO GUIDE CV LINE RIGHT  06/04/2021  ? IR REMOVAL TUN CV CATH W/O FL  06/04/2021  ? IR US GUIDE VASC ACCESS RIGHT  06/04/2021  ? ?Family History  ?Family History  ?Problem Relation Age of Onset  ?  Hypertension Mother   ? Diabetes Mother   ? Heart attack Father   ? ?Social History  reports that he has quit smoking. His smoking use included cigarettes. He does not have any smokeless tobacco history on file. He reports that he does not currently use alcohol. He reports that he does not currently use drugs. ?Allergies  ?Allergies  ?Allergen Reactions  ? Povidone Iodine Rash  ?  Betadine   ? ?Home medications ?Prior to Admission medications   ?Medication Sig Start Date End Date Taking? Authorizing Provider  ?apixaban (ELIQUIS) 5 MG TABS tablet Take 1 tablet (5 mg total) by mouth 2 (two) times daily. 06/01/21   [name], MD  ?Lorin Picket 1 GM 210 MG(Fe) tablet Take 420 mg by mouth 3 (three) times daily with meals. 04/26/21   [provider]  ?carvedilol (COREG) 12.5 MG tablet Take 1 tablet (12.5 mg total) by mouth 2 (two) times daily. 06/01/21   [name], MD  ?HYDROcodone-acetaminophen (NORCO) 10-325 MG tablet Take 1 tablet by mouth 2 (two) times daily as needed for moderate pain.    [provider]  ?vitamin C (ASCORBIC ACID) 500 MG tablet Take 500 mg by mouth daily.    [provider]  ?  zinc sulfate 220 (50 Zn) MG capsule Take 220 mg by mouth daily.    [provider]  ? ? ? ?Vitals:  ? 06/11/21 0607 06/11/21 4037 06/11/21 0723 06/11/21 1249  ?BP: (!) 140/126 (!) 149/136 (!) 144/84 134/84  ?Pulse: 63  67 65  ?Resp: 19  19 17   ?Temp:      ?TempSrc:    Oral  ?SpO2: 96%  99% 96%  ?Weight:      ? ?Exam ?Gen alert, no distress ?No rash, cyanosis or gangrene ?Sclera anicteric, throat clear  ?No jvd or bruits ?Chest clear bilat to bases, no rales/ wheezing ?RRR no RG ?Abd soft ntnd no mass or ascites +bs ?GU normal male ?MS no joint effusions or deformity ?Ext no LE or UE edema, no wounds or ulcers ?Neuro is alert, Ox 3 , nf ?   RIJ TDC w/ bloody dressing ? ? ? ?  Home meds : eliquis, auryxia 2 ac tid, coreg 12.5 bid, norco prn, vits ? ? ? OP HD: MWF GOC ? 4h  400/800   65kg   2/2.5  RIJ TDC   Hep 2500 ? - mircera 150 q2wk last 3/1, due 3/1 ? - calcitriol 1.25 ug tiw ? ? ?Assessment/ Plan: ?Bleeding from R [facility] exit site - in setting of new placement 1 wk ago and new eliquis started within the last week (for afib).  Plan dc eliquis for now until bleeding has completely abated. IR consulted to assistance w/ exit site bleed.  ?ESRD - on HD MWF. Missed HD Wed. HD today or tonight.  ?HTN/ volume - up 6kg by wts. UF 4 L as tolerated.  ?Atrial fib - chronic issues, just started on eliquis after getting new cardiologist here in [place] (moved from [place] in Nov 2022 to [facility]).  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  ? ? ? ?[name]  MD ?06/11/2021, 2:18 PM ? ?Recent Labs  ?Lab 06/09/21 ?1051 06/10/21 ?0404 06/11/21 ?0229 06/11/21 ?0231 06/11/21 ?0964  ?HGB 13.2   < > 12.1* 13.6 11.3*  ?ALBUMIN 3.0*  --   --   --  2.7*  ?CALCIUM 9.6   < > 9.4  --  9.1  ?PHOS  --   --   --   --  5.7*  ?CREATININE 6.59*   < > 8.73* 9.20* 8.94*  ?K 5.5*   < > 4.7 4.6 4.8  ? < > = values in this interval not displayed.  ? ? ?

## 2021-06-11 NOTE — Assessment & Plan Note (Signed)
#)   Paroxysmal atrial fibrillation: Documented history of such. In setting of CHA2DS2-VASc score of  3, there is an indication for chronic anticoagulation for thromboembolic prophylaxis. Consistent with this, patient is chronically anticoagulated on Eliquis. Home AV nodal blocking regimen: Coreg.   Presenting EKG reflects sinus rhythm with heart rate 66 without evidence of acute ischemic changes, while also being notable for evidence of prolonged PR, with associated interval noted to be 237 ms. Consequently will hold Coreg for now, closely monitor ensuing heart rate on telemetry, with plan to repeat ekg in the AM to further eval interval trend in PR interval. ?  ?  ?Plan: monitor strict I's & O's and daily weights. Repeat BMP/CBC in AM. Check serum mag level.  Holding home Coreg for now, as above.  Monitor on telemetry.  Continue home Eliquis.  Repeat EKG in the morning, as above.   ?

## 2021-06-12 DIAGNOSIS — Z992 Dependence on renal dialysis: Secondary | ICD-10-CM | POA: Diagnosis not present

## 2021-06-12 DIAGNOSIS — N186 End stage renal disease: Secondary | ICD-10-CM | POA: Diagnosis not present

## 2021-06-12 DIAGNOSIS — T8241XD Breakdown (mechanical) of vascular dialysis catheter, subsequent encounter: Secondary | ICD-10-CM | POA: Diagnosis not present

## 2021-06-12 DIAGNOSIS — I480 Paroxysmal atrial fibrillation: Secondary | ICD-10-CM | POA: Diagnosis not present

## 2021-06-12 LAB — CBC
HCT: 39.5 % (ref 39.0–52.0)
Hemoglobin: 12 g/dL — ABNORMAL LOW (ref 13.0–17.0)
MCH: 25.6 pg — ABNORMAL LOW (ref 26.0–34.0)
MCHC: 30.4 g/dL (ref 30.0–36.0)
MCV: 84.4 fL (ref 80.0–100.0)
Platelets: 173 10*3/uL (ref 150–400)
RBC: 4.68 MIL/uL (ref 4.22–5.81)
RDW: 15.8 % — ABNORMAL HIGH (ref 11.5–15.5)
WBC: 6.9 10*3/uL (ref 4.0–10.5)
nRBC: 0 % (ref 0.0–0.2)

## 2021-06-12 LAB — BASIC METABOLIC PANEL
Anion gap: 9 (ref 5–15)
BUN: 30 mg/dL — ABNORMAL HIGH (ref 8–23)
CO2: 28 mmol/L (ref 22–32)
Calcium: 9.1 mg/dL (ref 8.9–10.3)
Chloride: 100 mmol/L (ref 98–111)
Creatinine, Ser: 5.38 mg/dL — ABNORMAL HIGH (ref 0.61–1.24)
GFR, Estimated: 11 mL/min — ABNORMAL LOW (ref >60)
Glucose, Bld: 98 mg/dL (ref 70–99)
Potassium: 4.3 mmol/L (ref 3.5–5.1)
Sodium: 137 mmol/L (ref 135–145)

## 2021-06-12 LAB — HEPATITIS B SURFACE ANTIBODY, QUANTITATIVE: Hep B S AB Quant (Post): 371.9 m[IU]/mL (ref >9.9)

## 2021-06-12 MED ORDER — CARVEDILOL 6.25 MG PO TABS: 6.2500 mg | ORAL_TABLET | Freq: Two times a day (BID) | ORAL | 0 refills | Status: AC

## 2021-06-12 MED ORDER — CALCITRIOL 0.25 MCG PO CAPS: 1.2500 ug | ORAL_CAPSULE | ORAL | 0 refills | Status: AC

## 2021-06-12 NOTE — Discharge Summary (Signed)
Physician Discharge Summary  ?[name] YCX:[id number] DOB: [birth year] DOA: 06/11/2021 ? ?PCP: Traci Sermon, PA-C ? ?Admit date: 06/11/2021 ?Discharge date: 06/12/2021 ? ?Admitted From: Home ?Disposition: Home ? ?Recommendations for Outpatient Follow-up:  ?Follow up with PCP in 1-2 weeks ?Please obtain BMP/CBC in one week ?Please follow up with dialysis as scheduled: ? ?Home Health: None ?Equipment/Devices: None ? ?Discharge Condition: Stable ?CODE STATUS: Full ?Diet recommendation: Renal diet ? ?Brief/Interim Summary: ?[name] is a 71 y.o. male with medical history significant for ESRD on hemodialysis MWF, paroxysmal atrial fibrillation chronically anticoagulated on Eliquis, who is admitted to [facility] on 06/11/2021 with bleeding from HD catheter site. ? ?Patient evaluated by interventional radiology as well as nephrology, status post topical transischemic acid over right tunneled IJ catheter with resolution of bleeding, hemoglobin stable if not uptrending appropriately.  Patient otherwise stable and agreeable for discharge home, will resume Eliquis in the interim, if recurrent episodes of bleeding would need to have further discussion about risks versus benefits of chronic anticoagulation with nephrology and PCP. ? ?Discharge Diagnoses:  ?Principal Problem: ?  Hemodialysis catheter dysfunction ([place]) ?Active Problems: ?  Paroxysmal atrial fibrillation ([place]) ?  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 ([place]) ? ? ? ?Discharge Instructions ? ?Discharge Instructions   ? ? Discharge patient   Complete by: As directed ?  ? Discharge disposition: 01-Home or Self Care  ? Discharge patient date: 06/12/2021  ? ?  ? ?Allergies as of 06/12/2021   ? ?   Reactions  ? Povidone Iodine Rash  ? Betadine   ? ?  ? ?  ?Medication List  ?  ? ?TAKE these medications   ? ?apixaban 5 MG Tabs tablet ?Commonly known as: ELIQUIS ?Take 1 tablet (5 mg total) by mouth 2 (two) times daily. ?  ?Auryxia 1 GM 210 MG(Fe) tablet ?Generic drug: ferric  citrate ?Take 420 mg by mouth 3 (three) times daily with meals. ?  ?calcitRIOL 0.25 MCG capsule ?Commonly known as: ROCALTROL ?Take 5 capsules (1.25 mcg total) by mouth every Monday, Wednesday, and Friday with hemodialysis. ?Start taking on: June 14, 2021 ?  ?carvedilol 6.25 MG tablet ?Commonly known as: COREG ?Take 1 tablet (6.25 mg total) by mouth 2 (two) times daily. ?What changed:  ?medication strength ?how much to take ?  ?HYDROcodone-acetaminophen 10-325 MG tablet ?Commonly known as: NORCO ?Take 1 tablet by mouth 2 (two) times daily as needed for moderate pain. ?  ?vitamin C 500 MG tablet ?Commonly known as: ASCORBIC ACID ?Take 500 mg by mouth daily. ?  ?zinc sulfate 220 (50 Zn) MG capsule ?Take 220 mg by mouth daily. ?  ? ?  ? ? Follow-up Information   ? ? [name], Vista Mink, PA-C.   ?Specialty: Physician Assistant ?Contact information: ?[address] ?[place] [address] ?[phone] ? ? ?  ?  ? ?  ?  ? ?  ? ?Allergies  ?Allergen Reactions  ? Povidone Iodine Rash  ?  Betadine   ? ? ?Consultations: ?IR, nephrology ? ?Procedures/Studies: ?DG Pelvis 1-2 Views ? ?Result Date: 05/25/2021 ?CLINICAL DATA:  Sacral ulcer. Two views to assess stage III pressure ulcer of sacrum. Patient being seen at [place]. EXAM: PELVIS - 1-2 VIEW COMPARISON:  None. FINDINGS: There is diffuse decreased bone mineralization. Within this limitation, there appears to be irregularity of the posterior soft tissues along the course of the distal sacrum and coccyx. The decreased bone mineralization limits evaluation for cortical erosion. Markedly severe bilateral femoroacetabular osteoarthritis  with bone-on-bone contact and moderate to high-grade bilateral femoral head and adjacent acetabular cortical erosions. Dense vascular calcifications are noted. IMPRESSION: Diffuse decreased bone mineralization. This limits evaluation for cortical erosion in the sacrum and coccyx, however no definitive cortical erosion is identified.  Electronically Signed   By: Yvonne Kendall M.D.   On: 05/25/2021 18:12  ? ?IR Fluoro Guide CV Line Right ? ?Result Date: 06/04/2021 ?INDICATION: 71 year old male with a history renal failure referred for new hemodialysis catheter placement EXAM: IMAGE GUIDED PLACEMENT OF RIGHT IJ TUNNELED HEMODIALYSIS CATHETER. REMOVAL OF NONFUNCTIONAL LEFT HEMODIALYSIS CATHETER. MEDICATIONS: 2 g Ancef. The antibiotic was given in an appropriate time interval prior to skin puncture. ANESTHESIA/SEDATION: Moderate (conscious) sedation was not employed during this procedure. A total of Versed 0 mg and Fentanyl 25 mcg was administered intravenously by the radiology nurse. Total intra-service moderate Sedation Time: 0 minutes. The patient's level of consciousness and vital signs were monitored continuously by radiology nursing throughout the procedure under my direct supervision. FLUOROSCOPY: Radiation Exposure Index (as provided by the fluoroscopic device): 1.0 mGy Kerma COMPLICATIONS: None PROCEDURE: Informed written consent was obtained from the patient after a discussion of the risks, benefits, and alternatives to treatment. Questions regarding the procedure were encouraged and answered. The right neck and chest were prepped with Hibiclens in a sterile fashion, and a sterile drape was applied covering the operative field. Maximum barrier sterile technique with sterile gowns and gloves were used for the procedure. A timeout was performed prior to the initiation of the procedure. Ultrasound survey was performed. The right internal jugular vein was confirmed to be patent, with images stored and sent to PACS. Micropuncture kit was utilized to access the right internal jugular vein under direct, real-time ultrasound guidance after the overlying soft tissues were anesthetized with 1% lidocaine with epinephrine. Stab incision was made with 11 blade scalpel. Microwire was passed centrally. The microwire was then marked to measure appropriate  internal catheter length. External tunneled length was estimated. A total tip to cuff length of 19 cm was selected. 035 guidewire was advanced to the level of the IVC. Skin and subcutaneous tissues of chest wall below the clavicle were generously infiltrated with 1% lidocaine for local anesthesia. A small stab incision was made with 11 blade scalpel. The selected hemodialysis catheter was tunneled in a retrograde fashion from the anterior chest wall to the venotomy incision. Serial dilation was performed and then a peel-away sheath was placed. The catheter was then placed through the peel-away sheath with tips ultimately positioned within the superior aspect of the right atrium. Final catheter positioning was confirmed and documented with a spot radiographic image. The catheter aspirates and flushes normally. The catheter was flushed with appropriate volume heparin dwells. The catheter exit site was secured with a 0-Prolene retention suture. Gel-Foam slurry was infused into the soft tissue tract. The venotomy incision was closed Derma bond and sterile dressing. Dressings were applied at the chest wall. Patient tolerated the procedure well and remained hemodynamically stable throughout. After placement of the tunneled hemodialysis catheter, the left-sided nonfunctional catheter was removed. The left neck and chest, including the indwelling catheter were prepped with Hibiclens in a sterile fashion, and a sterile drape was applied covering the operative field. Maximum barrier sterile technique with sterile gowns and gloves were used for the procedure. The remote retention sutures were removed. Generous 1% lidocaine was administered for local anesthesia. Blunt dissection was used to free the catheter from the tract and then manual traction was  used to remove the catheter. Manual pressure was used for hemostasis. No complications were encountered and no significant blood loss encountered. Sterile bandage were placed.  After the right IJ catheter was placed and the left was removed, we then removed some remote stay sutures from the patient's left arm access site. IMPRESSION: Status post image guided placement of right IJ tunnel

## 2021-06-13 ENCOUNTER — Telehealth (HOSPITAL_COMMUNITY): Payer: Self-pay | Admitting: Nephrology

## 2021-06-13 NOTE — Telephone Encounter (Signed)
Transition of care contact from inpatient facility ? ?Date of Discharge: 06/12/21 ?Date of Contact: 06/13/21 - attempted ?Method of contact: Phone call to daughter's cell ? ?Attempted to contact patient to discuss transition of care from inpatient admission. Patient did not answer the phone. Voicemail picked up but no ability to leave a message as the voicemail box was full. ? ?Will attempted to see him during outpatient HD this week. ? ?Veneta Penton, PA-C ?[facility] ?Pager [phone] ? ?

## 2021-06-15 ENCOUNTER — Ambulatory Visit (HOSPITAL_COMMUNITY): Payer: Medicare (Managed Care) | Attending: Internal Medicine

## 2021-06-15 ENCOUNTER — Other Ambulatory Visit: Payer: Self-pay

## 2021-06-15 DIAGNOSIS — I4891 Unspecified atrial fibrillation: Secondary | ICD-10-CM

## 2021-06-15 LAB — ECHOCARDIOGRAM COMPLETE
Area-P 1/2: 2.58 cm2
MV VTI: 1.25 cm2
S' Lateral: 1.7 cm

## 2021-06-17 ENCOUNTER — Encounter (HOSPITAL_BASED_OUTPATIENT_CLINIC_OR_DEPARTMENT_OTHER): Payer: Medicare (Managed Care) | Admitting: Internal Medicine

## 2021-06-24 ENCOUNTER — Other Ambulatory Visit: Payer: Self-pay

## 2021-06-24 ENCOUNTER — Encounter: Payer: Self-pay | Admitting: Vascular Surgery

## 2021-06-24 ENCOUNTER — Ambulatory Visit (INDEPENDENT_AMBULATORY_CARE_PROVIDER_SITE_OTHER): Payer: Medicare (Managed Care) | Admitting: Vascular Surgery

## 2021-06-24 VITALS — BP 101/74 | HR 71 | Temp 98.8°F | Resp 20 | Ht 73.0 in | Wt 156.0 lb

## 2021-06-24 DIAGNOSIS — N186 End stage renal disease: Secondary | ICD-10-CM | POA: Diagnosis not present

## 2021-06-24 DIAGNOSIS — Z992 Dependence on renal dialysis: Secondary | ICD-10-CM | POA: Diagnosis not present

## 2021-06-24 NOTE — H&P (View-Only) (Signed)
? ?ASSESSMENT & PLAN  ? ?END-STAGE RENAL DISEASE: This patient is referred for new hemodialysis access.  He has a functioning right IJ tunneled dialysis catheter which was just moved from the left 2 weeks ago.  He has an occluded left upper arm graft that was placed in [place].  On exam he has a reasonable cephalic vein in the forearm and I think he is a candidate for a left radiocephalic fistula.  His vein map suggest that the vein is adequate.  On the right side he has a high bifurcation so I would recommend continuing to attempt access of the right arm.  The alternative on the right would be a basilic vein transposition which would likely be done in 2 stages.  We have discussed the indications for the procedure and the potential complications and he is agreeable to proceed.  He dialyzes on Monday Wednesdays and Fridays and this has been scheduled for 06/29/2021.  He had been on Eliquis but this has been discontinued as apparently had been having some bleeding problems. ? ?REASON FOR CONSULT:   ? ?To evaluate for hemodialysis access.  The consult is requested by Dr. [name]. ? ?HPI:  ? ?[name] is a 71 y.o. male who tells me that he has been on dialysis for 15 years.  He had a upper arm graft placed on the left arm in [place].  It looks like he had access in the right arm also in the forearm and upper arm.  He had a left IJ catheter that he was using but this was not functioning so 2 weeks ago the catheter was moved to the right side.  He dialyzes on Monday Wednesdays and Fridays.  He had been on Eliquis for atrial fibrillation but had some bleeding issues and was told to discontinue this.  He denies any recent uremic symptoms. ? ?Past Medical History:  ?Diagnosis Date  ? A-fib ([place])   ? Edema   ? ESRD (end stage renal disease) ([place])   ? History of insertion of tunneled central venous catheter (CVC) with port   ? Hypotension   ? ? ?Family History  ?Problem Relation Age of Onset  ? Hypertension Mother   ?  Diabetes Mother   ? Heart attack Father   ? ? ?SOCIAL HISTORY: ?Social History  ? ?Tobacco Use  ? Smoking status: Former  ?  Types: Cigarettes  ? Smokeless tobacco: Not on file  ?Substance Use Topics  ? Alcohol use: Not Currently  ? ? ?Allergies  ?Allergen Reactions  ? Povidone Iodine Rash  ?  Betadine   ? ? ?Current Outpatient Medications  ?Medication Sig Dispense Refill  ? AURYXIA 1 GM 210 MG(Fe) tablet Take 420 mg by mouth 3 (three) times daily with meals.    ? calcitRIOL (ROCALTROL) 0.25 MCG capsule Take 5 capsules (1.25 mcg total) by mouth every Monday, Wednesday, and Friday with hemodialysis. 12 capsule 0  ? carvedilol (COREG) 6.25 MG tablet Take 1 tablet (6.25 mg total) by mouth 2 (two) times daily. 30 tablet 0  ? HYDROcodone-acetaminophen (NORCO) 10-325 MG tablet Take 1 tablet by mouth 2 (two) times daily as needed for moderate pain.    ? vitamin C (ASCORBIC ACID) 500 MG tablet Take 500 mg by mouth daily.    ? zinc sulfate 220 (50 Zn) MG capsule Take 220 mg by mouth daily.    ? apixaban (ELIQUIS) 5 MG TABS tablet Take 1 tablet (5 mg total) by mouth 2 (two) times daily. (  Patient not taking: Reported on 06/24/2021) 60 tablet 11  ? ?No current facility-administered medications for this visit.  ? ? ?REVIEW OF SYSTEMS:  ?[X]  denotes positive finding, [ ]  denotes negative finding ?Cardiac  Comments:  ?Chest pain or chest pressure:    ?Shortness of breath upon exertion: x   ?Short of breath when lying flat:    ?Irregular heart rhythm:    ?    ?Vascular    ?Pain in calf, thigh, or hip brought on by ambulation:    ?Pain in feet at night that wakes you up from your sleep:  x   ?Blood clot in your veins:    ?Leg swelling:  x   ?    ?Pulmonary    ?Oxygen at home: x   ?Productive cough:     ?Wheezing:     ?    ?Neurologic    ?Sudden weakness in arms or legs:     ?Sudden numbness in arms or legs:     ?Sudden onset of difficulty speaking or slurred speech:    ?Temporary loss of vision in one eye:     ?Problems with  dizziness:     ?    ?Gastrointestinal    ?Blood in stool:     ?Vomited blood:     ?    ?Genitourinary    ?Burning when urinating:     ?Blood in urine:    ?    ?Psychiatric    ?Major depression:     ?    ?Hematologic    ?Bleeding problems:    ?Problems with blood clotting too easily:    ?    ?Skin    ?Rashes or ulcers:    ?    ?Constitutional    ?Fever or chills:    ?- ? ?PHYSICAL EXAM:  ? ?Vitals:  ? 06/24/21 1034  ?BP: 101/74  ?Pulse: 71  ?Resp: 20  ?Temp: 98.8 ?F (37.1 ?C)  ?SpO2: 95%  ?Weight: 156 lb (70.8 kg)  ?Height: 6\' 1"  (1.854 m)  ? ?Body mass index is 20.58 kg/m?. ?GENERAL: The patient is a well-nourished male, in no acute distress. The vital signs are documented above. ?CARDIAC: There is a regular rate and rhythm.  ?VASCULAR: He has a palpable radial pulse bilaterally. ?On exam he appears to have a nice left forearm cephalic vein. ?On the right side it looks like he had a previous radiocephalic fistula which is chronically occluded. ?PULMONARY: There is good air exchange bilaterally without wheezing or rales. ?MUSCULOSKELETAL: There are no major deformities. ?NEUROLOGIC: No focal weakness or paresthesias are detected. ?SKIN: There are no ulcers or rashes noted. ?PSYCHIATRIC: The patient has a normal affect. ? ?DATA:   ? ?ARTERIAL DUPLEX: I have reviewed the arterial duplex scan that was done on 05/31/2021. ? ?On the right side there was a high bifurcation of the brachial artery in the proximal upper arm.  There was a triphasic radial and ulnar signal with the Doppler. ? ?On the left side the brachial artery measured 6.2 mm in diameter.  There was a triphasic radial and ulnar signal. ? ?UPPER EXTREMITY VEIN MAP: I have reviewed his upper extremity vein map that was done on 05/31/2021. ? 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  The basilic vein looks reasonable in size with diameters ranging from 2.9-5.2 mm. ? ?On the left side, the forearm cephalic vein  is marginal in size.  The  upper arm cephalic vein is fairly small.  The basilic vein has diameters ranging from 3.7-4.4 millimeters. ? ? ?[name] ?[facility] ?

## 2021-06-24 NOTE — Progress Notes (Signed)
? ?ASSESSMENT & PLAN  ? ?END-STAGE RENAL DISEASE: This patient is referred for new hemodialysis access.  He has a functioning right IJ tunneled dialysis catheter which was just moved from the left 2 weeks ago.  He has an occluded left upper arm graft that was placed in [place].  On exam he has a reasonable cephalic vein in the forearm and I think he is a candidate for a left radiocephalic fistula.  His vein map suggest that the vein is adequate.  On the right side he has a high bifurcation so I would recommend continuing to attempt access of the right arm.  The alternative on the right would be a basilic vein transposition which would likely be done in 2 stages.  We have discussed the indications for the procedure and the potential complications and he is agreeable to proceed.  He dialyzes on Monday Wednesdays and Fridays and this has been scheduled for 06/29/2021.  He had been on Eliquis but this has been discontinued as apparently had been having some bleeding problems. ? ?REASON FOR CONSULT:   ? ?To evaluate for hemodialysis access.  The consult is requested by Dr. [name]. ? ?HPI:  ? ?Geroge Sims is a 71 y.o. male who tells me that he has been on dialysis for 15 years.  He had a upper arm graft placed on the left arm in [place].  It looks like he had access in the right arm also in the forearm and upper arm.  He had a left IJ catheter that he was using but this was not functioning so 2 weeks ago the catheter was moved to the right side.  He dialyzes on Monday Wednesdays and Fridays.  He had been on Eliquis for atrial fibrillation but had some bleeding issues and was told to discontinue this.  He denies any recent uremic symptoms. ? ?Past Medical History:  ?Diagnosis Date  ? A-fib ([place])   ? Edema   ? ESRD (end stage renal disease) ([place])   ? History of insertion of tunneled central venous catheter (CVC) with port   ? Hypotension   ? ? ?Family History  ?Problem Relation Age of Onset  ? Hypertension Mother   ?  Diabetes Mother   ? Heart attack Father   ? ? ?SOCIAL HISTORY: ?Social History  ? ?Tobacco Use  ? Smoking status: Former  ?  Types: Cigarettes  ? Smokeless tobacco: Not on file  ?Substance Use Topics  ? Alcohol use: Not Currently  ? ? ?Allergies  ?Allergen Reactions  ? Povidone Iodine Rash  ?  Betadine   ? ? ?Current Outpatient Medications  ?Medication Sig Dispense Refill  ? AURYXIA 1 GM 210 MG(Fe) tablet Take 420 mg by mouth 3 (three) times daily with meals.    ? calcitRIOL (ROCALTROL) 0.25 MCG capsule Take 5 capsules (1.25 mcg total) by mouth every Monday, Wednesday, and Friday with hemodialysis. 12 capsule 0  ? carvedilol (COREG) 6.25 MG tablet Take 1 tablet (6.25 mg total) by mouth 2 (two) times daily. 30 tablet 0  ? HYDROcodone-acetaminophen (NORCO) 10-325 MG tablet Take 1 tablet by mouth 2 (two) times daily as needed for moderate pain.    ? vitamin C (ASCORBIC ACID) 500 MG tablet Take 500 mg by mouth daily.    ? zinc sulfate 220 (50 Zn) MG capsule Take 220 mg by mouth daily.    ? apixaban (ELIQUIS) 5 MG TABS tablet Take 1 tablet (5 mg total) by mouth 2 (two) times daily. (  Patient not taking: Reported on 06/24/2021) 60 tablet 11  ? ?No current facility-administered medications for this visit.  ? ? ?REVIEW OF SYSTEMS:  ?[X]  denotes positive finding, [ ]  denotes negative finding ?Cardiac  Comments:  ?Chest pain or chest pressure:    ?Shortness of breath upon exertion: x   ?Short of breath when lying flat:    ?Irregular heart rhythm:    ?    ?Vascular    ?Pain in calf, thigh, or hip brought on by ambulation:    ?Pain in feet at night that wakes you up from your sleep:  x   ?Blood clot in your veins:    ?Leg swelling:  x   ?    ?Pulmonary    ?Oxygen at home: x   ?Productive cough:     ?Wheezing:     ?    ?Neurologic    ?Sudden weakness in arms or legs:     ?Sudden numbness in arms or legs:     ?Sudden onset of difficulty speaking or slurred speech:    ?Temporary loss of vision in one eye:     ?Problems with  dizziness:     ?    ?Gastrointestinal    ?Blood in stool:     ?Vomited blood:     ?    ?Genitourinary    ?Burning when urinating:     ?Blood in urine:    ?    ?Psychiatric    ?Major depression:     ?    ?Hematologic    ?Bleeding problems:    ?Problems with blood clotting too easily:    ?    ?Skin    ?Rashes or ulcers:    ?    ?Constitutional    ?Fever or chills:    ?- ? ?PHYSICAL EXAM:  ? ?Vitals:  ? 06/24/21 1034  ?BP: 101/74  ?Pulse: 71  ?Resp: 20  ?Temp: 98.8 ?F (37.1 ?C)  ?SpO2: 95%  ?Weight: 156 lb (70.8 kg)  ?Height: 6\' 1"  (1.854 m)  ? ?Body mass index is 20.58 kg/m?. ?GENERAL: The patient is a well-nourished male, in no acute distress. The vital signs are documented above. ?CARDIAC: There is a regular rate and rhythm.  ?VASCULAR: He has a palpable radial pulse bilaterally. ?On exam he appears to have a nice left forearm cephalic vein. ?On the right side it looks like he had a previous radiocephalic fistula which is chronically occluded. ?PULMONARY: There is good air exchange bilaterally without wheezing or rales. ?MUSCULOSKELETAL: There are no major deformities. ?NEUROLOGIC: No focal weakness or paresthesias are detected. ?SKIN: There are no ulcers or rashes noted. ?PSYCHIATRIC: The patient has a normal affect. ? ?DATA:   ? ?ARTERIAL DUPLEX: I have reviewed the arterial duplex scan that was done on 05/31/2021. ? ?On the right side there was a high bifurcation of the brachial artery in the proximal upper arm.  There was a triphasic radial and ulnar signal with the Doppler. ? ?On the left side the brachial artery measured 6.2 mm in diameter.  There was a triphasic radial and ulnar signal. ? ?UPPER EXTREMITY VEIN MAP: I have reviewed his upper extremity vein map that was done on 05/31/2021. ? 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  The basilic vein looks reasonable in size with diameters ranging from 2.9-5.2 mm. ? ?On the left side, the forearm cephalic vein  is marginal in size.  The  upper arm cephalic vein is fairly small.  The basilic vein has diameters ranging from 3.7-4.4 millimeters. ? ? ?[name] ?[facility] ?

## 2021-06-25 ENCOUNTER — Other Ambulatory Visit: Payer: Self-pay

## 2021-06-25 ENCOUNTER — Encounter (HOSPITAL_COMMUNITY): Payer: Self-pay | Admitting: Vascular Surgery

## 2021-06-25 NOTE — Progress Notes (Signed)
Mr. Aceituno daughter Phylis Bougie answered the best number to reach, Ms Jimmye Norman is a designated party to speak to. Ms Jimmye Norman stated that Mr. Capriotti does not complain of chest pain or shortness of breath. Ms. Jimmye Norman denies having any s/s of Covid in Mr, Jimmye Norman home.  Patient has not had any known exposure to Covid.  ? ?Mr. Buerkle's PCP is Dr Caren Macadam; cardiologist is Dr. Ali Lowe. ? ?I instructed M<s Jimmye Norman to have Mr.Netherton wash up well with antibacteria soap.  No nail polish, artificial or acrylic nails. Wear clean clothes, brush your teeth. ?Glasses, contact lens,dentures or partials may not be worn in the OR. If you need to wear them, please bring a case for glasses, do not wear contacts or bring a case, the hospital does not have contact cases, dentures or partials will have to be removed , make sure they are clean, we will provide a denture cup to put them in. You will need some one to drive you home and a responsible person over the age of 6 to stay with you for the first 24 hours after surgery.  ?

## 2021-06-28 NOTE — Anesthesia Preprocedure Evaluation (Addendum)
Anesthesia Evaluation  ?Patient identified by MRN, date of birth, ID band ?Patient awake ? ? ? ?Reviewed: ?Allergy & Precautions, NPO status , Patient's Chart, lab work & pertinent test results ? ?Airway ?Mallampati: II ? ?TM Distance: >3 FB ?Neck ROM: Full ? ? ? Dental ? ?(+) Edentulous Upper, Edentulous Lower ?  ?Pulmonary ?neg pulmonary ROS, former smoker,  ?  ?Pulmonary exam normal ? ? ? ? ? ? ? Cardiovascular ?hypertension, Pt. on medications and Pt. on home beta blockers ?+ dysrhythmias Atrial Fibrillation  ?Rhythm:Regular Rate:Normal ? ? ?  ?Neuro/Psych ?negative neurological ROS ? negative psych ROS  ? GI/Hepatic ?negative GI ROS, Neg liver ROS,   ?Endo/Other  ?negative endocrine ROS ? Renal/GU ?CRF and DialysisRenal disease  ?negative genitourinary ?  ?Musculoskeletal ?negative musculoskeletal ROS ?(+)  ? Abdominal ?Normal abdominal exam  (+)   ?Peds ? Hematology ?negative hematology ROS ?(+)   ?Anesthesia Other Findings ? ? Reproductive/Obstetrics ? ?  ? ? ? ? ? ? ? ? ? ? ? ? ? ?  ?  ? ? ? ? ? ? ? ?Anesthesia Physical ?Anesthesia Plan ? ?ASA: 3 ? ?Anesthesia Plan: Regional and MAC  ? ?Post-op Pain Management:   ? ?Induction: Intravenous ? ?PONV Risk Score and Plan: 1 and Ondansetron, Dexamethasone, Propofol infusion and Treatment may vary due to age or medical condition ? ?Airway Management Planned: Simple Face Mask, Natural Airway and Nasal Cannula ? ?Additional Equipment: None ? ?Intra-op Plan:  ? ?Post-operative Plan:  ? ?Informed Consent: I have reviewed the patients History and Physical, chart, labs and discussed the procedure including the risks, benefits and alternatives for the proposed anesthesia with the patient or authorized representative who has indicated his/her understanding and acceptance.  ? ? ? ?Dental advisory given ? ?Plan Discussed with: CRNA ? ?Anesthesia Plan Comments: (Lab Results ?     Component                Value               Date                  ?     WBC                      6.9                 06/12/2021           ?     HGB                      12.0 (L)            06/12/2021           ?     HCT                      39.5                06/12/2021           ?     MCV                      84.4                06/12/2021           ?     PLT  173                 06/12/2021           ?Lab Results ?     Component                Value               Date                 ?     NA                       137                 06/12/2021           ?     K                        4.3                 06/12/2021           ?     CO2                      28                  06/12/2021           ?     GLUCOSE                  98                  06/12/2021           ?     BUN                      30 (H)              06/12/2021           ?     CREATININE               5.38 (H)            06/12/2021           ?     CALCIUM                  9.1                 06/12/2021           ?     GFRNONAA                 11 (L)              06/12/2021           ?ECHO 03/23: ??1. Challenging to fully assess wall motion.. Left ventricular ejection  ?fraction, by estimation, is 50 to 55%. The left ventricle has low normal  ?function. The left ventricle demonstrates regional wall motion  ?abnormalities (see scoring diagram/findings  ?for description). There is mild left ventricular hypertrophy. Left  ?ventricular diastolic parameters are consistent with Grade I diastolic  ?dysfunction (impaired relaxation).  ??2. Right ventricular systolic function is normal. The right ventricular  ?size is normal. Tricuspid regurgitation signal is inadequate for assessing  ?PA pressure.  ??3. No evidence of mitral valve regurgitation. Severe mitral annular  ?  calcification.  ??4. The aortic valve is grossly normal. Aortic valve regurgitation is not  ?visualized.  ??5. The inferior vena cava is normal in size with greater than 50%  ?respiratory variability, suggesting right atrial pressure  of 3 mmHg.  ?)  ? ? ? ? ? ?Anesthesia Quick Evaluation ? ?

## 2021-06-29 ENCOUNTER — Ambulatory Visit (HOSPITAL_COMMUNITY)
Admission: RE | Admit: 2021-06-29 | Discharge: 2021-06-29 | Disposition: A | Discharge status: Home / Self Care | Payer: Medicare (Managed Care) | Attending: Vascular Surgery | Admitting: Vascular Surgery

## 2021-06-29 ENCOUNTER — Encounter (HOSPITAL_COMMUNITY): Payer: Self-pay | Admitting: Vascular Surgery

## 2021-06-29 ENCOUNTER — Ambulatory Visit (HOSPITAL_COMMUNITY): Payer: Medicare (Managed Care) | Admitting: Certified Registered"

## 2021-06-29 ENCOUNTER — Ambulatory Visit (HOSPITAL_BASED_OUTPATIENT_CLINIC_OR_DEPARTMENT_OTHER): Payer: Medicare (Managed Care) | Admitting: Certified Registered"

## 2021-06-29 ENCOUNTER — Encounter (HOSPITAL_COMMUNITY)
Admission: RE | Disposition: A | Discharge status: Home / Self Care | Payer: Self-pay | Source: Home / Self Care | Attending: Vascular Surgery

## 2021-06-29 ENCOUNTER — Other Ambulatory Visit: Payer: Self-pay

## 2021-06-29 DIAGNOSIS — N186 End stage renal disease: Secondary | ICD-10-CM | POA: Diagnosis not present

## 2021-06-29 DIAGNOSIS — Z992 Dependence on renal dialysis: Secondary | ICD-10-CM

## 2021-06-29 DIAGNOSIS — Z79899 Other long term (current) drug therapy: Secondary | ICD-10-CM | POA: Diagnosis not present

## 2021-06-29 DIAGNOSIS — T82898A Other specified complication of vascular prosthetic devices, implants and grafts, initial encounter: Secondary | ICD-10-CM | POA: Diagnosis not present

## 2021-06-29 DIAGNOSIS — I120 Hypertensive chronic kidney disease with stage 5 chronic kidney disease or end stage renal disease: Secondary | ICD-10-CM | POA: Diagnosis not present

## 2021-06-29 DIAGNOSIS — Y832 Surgical operation with anastomosis, bypass or graft as the cause of abnormal reaction of the patient, or of later complication, without mention of misadventure at the time of the procedure: Secondary | ICD-10-CM | POA: Insufficient documentation

## 2021-06-29 DIAGNOSIS — Z7901 Long term (current) use of anticoagulants: Secondary | ICD-10-CM | POA: Insufficient documentation

## 2021-06-29 DIAGNOSIS — I4891 Unspecified atrial fibrillation: Secondary | ICD-10-CM | POA: Insufficient documentation

## 2021-06-29 DIAGNOSIS — Z8249 Family history of ischemic heart disease and other diseases of the circulatory system: Secondary | ICD-10-CM | POA: Insufficient documentation

## 2021-06-29 DIAGNOSIS — I82711 Chronic embolism and thrombosis of superficial veins of right upper extremity: Secondary | ICD-10-CM | POA: Diagnosis not present

## 2021-06-29 DIAGNOSIS — Z87891 Personal history of nicotine dependence: Secondary | ICD-10-CM | POA: Insufficient documentation

## 2021-06-29 DIAGNOSIS — N185 Chronic kidney disease, stage 5: Secondary | ICD-10-CM | POA: Diagnosis not present

## 2021-06-29 HISTORY — PX: AV FISTULA PLACEMENT: SHX1204

## 2021-06-29 LAB — POCT I-STAT, CHEM 8
BUN: 32 mg/dL — ABNORMAL HIGH (ref 8–23)
Calcium, Ion: 1.16 mmol/L (ref 1.15–1.40)
Chloride: 99 mmol/L (ref 98–111)
Creatinine, Ser: 5.5 mg/dL — ABNORMAL HIGH (ref 0.61–1.24)
Glucose, Bld: 94 mg/dL (ref 70–99)
HCT: 44 % (ref 39.0–52.0)
Hemoglobin: 15 g/dL (ref 13.0–17.0)
Potassium: 3.9 mmol/L (ref 3.5–5.1)
Sodium: 139 mmol/L (ref 135–145)
TCO2: 29 mmol/L (ref 22–32)

## 2021-06-29 SURGERY — ARTERIOVENOUS (AV) FISTULA CREATION
Anesthesia: Monitor Anesthesia Care | Laterality: Left

## 2021-06-29 MED ORDER — 0.9 % SODIUM CHLORIDE (POUR BTL) OPTIME
TOPICAL | Status: DC | PRN
  Administered 2021-06-29: 1000 mL

## 2021-06-29 MED ORDER — ONDANSETRON HCL 4 MG/2ML IJ SOLN
INTRAMUSCULAR | Status: AC
  Filled 2021-06-29: qty 4

## 2021-06-29 MED ORDER — PAPAVERINE HCL 30 MG/ML IJ SOLN
INTRAMUSCULAR | Status: DC | PRN
  Administered 2021-06-29: 60 mg

## 2021-06-29 MED ORDER — PAPAVERINE HCL 30 MG/ML IJ SOLN
INTRAMUSCULAR | Status: AC
  Filled 2021-06-29: qty 2

## 2021-06-29 MED ORDER — PHENYLEPHRINE HCL-NACL 20-0.9 MG/250ML-% IV SOLN
INTRAVENOUS | Status: DC | PRN
  Administered 2021-06-29: 30 ug/min via INTRAVENOUS

## 2021-06-29 MED ORDER — LIDOCAINE HCL (PF) 2 % IJ SOLN
INTRAMUSCULAR | Status: DC | PRN
  Administered 2021-06-29: 400 mg via PERINEURAL

## 2021-06-29 MED ORDER — OXYCODONE-ACETAMINOPHEN 5-325 MG PO TABS: 1.0000 | ORAL_TABLET | ORAL | 0 refills | Status: DC | PRN

## 2021-06-29 MED ORDER — ONDANSETRON HCL 4 MG/2ML IJ SOLN
INTRAMUSCULAR | Status: DC | PRN
  Administered 2021-06-29: 4 mg via INTRAVENOUS

## 2021-06-29 MED ORDER — LIDOCAINE HCL 1 % IJ SOLN
INTRAMUSCULAR | Status: AC
  Filled 2021-06-29: qty 20

## 2021-06-29 MED ORDER — LIDOCAINE HCL (PF) 1 % IJ SOLN
INTRAMUSCULAR | Status: DC | PRN
  Administered 2021-06-29: 3.5 mL

## 2021-06-29 MED ORDER — CHLORHEXIDINE GLUCONATE 0.12 % MT SOLN
15.0000 mL | Freq: Once | OROMUCOSAL | Status: AC
  Administered 2021-06-29: 15 mL via OROMUCOSAL
  Filled 2021-06-29: qty 15

## 2021-06-29 MED ORDER — ORAL CARE MOUTH RINSE: 15.0000 mL | Freq: Once | OROMUCOSAL | Status: AC

## 2021-06-29 MED ORDER — LIDOCAINE 2% (20 MG/ML) 5 ML SYRINGE
INTRAMUSCULAR | Status: AC
  Filled 2021-06-29: qty 5

## 2021-06-29 MED ORDER — CHLORHEXIDINE GLUCONATE 4 % EX LIQD: 60.0000 mL | Freq: Once | CUTANEOUS | Status: DC

## 2021-06-29 MED ORDER — MIDAZOLAM HCL 2 MG/2ML IJ SOLN
INTRAMUSCULAR | Status: AC
  Filled 2021-06-29: qty 2

## 2021-06-29 MED ORDER — ACETAMINOPHEN 10 MG/ML IV SOLN: 1000.0000 mg | Freq: Once | INTRAVENOUS | Status: DC | PRN

## 2021-06-29 MED ORDER — HEPARIN 6000 UNIT IRRIGATION SOLUTION
Status: DC | PRN
  Administered 2021-06-29: 1

## 2021-06-29 MED ORDER — PROPOFOL 500 MG/50ML IV EMUL
INTRAVENOUS | Status: DC | PRN
  Administered 2021-06-29: 75 ug/kg/min via INTRAVENOUS

## 2021-06-29 MED ORDER — LIDOCAINE-EPINEPHRINE 1 %-1:100000 IJ SOLN
INTRAMUSCULAR | Status: AC
  Filled 2021-06-29: qty 1

## 2021-06-29 MED ORDER — PROPOFOL 10 MG/ML IV BOLUS
INTRAVENOUS | Status: AC
  Filled 2021-06-29: qty 20

## 2021-06-29 MED ORDER — CEFAZOLIN SODIUM-DEXTROSE 2-4 GM/100ML-% IV SOLN
2.0000 g | INTRAVENOUS | Status: AC
  Administered 2021-06-29: 2 g via INTRAVENOUS
  Filled 2021-06-29: qty 100

## 2021-06-29 MED ORDER — SODIUM CHLORIDE 0.9 % IV SOLN: INTRAVENOUS | Status: DC

## 2021-06-29 MED ORDER — MIDAZOLAM HCL 2 MG/2ML IJ SOLN
INTRAMUSCULAR | Status: DC | PRN
  Administered 2021-06-29: 2 mg via INTRAVENOUS

## 2021-06-29 MED ORDER — CLONIDINE HCL (ANALGESIA) 100 MCG/ML EP SOLN
EPIDURAL | Status: DC | PRN
  Administered 2021-06-29: 50 ug

## 2021-06-29 MED ORDER — HEPARIN 6000 UNIT IRRIGATION SOLUTION
Status: AC
  Filled 2021-06-29: qty 500

## 2021-06-29 MED ORDER — PROTAMINE SULFATE 10 MG/ML IV SOLN
INTRAVENOUS | Status: DC | PRN
  Administered 2021-06-29: 40 mg via INTRAVENOUS

## 2021-06-29 MED ORDER — FENTANYL CITRATE (PF) 100 MCG/2ML IJ SOLN: 25.0000 ug | INTRAMUSCULAR | Status: DC | PRN

## 2021-06-29 MED ORDER — CARVEDILOL 3.125 MG PO TABS
6.2500 mg | ORAL_TABLET | Freq: Once | ORAL | Status: AC
  Administered 2021-06-29: 6.25 mg via ORAL
  Filled 2021-06-29: qty 2

## 2021-06-29 MED ORDER — PHENYLEPHRINE 40 MCG/ML (10ML) SYRINGE FOR IV PUSH (FOR BLOOD PRESSURE SUPPORT)
PREFILLED_SYRINGE | INTRAVENOUS | Status: DC | PRN
  Administered 2021-06-29: 120 ug via INTRAVENOUS

## 2021-06-29 MED ORDER — HEPARIN SODIUM (PORCINE) 1000 UNIT/ML IJ SOLN
INTRAMUSCULAR | Status: DC | PRN
  Administered 2021-06-29: 7000 [IU] via INTRAVENOUS

## 2021-06-29 SURGICAL SUPPLY — 29 items
ARMBAND PINK RESTRICT EXTREMIT (MISCELLANEOUS) ×3 IMPLANT
CANISTER SUCT 3000ML PPV (MISCELLANEOUS) ×2 IMPLANT
CANNULA VESSEL 3MM 2 BLNT TIP (CANNULA) ×3 IMPLANT
CLIP VESOCCLUDE MED 6/CT (CLIP) ×2 IMPLANT
CLIP VESOCCLUDE SM WIDE 6/CT (CLIP) ×4 IMPLANT
COVER PROBE W GEL 5X96 (DRAPES) ×1 IMPLANT
DERMABOND ADVANCED (GAUZE/BANDAGES/DRESSINGS) ×1
DERMABOND ADVANCED .7 DNX12 (GAUZE/BANDAGES/DRESSINGS) ×1 IMPLANT
ELECT REM PT RETURN 9FT ADLT (ELECTROSURGICAL) ×2
ELECTRODE REM PT RTRN 9FT ADLT (ELECTROSURGICAL) ×1 IMPLANT
GLOVE SRG 8 PF TXTR STRL LF DI (GLOVE) ×1 IMPLANT
GLOVE SURG ENC MOIS LTX SZ7.5 (GLOVE) ×2 IMPLANT
GLOVE SURG UNDER POLY LF SZ8 (GLOVE) ×1
GOWN STRL REUS W/ TWL LRG LVL3 (GOWN DISPOSABLE) ×3 IMPLANT
GOWN STRL REUS W/TWL LRG LVL3 (GOWN DISPOSABLE) ×3
KIT BASIN OR (CUSTOM PROCEDURE TRAY) ×2 IMPLANT
KIT TURNOVER KIT B (KITS) ×2 IMPLANT
NS IRRIG 1000ML POUR BTL (IV SOLUTION) ×2 IMPLANT
PACK CV ACCESS (CUSTOM PROCEDURE TRAY) ×2 IMPLANT
PAD ARMBOARD 7.5X6 YLW CONV (MISCELLANEOUS) ×4 IMPLANT
SLING ARM FOAM STRAP LRG (SOFTGOODS) ×1 IMPLANT
SLING ARM FOAM STRAP MED (SOFTGOODS) IMPLANT
SUT MNCRL AB 4-0 PS2 18 (SUTURE) ×2 IMPLANT
SUT PROLENE 6 0 BV (SUTURE) ×3 IMPLANT
SUT VIC AB 3-0 SH 27 (SUTURE) ×1
SUT VIC AB 3-0 SH 27X BRD (SUTURE) ×1 IMPLANT
TOWEL GREEN STERILE (TOWEL DISPOSABLE) ×2 IMPLANT
UNDERPAD 30X36 HEAVY ABSORB (UNDERPADS AND DIAPERS) ×2 IMPLANT
WATER STERILE IRR 1000ML POUR (IV SOLUTION) ×2 IMPLANT

## 2021-06-29 NOTE — Op Note (Signed)
? ? ?  NAME: [name]    MRN: [id number] ?DOB: 08-16-1950    DATE OF OPERATION: 06/29/2021 ? ?PREOP DIAGNOSIS:   ? ?End-stage renal disease ? ?POSTOP DIAGNOSIS:   ? ?Same ? ?PROCEDURE:  ?  ?Left radiocephalic AV fistula ? ?SURGEON: Judeth Cornfield. Scot Dock, MD ? ?ASSIST: Arlee Muslim, PA ? ?ANESTHESIA: Block and local ? ?EBL: Minimal ? ?INDICATIONS:  ? ? [name] is a 71 y.o. male who presents for new access.  He had a left upper arm graft placed in [place] which is occluded.  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  He presents for access in the left arm. ? ?FINDINGS:  ? ?I felt that the forearm cephalic vein looked reasonable.  This did empty into the basilic system. ? ?TECHNIQUE:  ? ?The patient was taken to the operating room and a block had been placed by anesthesia.  The left arm was prepped and draped in usual sterile fashion.  I did look at the vein myself with the SonoSite and I felt that the forearm cephalic vein was reasonable.  An oblique incision was made in the left wrist after the patient was infiltrated with 1% lidocaine.  I dissected free the cephalic vein which was ligated distally and irrigated up with heparinized saline.  It was about a 3.5 mm vein.  Branches were divided tween clips and 3-0 silk ties.  The radial artery was dissected free beneath the fascia.  He did have some plaque in it.  However there is no significant stenosis noted.  The patient was heparinized.  The radial artery was clamped proximally and distally and a longitudinal arteriotomy was made.  The vein was spatulated and sewn end-to-side to the artery using continuous 6-0 Prolene suture.  I passed a 2 dilator in both directions at the completion.  There was a good thrill in the fistula at the completion.  We went through approximately 6 Dopplers and were unable to find a Doppler which worked.  However the hand was warm and well-perfused.  The heparin was partially reversed with protamine.  The wound was  closed with interrupted 3-0 Vicryl's and the skin closed with 4-0 Monocryl.  Dermabond was applied.  The patient tolerated the procedure well was transferred to the recovery room in stable condition.  All needle and sponge counts were correct. ? ?Given the complexity of the case,  the assistant was necessary in order to expedient the procedure and safely perform the technical aspects of the operation.  The assistant provided traction and countertraction to assist with exposure of the artery and vein.  They also assisted with suture ligation of multiple venous branches.  They played a critical role in the anastomosis. These skills, especially following the Prolene suture for the anastomosis, could not have been adequately performed by a scrub tech assistant.  ? ? ?Given the complexity of the case a first assistant was necessary in order to expedient the procedure and safely perform the technical aspects of the operation. ? ?[name], MD, FACS ?[facility] ? ?DATE OF DICTATION:   06/29/2021 ? ?

## 2021-06-29 NOTE — Anesthesia Postprocedure Evaluation (Signed)
Anesthesia Post Note ? ?Patient: [name] ? ?Procedure(s) Performed: LEFT RADIOCEPHALIC ARTERIOVENOUS (AV) FISTULA CREATION (Left) ? ?  ? ?Patient location during evaluation: PACU ?Anesthesia Type: Regional and MAC ?Level of consciousness: awake and alert ?Pain management: pain level controlled ?Vital Signs Assessment: post-procedure vital signs reviewed and stable ?Respiratory status: spontaneous breathing, nonlabored ventilation, respiratory function stable and patient connected to nasal cannula oxygen ?Cardiovascular status: stable and blood pressure returned to baseline ?Postop Assessment: no apparent nausea or vomiting ?Anesthetic complications: no ? ? ?No notable events documented. ? ?Last Vitals:  ?Vitals:  ? 06/29/21 0932 06/29/21 0947  ?BP: (!) 85/60 92/65  ?Pulse: 74 78  ?Resp: (!) 21 20  ?Temp:  36.6 ?C  ?SpO2: 97% 96%  ?  ?Last Pain:  ?Vitals:  ? 06/29/21 0947  ?TempSrc:   ?PainSc: 0-No pain  ? ? ?  ?  ?  ?  ?  ?  ? ?March Rummage Haidyn Kilburg ? ? ? ? ?

## 2021-06-29 NOTE — Anesthesia Procedure Notes (Signed)
Anesthesia Regional Block: Supraclavicular block  ? ?Pre-Anesthetic Checklist: , timeout performed,  Correct Patient, Correct Site, Correct Laterality,  Correct Procedure, Correct Position, site marked,  Risks and benefits discussed,  Surgical consent,  Pre-op evaluation,  At surgeon's request and post-op pain management ? ?Laterality: Left ? ?Prep: Dura Prep     ?  ?Needles:  ?Injection technique: Single-shot ? ?Needle Type: Echogenic Stimulator Needle   ? ? ?Needle Length: 5cm  ?Needle Gauge: 20  ? ? ? ?Additional Needles: ? ? ?Procedures:,,,, ultrasound used (permanent image in chart),,    ?Narrative:  ?Start time: 06/29/2021 7:25 AM ?End time: 06/29/2021 7:29 AM ?Injection made incrementally with aspirations every 5 mL. ? ?Performed by: Personally  ?Anesthesiologist: Darral Dash, DO ? ?Additional Notes: ?Patient identified. Risks/Benefits/Options discussed with patient including but not limited to bleeding, infection, nerve damage, failed block, incomplete pain control. Patient expressed understanding and wished to proceed. All questions were answered. Sterile technique was used throughout the entire procedure. Please see nursing notes for vital signs. Aspirated in 5cc intervals with injection for negative confirmation. Patient was given instructions on fall risk and not to get out of bed. All questions and concerns addressed with instructions to call with any issues or inadequate analgesia.   ?  ? ? ? ? ?

## 2021-06-29 NOTE — Transfer of Care (Signed)
Immediate Anesthesia Transfer of Care Note ? ?Patient: [name] ? ?Procedure(s) Performed: LEFT RADIOCEPHALIC ARTERIOVENOUS (AV) FISTULA CREATION (Left) ? ?Patient Location: PACU ? ?Anesthesia Type:MAC and Regional ? ?Level of Consciousness: awake, alert , oriented and drowsy ? ?Airway & Oxygen Therapy: Patient Spontanous Breathing and Patient connected to face mask oxygen ? ?Post-op Assessment: Report given to RN, Post -op Vital signs reviewed and stable and Patient moving all extremities ? ?Post vital signs: Reviewed and stable ? ?Last Vitals:  ?Vitals Value Taken Time  ?BP 99/38 06/29/21 0917  ?Temp    ?Pulse 79 06/29/21 0917  ?Resp 14 06/29/21 0917  ?SpO2 99 % 06/29/21 0917  ?Vitals shown include unvalidated device data. ? ?Last Pain:  ?Vitals:  ? 06/29/21 0621  ?TempSrc:   ?PainSc: 0-No pain  ?   ? ?  ? ?Complications: No notable events documented. ?

## 2021-06-29 NOTE — Discharge Instructions (Signed)
° °  [facility] ° °Discharge Instructions ° °AV Fistula or Graft Surgery for Dialysis Access ° °Please refer to the following instructions for your post-procedure care. Your surgeon or physician assistant will discuss any changes with you. ° °Activity ° °You may drive the day following your surgery, if you are comfortable and no longer taking prescription pain medication. Resume full activity as the soreness in your incision resolves. ° °Bathing/Showering ° °You may shower after you go home. Keep your incision dry for 48 hours. Do not soak in a bathtub, hot tub, or swim until the incision heals completely. You may not shower if you have a hemodialysis catheter. ° °Incision Care ° °Clean your incision with mild soap and water after 48 hours. Pat the area dry with a clean towel. You do not need a bandage unless otherwise instructed. Do not apply any ointments or creams to your incision. You may have skin glue on your incision. Do not peel it off. It will come off on its own in about one week. Your arm may swell a bit after surgery. To reduce swelling use pillows to elevate your arm so it is above your heart. Your doctor will tell you if you need to lightly wrap your arm with an ACE bandage. ° °Diet ° °Resume your normal diet. There are not special food restrictions following this procedure. In order to heal from your surgery, it is CRITICAL to get adequate nutrition. Your body requires vitamins, minerals, and protein. Vegetables are the best source of vitamins and minerals. Vegetables also provide the perfect balance of protein. Processed food has little nutritional value, so try to avoid this. ° °Medications ° °Resume taking all of your medications. If your incision is causing pain, you may take over-the counter pain relievers such as acetaminophen (Tylenol). If you were prescribed a stronger pain medication, please be aware these medications can cause nausea and constipation. Prevent  nausea by taking the medication with a snack or meal. Avoid constipation by drinking plenty of fluids and eating foods with high amount of fiber, such as fruits, vegetables, and grains. Do not take Tylenol if you are taking prescription pain medications. ° ° ° ° °Follow up °Your surgeon may want to see you in the office following your access surgery. If so, this will be arranged at the time of your surgery. ° °Please call us immediately for any of the following conditions: ° °Increased pain, redness, drainage (pus) from your incision site °Fever of 101 degrees or higher °Severe or worsening pain at your incision site °Hand pain or numbness. ° °Reduce your risk of vascular disease: ° °Stop smoking. If you would like help, call [facility] at 1-800-QUIT-NOW (1-800-784-8669) or [facility] at 336-586-4000 ° °Manage your cholesterol °Maintain a desired weight °Control your diabetes °Keep your blood pressure down ° °Dialysis ° °It will take several weeks to several months for your new dialysis access to be ready for use. Your surgeon will determine when it is OK to use it. Your nephrologist will continue to direct your dialysis. You can continue to use your Permcath until your new access is ready for use. ° °If you have any questions, please call the office at [phone]. ° °

## 2021-06-29 NOTE — Interval H&P Note (Signed)
History and Physical Interval Note: ? ?06/29/2021 ?7:10 AM ? ?[name]  has presented today for surgery, with the diagnosis of ESRD.  The various methods of treatment have been discussed with the patient and family. After consideration of risks, benefits and other options for treatment, the patient has consented to  Procedure(s): ?LEFT RADIOCEPHALIC ARTERIOVENOUS (AV) FISTULA CREATION (Left) as a surgical intervention.  The patient's history has been reviewed, patient examined, no change in status, stable for surgery.  I have reviewed the patient's chart and labs.  Questions were answered to the patient's satisfaction.   ? ? ?[name] ? ? ?

## 2021-06-30 ENCOUNTER — Telehealth: Payer: Self-pay | Admitting: *Deleted

## 2021-06-30 ENCOUNTER — Other Ambulatory Visit: Payer: Self-pay | Admitting: Vascular Surgery

## 2021-06-30 ENCOUNTER — Encounter (HOSPITAL_COMMUNITY): Payer: Self-pay | Admitting: Vascular Surgery

## 2021-06-30 MED ORDER — OXYCODONE-ACETAMINOPHEN 5-325 MG PO TABS: 1.0000 | ORAL_TABLET | ORAL | 0 refills | Status: AC | PRN

## 2021-06-30 NOTE — Progress Notes (Signed)
I sent a prescription for pain medicine for this patient as the original prescription they were out. ?

## 2021-06-30 NOTE — Telephone Encounter (Signed)
Patient daughter called stating Rx for pain Medication [facility] by [facility] Scot Dock [facility] she is requesting we send it to [facility] on [address]. Spoke to Dr Scot Dock he sent a new Rx to [facility].I notified patients daughter Rx resent. ?

## 2021-07-01 ENCOUNTER — Other Ambulatory Visit: Payer: Self-pay | Admitting: Vascular Surgery

## 2021-07-01 MED ORDER — OXYCODONE-ACETAMINOPHEN 5-325 MG PO TABS: 1.0000 | ORAL_TABLET | ORAL | 0 refills | Status: AC | PRN

## 2021-07-01 NOTE — Progress Notes (Signed)
I sent the prescription in again for Mr. Florio's they would only except 1 pill every 4 hours not 1 to 2 tablets every 4 hours ?

## 2021-07-06 ENCOUNTER — Encounter (HOSPITAL_BASED_OUTPATIENT_CLINIC_OR_DEPARTMENT_OTHER): Payer: Medicare (Managed Care) | Attending: Internal Medicine | Admitting: Internal Medicine

## 2021-07-06 DIAGNOSIS — L89892 Pressure ulcer of other site, stage 2: Secondary | ICD-10-CM | POA: Diagnosis not present

## 2021-07-06 DIAGNOSIS — Z8619 Personal history of other infectious and parasitic diseases: Secondary | ICD-10-CM | POA: Insufficient documentation

## 2021-07-06 DIAGNOSIS — Z992 Dependence on renal dialysis: Secondary | ICD-10-CM | POA: Insufficient documentation

## 2021-07-06 DIAGNOSIS — L89313 Pressure ulcer of right buttock, stage 3: Secondary | ICD-10-CM | POA: Diagnosis not present

## 2021-07-06 DIAGNOSIS — Z87891 Personal history of nicotine dependence: Secondary | ICD-10-CM | POA: Insufficient documentation

## 2021-07-06 DIAGNOSIS — L89153 Pressure ulcer of sacral region, stage 3: Secondary | ICD-10-CM | POA: Insufficient documentation

## 2021-07-06 DIAGNOSIS — N186 End stage renal disease: Secondary | ICD-10-CM | POA: Insufficient documentation

## 2021-07-06 NOTE — Progress Notes (Addendum)
Kerens, Tyjae ([id number]) ?Visit Report for 07/06/2021 ?Arrival Information Details ?Patient Name: Date of Service: ?[name], [name] 07/06/2021 11:00 A M ?Medical Record Number: [id number] ?Patient Account Number: [id number] ?Date of Birth/Sex: Treating RN: ?07-Jul-1950 (71 y.o. [name] ?Primary Care Nyhla Mountjoy: [name] Other Clinician: ?Referring Gizell Danser: ?Treating Taegen Delker/Extender: [name] ?[name], [name] ?Weeks in Treatment: 11 ?Visit Information History Since Last Visit ?Added or deleted any medications: No ?Patient Arrived: Wheel Chair ?Any new allergies or adverse reactions: No ?Arrival Time: 11:00 ?Had a fall or experienced change in No ?Accompanied By: daughter ?activities of daily living that may affect ?Transfer Assistance: None ?risk of falls: ?Patient Identification Verified: Yes ?Signs or symptoms of abuse/neglect since last visito No ?Secondary Verification Process Completed: Yes ?Hospitalized since last visit: No ?Patient Requires Transmission-Based Precautions: No ?Implantable device outside of the clinic excluding No ?Patient Has Alerts: No ?cellular tissue based products placed in the center ?since last visit: ?Pain Present Now: No ?Electronic Signature(s) ?Signed: 07/06/2021 5:04:09 PM By: [name] ?Entered By: [name] on 07/06/2021 11:01:44 ?-------------------------------------------------------------------------------- ?Clinic Level of Care Assessment Details ?Patient Name: Date of Service: ?[name], [name] 07/06/2021 11:00 A M ?Medical Record Number: [id number] ?Patient Account Number: [id number] ?Date of Birth/Sex: Treating RN: ?1950-08-11 (71 y.o. [name] ?Primary Care Vivienne Sangiovanni: [name] Other Clinician: ?Referring Reathel Turi: ?Treating Tierra Divelbiss/Extender: [name] ?[name], [name] ?Weeks in Treatment: 11 ?Clinic Level of Care Assessment Items ?TOOL 4 Quantity Score ?X- 1 0 ?Use when only an EandM is performed on FOLLOW-UP  visit ?ASSESSMENTS - Nursing Assessment / Reassessment ?X- 1 10 ?Reassessment of Co-morbidities (includes updates in patient status) ?X- 1 5 ?Reassessment of Adherence to Treatment Plan ?ASSESSMENTS - Wound and Skin A ssessment / Reassessment ?[]  - 0 ?Simple Wound Assessment / Reassessment - one wound ?X- 2 5 ?Complex Wound Assessment / Reassessment - multiple wounds ?[]  - 0 ?Dermatologic / Skin Assessment (not related to wound area) ?ASSESSMENTS - Focused Assessment ?[]  - 0 ?Circumferential Edema Measurements - multi extremities ?[]  - 0 ?Nutritional Assessment / Counseling / Intervention ?[]  - 0 ?Lower Extremity Assessment (monofilament, tuning fork, pulses) ?[]  - 0 ?Peripheral Arterial Disease Assessment (using hand held doppler) ?ASSESSMENTS - Ostomy and/or Continence Assessment and Care ?[]  - 0 ?Incontinence Assessment and Management ?[]  - 0 ?Ostomy Care Assessment and Management (repouching, etc.) ?PROCESS - Coordination of Care ?[]  - 0 ?Simple Patient / Family Education for ongoing care ?X- 1 20 ?Complex (extensive) Patient / Family Education for ongoing care ?X- 1 10 ?Staff obtains Consents, Records, T Results / Process Orders ?est ?[]  - 0 ?Staff telephones HHA, Nursing Homes / Clarify orders / etc ?[]  - 0 ?Routine Transfer to another Facility (non-emergent condition) ?[]  - 0 ?Routine Hospital Admission (non-emergent condition) ?[]  - 0 ?New Admissions / Biomedical engineer / Ordering NPWT Apligraf, etc. ?, ?[]  - 0 ?Emergency Hospital Admission (emergent condition) ?[]  - 0 ?Simple Discharge Coordination ?[]  - 0 ?Complex (extensive) Discharge Coordination ?PROCESS - Special Needs ?[]  - 0 ?Pediatric / Minor Patient Management ?[]  - 0 ?Isolation Patient Management ?[]  - 0 ?Hearing / Language / Visual special needs ?[]  - 0 ?Assessment of Community assistance (transportation, D/C planning, etc.) ?[]  - 0 ?Additional assistance / Altered mentation ?[]  - 0 ?Support Surface(s) Assessment (bed, cushion, seat,  etc.) ?INTERVENTIONS - Wound Cleansing / Measurement ?[]  - 0 ?Simple Wound Cleansing - one wound ?X- 2 5 ?Complex Wound Cleansing - multiple wounds ?X- 1 5 ?Wound Imaging (photographs - any  number of wounds) ?[]  - 0 ?Wound Tracing (instead of photographs) ?[]  - 0 ?Simple Wound Measurement - one wound ?X- 2 5 ?Complex Wound Measurement - multiple wounds ?INTERVENTIONS - Wound Dressings ?X - Small Wound Dressing one or multiple wounds 1 10 ?X- 1 15 ?Medium Wound Dressing one or multiple wounds ?[]  - 0 ?Large Wound Dressing one or multiple wounds ?[]  - 0 ?Application of Medications - topical ?[]  - 0 ?Application of Medications - injection ?INTERVENTIONS - Miscellaneous ?[]  - 0 ?External ear exam ?[]  - 0 ?Specimen Collection (cultures, biopsies, blood, body fluids, etc.) ?[]  - 0 ?Specimen(s) / Culture(s) sent or taken to Lab for analysis ?[]  - 0 ?Patient Transfer (multiple staff / Civil Service fast streamer / Similar devices) ?[]  - 0 ?Simple Staple / Suture removal (25 or less) ?[]  - 0 ?Complex Staple / Suture removal (26 or more) ?[]  - 0 ?Hypo / Hyperglycemic Management (close monitor of Blood Glucose) ?[]  - 0 ?Ankle / Brachial Index (ABI) - do not check if billed separately ?X- 1 5 ?Vital Signs ?Has the patient been seen at the hospital within the last three years: Yes ?Total Score: 110 ?Level Of Care: New/Established - Level 3 ?Electronic Signature(s) ?Signed: 07/06/2021 5:04:09 PM By: [name] ?Entered By: [name] on 07/06/2021 11:44:01 ?-------------------------------------------------------------------------------- ?Encounter Discharge Information Details ?Patient Name: Date of Service: ?[name], [name] 07/06/2021 11:00 A M ?Medical Record Number: [id number] ?Patient Account Number: [id number] ?Date of Birth/Sex: Treating RN: ?25-Mar-1950 (71 y.o. [name] ?Primary Care Keywon Mestre: [name] Other Clinician: ?Referring Airyonna Franklyn: ?Treating Laya Letendre/Extender: [name] ?[name], [name] ?Weeks in  Treatment: 11 ?Encounter Discharge Information Items ?Discharge Condition: Stable ?Ambulatory Status: Wheelchair ?Discharge Destination: Home ?Transportation: Private Auto ?Accompanied By: daughter ?Schedule Follow-up Appointment: Yes ?Clinical Summary of Care: Provided on 07/06/2021 ?Form Type Recipient ?Paper Patient Patient ?Electronic Signature(s) ?Signed: 07/06/2021 5:04:09 PM By: [name] ?Entered By: [name] on 07/06/2021 11:48:06 ?-------------------------------------------------------------------------------- ?Lower Extremity Assessment Details ?Patient Name: Date of Service: ?[name], [name] 07/06/2021 11:00 A M ?Medical Record Number: [id number] ?Patient Account Number: [id number] ?Date of Birth/Sex: Treating RN: ?October 11, 1950 (71 y.o. [name] ?Primary Care Tenesia Escudero: [name] Other Clinician: ?Referring Taneya Conkel: ?Treating Lachina Salsberry/Extender: [name] ?[name], [name] ?Weeks in Treatment: 11 ?Electronic Signature(s) ?Signed: 07/06/2021 5:04:09 PM By: [name] ?Entered By: [name] on 07/06/2021 11:04:02 ?-------------------------------------------------------------------------------- ?Multi Wound Chart Details ?Patient Name: ?Date of Service: ?[name], [name] 07/06/2021 11:00 A M ?Medical Record Number: [id number] ?Patient Account Number: [id number] ?Date of Birth/Sex: ?Treating RN: ?01-02-1951 (71 y.o. [name] ?Primary Care Heinrich Fertig: [name] ?Other Clinician: ?Referring Lakasha Mcfall: ?Treating Tavaughn Silguero/Extender: [name] ?[name], [name] ?Weeks in Treatment: 11 ?Vital Signs ?Height(in): 73 ?Pulse(bpm): 69 ?Weight(lbs): 147 ?Blood Pressure(mmHg): 118/78 ?Body Mass Index(BMI): 19.4 ?Temperature(??F): 98 ?Respiratory Rate(breaths/min): 16 ?Photos: ?Sacrum Right Trochanter Right Gluteus ?Wound Location: ?Pressure Injury Pressure Injury Pressure Injury ?Wounding Event: ?Pressure Ulcer Pressure Ulcer Pressure Ulcer ?Primary Etiology: ?Arrhythmia  Arrhythmia Arrhythmia ?Comorbid History: ?01/26/2021 01/26/2021 01/26/2021 ?Date Acquired: ?11 11 11  ?Weeks of Treatment: ?Open Open Healed - Epithelialized ?Wound Status: ?No No No ?Wound Recurrence: ?1x0.4x0.5 7.4x1.7x0.

## 2021-07-08 NOTE — Progress Notes (Signed)
Sims, Leonard ([id number]) ?Visit Report for 07/06/2021 ?Chief Complaint Document Details ?Patient Name: Date of Service: ?[name], [name] ID 07/06/2021 11:00 A M ?Medical Record Number: [id number] ?Patient Account Number: [id number] ?Date of Birth/Sex: Treating RN: ?08/14/1950 (71 y.o. M) Barnhart, Jodi ?Primary Care Provider: [name], [name] Other Clinician: ?Referring Provider: ?Treating Provider/Extender: Hoffman, Jessica ?[name], [name] ?Weeks in Treatment: 11 ?Information Obtained from: Patient ?Chief Complaint ?Multiple pressure ulcers to the sacrum, hip, buttocks and knee ?Electronic Signature(s) ?Signed: 07/07/2021 5:24:17 PM By: Hoffman, Jessica DO ?Entered By: Hoffman, Jessica on 07/07/2021 16:39:13 ?-------------------------------------------------------------------------------- ?HPI Details ?Patient Name: Date of Service: ?[name], [name] ID 07/06/2021 11:00 A M ?Medical Record Number: [id number] ?Patient Account Number: [id number] ?Date of Birth/Sex: Treating RN: ?08/03/1950 (71 y.o. M) Barnhart, Jodi ?Primary Care Provider: [name], [name] Other Clinician: ?Referring Provider: ?Treating Provider/Extender: Hoffman, Jessica ?[name], [name] ?Weeks in Treatment: 11 ?History of Present Illness ?HPI Description: Admission 04/19/2021 ?[name] is a 70-year-old male with a past medical history of end-stage renal disease on hemodialysis, Substance abuse and hep C that presents to the ?clinic for multiple pressure ulcers to his bottom, sacrum and right knee. His daughter is present and helps provide the history. [place] ?he fell and developed pressure injuries. He declined going to a skilled nursing facility and had not been properly managing the wound beds. He ended up being ?hospitalized on 12/27/2020 for sepsis secondary to wound infections. He did require intubation for respiratory support. Since then he has moved to live with his ?daughter who helps take care of the wounds. She has  been using calcium alginate to the wound beds. He currently denies signs of infection. ?2/13; patient presents for follow-up. He has been using Hydrofera Blue to the right hip wound and right knee wound and Dakin's to the sacral and gluteus ?wound. He denies signs of infection. He has a hard time offloading the wound beds however does try to do so. Please no issues or complaints today. ?3/16; patient presents for follow-up. He has been using Hydrofera Blue to the right hip wound and right knee. He reports that the right knee wound is healed. He ?continues Dakin's to the sacral and gluteus wound. He obtained his sacral x-ray. He has no issues or complaints today. He denies signs of infection. ?4/19; patient presents for follow-up. He has been using Hydrofera Blue to the right hip wound and Dakin's wet-to-dry to the sacral and gluteus wound. The ?gluteus wound has healed. He has no issues or complaints today. He reports improvement in wound healing. He denies signs of infection. ?Electronic Signature(s) ?Signed: 07/07/2021 5:24:17 PM By: Hoffman, Jessica DO ?Entered By: Hoffman, Jessica on 07/07/2021 16:39:41 ?-------------------------------------------------------------------------------- ?Physical Exam Details ?Patient Name: Date of Service: ?[name], [name] ID 07/06/2021 11:00 A M ?Medical Record Number: [id number] ?Patient Account Number: [id number] ?Date of Birth/Sex: Treating RN: ?04/28/1950 (71 y.o. M) Barnhart, Jodi ?Primary Care Provider: [name], [name] Other Clinician: ?Referring Provider: ?Treating Provider/Extender: Hoffman, Jessica ?[name], [name] ?Weeks in Treatment: 11 ?Constitutional ?respirations regular, non-labored and within target range for patient.. ?Psychiatric ?pleasant and cooperative. ?Notes ?T the sacrum there is an open wound with granulation tissue present. T the right hip there is a large open wound with granulation tissue throughout and ?o o ?epithelization occurring to the edges  circumferentially. Epithelization to the previous wound site on the right ischium. ?Electronic Signature(s) ?Signed: 07/07/2021 5:24:17 PM By: Hoffman, Jessica DO ?Entered By: Hoffman, Jessica on 07/07/2021 16:40:29 ?-------------------------------------------------------------------------------- ?Physician   Orders Details ?Patient Name: Date of Service: ?[name], [name] ID 07/06/2021 11:00 A M ?Medical Record Number: [id number] ?Patient Account Number: [id number] ?Date of Birth/Sex: Treating RN: ?07/19/1950 (71 y.o. M) Barnhart, Jodi ?Primary Care Provider: [name], [name] Other Clinician: ?Referring Provider: ?Treating Provider/Extender: Hoffman, Jessica ?[name], [name] ?Weeks in Treatment: 11 ?Verbal / Phone Orders: No ?Diagnosis Coding ?ICD-10 Coding ?Code Description ?L89.153 Pressure ulcer of sacral region, stage 3 ?L89.313 Pressure ulcer of right buttock, stage 3 ?L89.892 Pressure ulcer of other site, stage 2 ?N18.6 End stage renal disease ?Follow-up Appointments ?ppointment in 2 weeks. - Dr Hoffman and [facility] Room # 9 ?Return A ?Bathing/ Shower/ Hygiene ?May shower with protection but do not get wound dressing(s) wet. ?Off-Loading ?Low air-loss mattress (Group 2) ?Turn and reposition every 2 hours ?Wound Treatment ?Wound #1 - Sacrum ?Cleanser: Wound Cleanser (Generic) 1 x Per Day/15 Days ?Discharge Instructions: Cleanse the wound with wound cleanser prior to applying a clean dressing using gauze sponges, not tissue or cotton balls. ?Cleanser: Byram Ancillary Kit - 15 Day Supply (Generic) 1 x Per Day/15 Days ?Discharge Instructions: Use supplies as instructed; Kit contains: (15) Saline Bullets; (15) 3x3 Gauze; 15 pr Gloves ?Prim Dressing: Dakin's Solution 0.125%, 16 (oz) (Generic) 1 x Per Day/15 Days ?ary ?Discharge Instructions: Moisten gauze with Dakin's solution ?Secondary Dressing: Woven Gauze Sponges 2x2 in (Generic) 1 x Per Day/15 Days ?Discharge Instructions: Apply over primary dressing as  directed. ?Secondary Dressing: Bordered Gauze, 4x4 in (Generic) 1 x Per Day/15 Days ?Discharge Instructions: Apply over primary dressing as directed. ?Secured With: 3M Medipore H Soft Cloth Surgical T ape, 4 x 10 (in/yd) (DME) (Generic) 1 x Per Day/15 Days ?Discharge Instructions: Secure with tape as directed. ?Wound #2 - Trochanter Wound Laterality: Right ?Cleanser: Wound Cleanser (Generic) 1 x Per Day/15 Days ?Discharge Instructions: Cleanse the wound with wound cleanser prior to applying a clean dressing using gauze sponges, not tissue or cotton balls. ?Cleanser: Byram Ancillary Kit - 15 Day Supply (Generic) 1 x Per Day/15 Days ?Discharge Instructions: Use supplies as instructed; Kit contains: (15) Saline Bullets; (15) 3x3 Gauze; 15 pr Gloves ?Prim Dressing: Hydrofera Blue Classic Foam, 4x4 in (Generic) 1 x Per Day/15 Days ?ary ?Discharge Instructions: Moisten with saline prior to applying to wound bed ?Secondary Dressing: Woven Gauze Sponges 2x2 in (Generic) 1 x Per Day/15 Days ?Discharge Instructions: Apply over primary dressing as directed. ?Secondary Dressing: Zetuvit Plus Silicone Border Dressing 7x7(in/in) (Generic) 1 x Per Day/15 Days ?Discharge Instructions: Apply silicone border over primary dressing as directed. ?Secured With: 3M Medipore H Soft Cloth Surgical T ape, 4 x 10 (in/yd) (DME) (Generic) 1 x Per Day/15 Days ?Discharge Instructions: Secure with tape as directed. ?Electronic Signature(s) ?Signed: 07/07/2021 5:24:17 PM By: Hoffman, Jessica DO ?Previous Signature: 07/06/2021 5:04:09 PM Version By: Barnhart, Jodi ?Entered By: Hoffman, Jessica on 07/07/2021 16:40:44 ?-------------------------------------------------------------------------------- ?Problem List Details ?Patient Name: ?Date of Service: ?[name], [name] ID 07/06/2021 11:00 A M ?Medical Record Number: [id number] ?Patient Account Number: [id number] ?Date of Birth/Sex: ?Treating RN: ?03/06/1951 (71 y.o. M) Barnhart, Jodi ?Primary Care Provider:  [name], [name] ?Other Clinician: ?Referring Provider: ?Treating Provider/Extender: Hoffman, Jessica ?[name], [name] ?Weeks in Treatment: 11 ?Active Problems ?ICD-10 ?Encounter ?Code Description Active Date MDM ?Diagnosis ?L8

## 2021-07-20 ENCOUNTER — Encounter (HOSPITAL_BASED_OUTPATIENT_CLINIC_OR_DEPARTMENT_OTHER): Payer: Medicare (Managed Care) | Attending: Internal Medicine | Admitting: Internal Medicine

## 2021-07-20 DIAGNOSIS — Z87891 Personal history of nicotine dependence: Secondary | ICD-10-CM | POA: Diagnosis not present

## 2021-07-20 DIAGNOSIS — L89153 Pressure ulcer of sacral region, stage 3: Secondary | ICD-10-CM | POA: Insufficient documentation

## 2021-07-20 DIAGNOSIS — N186 End stage renal disease: Secondary | ICD-10-CM | POA: Diagnosis not present

## 2021-07-20 DIAGNOSIS — I4891 Unspecified atrial fibrillation: Secondary | ICD-10-CM | POA: Insufficient documentation

## 2021-07-20 DIAGNOSIS — L89313 Pressure ulcer of right buttock, stage 3: Secondary | ICD-10-CM | POA: Diagnosis not present

## 2021-07-20 DIAGNOSIS — Z992 Dependence on renal dialysis: Secondary | ICD-10-CM | POA: Diagnosis not present

## 2021-07-20 DIAGNOSIS — S71001D Unspecified open wound, right hip, subsequent encounter: Secondary | ICD-10-CM | POA: Diagnosis not present

## 2021-07-20 DIAGNOSIS — X58XXXD Exposure to other specified factors, subsequent encounter: Secondary | ICD-10-CM | POA: Insufficient documentation

## 2021-07-20 DIAGNOSIS — L89892 Pressure ulcer of other site, stage 2: Secondary | ICD-10-CM | POA: Insufficient documentation

## 2021-07-22 NOTE — Progress Notes (Signed)
[place], [place] ([id number]) ?Visit Report for 07/20/2021 ?Chief Complaint Document Details ?Patient Name: Date of Service: ?[name] Sims, [name] Sims 07/20/2021 10:45 [name] Sims ?Medical Record Number: [id number] ?Patient Account Number: [id number] ?Date of Birth/Sex: Treating RN: ?[birth year] (71 y.o. [name] Sims, [name] ?Primary Care Provider: Ferne Sims Other Clinician: ?Referring Provider: ?Treating Provider/Extender: [name] Sims ?[name] Sims ?Weeks in Treatment: 13 ?Information Obtained from: Patient ?Chief Complaint ?Multiple pressure ulcers to the sacrum, hip, buttocks and knee ?Electronic Signature(s) ?Signed: 07/20/2021 12:21:21 PM By: Leonard Shan DO ?Entered By: [name] Sims on 07/20/2021 12:17:26 ?-------------------------------------------------------------------------------- ?Debridement Details ?Patient Name: Date of Service: ?[name] Sims, [name] Sims 07/20/2021 10:45 [name] Sims ?Medical Record Number: [id number] ?Patient Account Number: [id number] ?Date of Birth/Sex: Treating RN: ?10-26-1950 (71 y.o. [name] Sims ?Primary Care Provider: Ferne Sims Other Clinician: ?Referring Provider: ?Treating Provider/Extender: [name] Sims ?[name] Sims ?Weeks in Treatment: 13 ?Debridement Performed for Assessment: Wound #2 Right Trochanter ?Performed By: Physician Leonard Shan, DO ?Debridement Type: Debridement ?Level of Consciousness (Pre-procedure): Awake and Alert ?Pre-procedure Verification/Time Out Yes - 11:45 ?Taken: ?Start Time: 11:48 ?Pain Control: ?Other : benzocaine 20% spray ?T Area Debrided (L x W): ?otal 6 (cm) x 1 (cm) = 6 (cm?) ?Tissue and other material debrided: ?Viable, Non-Viable, Eschar, Subcutaneous, Skin: Epidermis ?Level: Skin/Subcutaneous Tissue ?Debridement Description: Excisional ?Instrument: Curette ?Bleeding: Minimum ?Hemostasis Achieved: Pressure ?Procedural Pain: 4 ?Post Procedural Pain: 2 ?Response to Treatment: Procedure was tolerated well ?Level of Consciousness (Post- Awake and  Alert ?procedure): ?Post Debridement Measurements of Total Wound ?Length: (cm) 3.8 ?Stage: Category/Stage III ?Width: (cm) 0.6 ?Depth: (cm) 0.1 ?Volume: (cm?) 0.179 ?Character of Wound/Ulcer Post Debridement: Improved ?Post Procedure Diagnosis ?Same as Pre-procedure ?Electronic Signature(s) ?Signed: 07/20/2021 12:21:21 PM By: Leonard Shan DO ?Signed: 07/21/2021 5:38:18 PM By: Baruch Gouty RN, BSN ?Entered By: Baruch Gouty on 07/20/2021 11:52:10 ?-------------------------------------------------------------------------------- ?HPI Details ?Patient Name: Date of Service: ?[name] Sims, [name] Sims 07/20/2021 10:45 [name] Sims ?Medical Record Number: [id number] ?Patient Account Number: [id number] ?Date of Birth/Sex: Treating RN: ?05/06/1950 (71 y.o. [name] Sims, [name] ?Primary Care Provider: Ferne Sims Other Clinician: ?Referring Provider: ?Treating Provider/Extender: [name] Sims ?[name] Sims ?Weeks in Treatment: 13 ?History of Present Illness ?HPI Description: Admission 04/19/2021 ?[name] Sims is [name] 71 year old male with [name] past medical history of end-stage renal disease on hemodialysis, Substance abuse and hep C that presents to the ?clinic for multiple pressure ulcers to his bottom, sacrum and right knee. His daughter is present and helps provide the history. [place] ?he fell and developed pressure injuries. He declined going to [name] skilled nursing facility and had not been properly managing the wound beds. He ended up being ?hospitalized on 12/27/2020 for sepsis secondary to wound infections. He did require intubation for respiratory support. Since then he has moved to live with his ?daughter who helps take care of the wounds. She has been using calcium alginate to the wound beds. He currently denies signs of infection. ?2/13; patient presents for follow-up. He has been using Hydrofera Blue to the right hip wound and right knee wound and Dakin's to the sacral and gluteus ?wound. He denies  signs of infection. He has [name] hard time offloading the wound beds however does try to do so. Please no issues or complaints today. ?3/16; patient presents for follow-up. He has been using Hydrofera Blue to the right hip wound and right knee. He reports that the right knee wound is healed. He ?continues Dakin's to the sacral and gluteus wound. He  obtained his sacral x-ray. He has no issues or complaints today. He denies signs of infection. ?4/19; patient presents for follow-up. He has been using Hydrofera Blue to the right hip wound and Dakin's wet-to-dry to the sacral and gluteus wound. The ?gluteus wound has healed. He has no issues or complaints today. He reports improvement in wound healing. He denies signs of infection. ?5/2; patient presents for follow-up. He has been using Hydrofera Blue to the right hip wound and Dakin's wet-to-dry dressings to the sacral wound. He has no ?issues or complaints today. He reports continued improvement in wound healing. ?Electronic Signature(s) ?Signed: 07/20/2021 12:21:21 PM By: Leonard Shan DO ?Entered By: [name] Sims on 07/20/2021 12:17:51 ?-------------------------------------------------------------------------------- ?Physical Exam Details ?Patient Name: Date of Service: ?[name] Sims, [name] Sims 07/20/2021 10:45 [name] Sims ?Medical Record Number: [id number] ?Patient Account Number: [id number] ?Date of Birth/Sex: Treating RN: ?May 10, 1950 (71 y.o. [name] Sims, [name] ?Primary Care Provider: Ferne Sims Other Clinician: ?Referring Provider: ?Treating Provider/Extender: [name] Sims ?[name] Sims ?Weeks in Treatment: 13 ?Constitutional ?respirations regular, non-labored and within target range for patient.Marland Kitchen ?Psychiatric ?pleasant and cooperative. ?Notes ?T the sacrum there is [name] very small slitlike open wound with granulation tissue present. This appears almost epithelialized. T the right hip there is an open ?o o ?wound with granulation tissue and circumferential devitalized  tissue. No signs of surrounding infection. ?Electronic Signature(s) ?Signed: 07/20/2021 12:21:21 PM By: Leonard Shan DO ?Entered By: [name] Sims on 07/20/2021 12:18:34 ?-------------------------------------------------------------------------------- ?Physician Orders Details ?Patient Name: Date of Service: ?[name] Sims, [name] Sims 07/20/2021 10:45 [name] Sims ?Medical Record Number: [id number] ?Patient Account Number: [id number] ?Date of Birth/Sex: Treating RN: ?[birth year] (71 y.o. [name] Sims ?Primary Care Provider: Ferne Sims Other Clinician: ?Referring Provider: ?Treating Provider/Extender: [name] Sims ?[name] Sims ?Weeks in Treatment: 13 ?Verbal / Phone Orders: No ?Diagnosis Coding ?ICD-10 Coding ?Code Description ?L89.153 Pressure ulcer of sacral region, stage 3 ?L89.313 Pressure ulcer of right buttock, stage 3 ?C94.496 Pressure ulcer of other site, stage 2 ?N18.6 End stage renal disease ?Follow-up Appointments ?ppointment in 2 weeks. - Dr Heber Waldo and Allayne Butcher Room # 9 ?Return [name] ?5/16 @ 10:45 am ?Bathing/ Shower/ Hygiene ?May shower with protection but do not get wound dressing(s) wet. ?Off-Loading ?Low air-loss mattress (Group 2) ?Turn and reposition every 2 hours ?Wound Treatment ?Wound #1 - Sacrum ?Cleanser: Wound Cleanser (Generic) 1 x Per Day/15 Days ?Discharge Instructions: Cleanse the wound with wound cleanser prior to applying [name] clean dressing using gauze sponges, not tissue or cotton balls. ?Cleanser: Byram Ancillary Kit - 15 Day Supply (Generic) 1 x Per Day/15 Days ?Discharge Instructions: Use supplies as instructed; Kit contains: (15) Saline Bullets; (15) 3x3 Gauze; 15 pr Gloves ?Prim Dressing: Hydrofera Blue Ready Foam, 2.5 x2.5 in 1 x Per Day/15 Days ?ary ?Discharge Instructions: Apply to wound bed as instructed ?Secondary Dressing: Bordered Gauze, 4x4 in (Generic) 1 x Per Day/15 Days ?Discharge Instructions: Apply over primary dressing as directed. ?Secured With: 59M Medipore H Soft Cloth  Surgical T ape, 4 x 10 (in/yd) (Generic) 1 x Per Day/15 Days ?Discharge Instructions: Secure with tape as directed. ?Wound #2 - Trochanter Wound Laterality: Right ?Cleanser: Wound Cleanser (Generic)

## 2021-07-27 NOTE — Progress Notes (Signed)
[place], [place] ([id number]) ?Visit Report for 07/20/2021 ?Arrival Information Details ?Patient Name: Date of Service: ?[name], [name] 07/20/2021 10:45 A M ?Medical Record Number: [id number] ?Patient Account Number: [id number] ?Date of Birth/Sex: Treating RN: ?January 11, 1951 (71 y.o. Ernestene Mention ?Primary Care Gurkaran Rahm: [name] Other Clinician: ?Referring Analiz Tvedt: ?Treating Calhoun Reichardt/Extender: [name] ?[name], [name] ?Weeks in Treatment: 13 ?Visit Information History Since Last Visit ?Added or deleted any medications: No ?Patient Arrived: Wheel Chair ?Any new allergies or adverse reactions: No ?Arrival Time: 11:16 ?Had a fall or experienced change in No ?Accompanied By: granddgt ?activities of daily living that may affect ?Transfer Assistance: Manual ?risk of falls: ?Patient Identification Verified: Yes ?Signs or symptoms of abuse/neglect since last visito No ?Secondary Verification Process Completed: Yes ?Hospitalized since last visit: No ?Patient Requires Transmission-Based Precautions: No ?Implantable device outside of the clinic excluding No ?Patient Has Alerts: No ?cellular tissue based products placed in the center ?since last visit: ?Has Dressing in Place as Prescribed: Yes ?Pain Present Now: Yes ?Electronic Signature(s) ?Signed: 07/21/2021 5:38:18 PM By: Baruch Gouty RN, BSN ?Entered By: Baruch Gouty on 07/20/2021 11:20:41 ?-------------------------------------------------------------------------------- ?Encounter Discharge Information Details ?Patient Name: Date of Service: ?[name], [name] 07/20/2021 10:45 A M ?Medical Record Number: [id number] ?Patient Account Number: [id number] ?Date of Birth/Sex: Treating RN: ?Jul 14, 1950 (71 y.o. Ernestene Mention ?Primary Care Paquita Printy: [name] Other Clinician: ?Referring Roby Spalla: ?Treating Stephen Turnbaugh/Extender: [name] ?[name], [name] ?Weeks in Treatment: 13 ?Encounter Discharge Information Items Post Procedure Vitals ?Discharge  Condition: Stable ?Temperature (F): 98.6 ?Ambulatory Status: Wheelchair ?Pulse (bpm): 80 ?Discharge Destination: Home ?Respiratory Rate (breaths/min): 18 ?Transportation: Private Auto ?Blood Pressure (mmHg): 107/75 ?Accompanied By: granddgt ?Schedule Follow-up Appointment: Yes ?Clinical Summary of Care: Patient Declined ?Electronic Signature(s) ?Signed: 07/21/2021 5:38:18 PM By: Baruch Gouty RN, BSN ?Entered By: Baruch Gouty on 07/20/2021 12:06:06 ?-------------------------------------------------------------------------------- ?Lower Extremity Assessment Details ?Patient Name: ?Date of Service: ?[name], [name] 07/20/2021 10:45 A M ?Medical Record Number: [id number] ?Patient Account Number: [id number] ?Date of Birth/Sex: ?Treating RN: ?1950-12-26 (71 y.o. Ernestene Mention ?Primary Care Maeva Dant: [name] ?Other Clinician: ?Referring Quinn Bartling: ?Treating Iveliz Garay/Extender: [name] ?[name], [name] ?Weeks in Treatment: 13 ?Electronic Signature(s) ?Signed: 07/21/2021 5:38:18 PM By: Baruch Gouty RN, BSN ?Entered By: Baruch Gouty on 07/20/2021 11:22:31 ?-------------------------------------------------------------------------------- ?Multi Wound Chart Details ?Patient Name: ?Date of Service: ?[name], [name] 07/20/2021 10:45 A M ?Medical Record Number: [id number] ?Patient Account Number: [id number] ?Date of Birth/Sex: ?Treating RN: ?1951/01/18 (71 y.o. [name], [name] ?Primary Care Meryle Pugmire: [name] ?Other Clinician: ?Referring Genecis Veley: ?Treating Sirus Labrie/Extender: [name] ?[name], [name] ?Weeks in Treatment: 13 ?Vital Signs ?Height(in): 73 ?Pulse(bpm): 80 ?Weight(lbs): 147 ?Blood Pressure(mmHg): 107/75 ?Body Mass Index(BMI): 19.4 ?Temperature(??F): 98.6 ?Respiratory Rate(breaths/min): 18 ?Photos: [N/A:N/A] ?Sacrum Right Trochanter N/A ?Wound Location: ?Pressure Injury Pressure Injury N/A ?Wounding Event: ?Pressure Ulcer Pressure Ulcer N/A ?Primary Etiology: ?Arrhythmia  Arrhythmia N/A ?Comorbid History: ?01/26/2021 01/26/2021 N/A ?Date Acquired: ?71 13 N/A ?Weeks of Treatment: ?Open Open N/A ?Wound Status: ?No No N/A ?Wound Recurrence: ?1x0.2x0.1 2.7x0.4x0.1 N/A ?Measurements L x W x D (cm) ?0.157 0.848 N/A ?A (cm?) : ?rea ?0.016 0.085 N/A ?Volume (cm?) : ?95.00% 98.50% N/A ?% Reduction in A rea: ?99.40% 99.20% N/A ?% Reduction in Volume: ?Category/Stage III Category/Stage III N/A ?Classification: ?Small Medium N/A ?Exudate A mount: ?Serous Serosanguineous N/A ?Exudate Type: ?amber red, brown N/A ?Exudate Color: ?Thickened Distinct, outline attached N/A ?Wound Margin: ?Large (67-100%) Large (67-100%) N/A ?Granulation A mount: ?Pink Red, Pink N/A ?Granulation Quality: ?None Present (0%)  None Present (0%) N/A ?Necrotic A mount: ?Fat Layer (Subcutaneous Tissue): Yes Fat Layer (Subcutaneous Tissue): Yes N/A ?Exposed Structures: ?Fascia: No ?Fascia: No ?Tendon: No ?Tendon: No ?Muscle: No ?Muscle: No ?Joint: No ?Joint: No ?Bone: No ?Bone: No ?Medium (34-66%) Medium (34-66%) N/A ?Epithelialization: ?N/A Debridement - Excisional N/A ?Debridement: ?Pre-procedure Verification/Time Out N/A 11:45 N/A ?Taken: ?N/A Other N/A ?Pain Control: ?N/A Necrotic/Eschar, Subcutaneous N/A ?Tissue Debrided: ?N/A Skin/Subcutaneous Tissue N/A ?Level: ?N/A 6 N/A ?Debridement A (sq cm): ?rea ?N/A Curette N/A ?Instrument: ?N/A Minimum N/A ?Bleeding: ?N/A Pressure N/A ?Hemostasis A chieved: ?N/A 4 N/A ?Procedural Pain: ?N/A 2 N/A ?Post Procedural Pain: ?N/A Procedure was tolerated well N/A ?Debridement Treatment Response: ?N/A 3.8x0.6x0.1 N/A ?Post Debridement Measurements L x ?W x D (cm) ?N/A 0.179 N/A ?Post Debridement Volume: (cm?) ?N/A Category/Stage III N/A ?Post Debridement Stage: ?N/A Debridement N/A ?Procedures Performed: ?Treatment Notes ?Wound #1 (Sacrum) ?Cleanser ?Wound Cleanser ?Discharge Instruction: Cleanse the wound with wound cleanser prior to applying a clean dressing using gauze sponges, not  tissue or cotton balls. ?Byram Ancillary Kit - 15 Day Supply ?Discharge Instruction: Use supplies as instructed; Kit contains: (15) Saline Bullets; (15) 3x3 Gauze; 15 pr Gloves ?Peri-Wound Care ?Topical ?Primary Dressing ?Hydrofera Blue Ready Foam, 2.5 x2.5 in ?Discharge Instruction: Apply to wound bed as instructed ?Secondary Dressing ?Bordered Gauze, 4x4 in ?Discharge Instruction: Apply over primary dressing as directed. ?Secured With ?1M Medipore H Soft Cloth Surgical T ape, 4 x 10 (in/yd) ?Discharge Instruction: Secure with tape as directed. ?Compression Wrap ?Compression Stockings ?Add-Ons ?Wound #2 (Trochanter) Wound Laterality: Right ?Cleanser ?Wound Cleanser ?Discharge Instruction: Cleanse the wound with wound cleanser prior to applying a clean dressing using gauze sponges, not tissue or cotton balls. ?Byram Ancillary Kit - 15 Day Supply ?Discharge Instruction: Use supplies as instructed; Kit contains: (15) Saline Bullets; (15) 3x3 Gauze; 15 pr Gloves ?Peri-Wound Care ?Topical ?Primary Dressing ?Hydrofera Blue Classic Foam, 4x4 in ?Discharge Instruction: Moisten with saline prior to applying to wound bed ?Secondary Dressing ?Zetuvit Plus Silicone Border Dressing 7x7(in/in) ?Discharge Instruction: Apply silicone border over primary dressing as directed. ?Secured With ?1M Medipore H Soft Cloth Surgical T ape, 4 x 10 (in/yd) ?Discharge Instruction: Secure with tape as directed. ?Compression Wrap ?Compression Stockings ?Add-Ons ?Electronic Signature(s) ?Signed: 07/20/2021 12:21:21 PM By: [name] DO ?Signed: 07/22/2021 4:12:14 PM By: Rhae Hammock RN ?Entered By: [name] on 07/20/2021 12:17:19 ?-------------------------------------------------------------------------------- ?Multi-Disciplinary Care Plan Details ?Patient Name: ?Date of Service: ?[name], [name] 07/20/2021 10:45 A M ?Medical Record Number: [id number] ?Patient Account Number: [id number] ?Date of Birth/Sex: ?Treating RN: ?Nov 17, 1950 (71  y.o. Ernestene Mention ?Primary Care Wataru Mccowen: [name] ?Other Clinician: ?Referring Jaquan Sadowsky: ?Treating Ysabel Cowgill/Extender: [name] ?[name], [name] ?Weeks in Treatment: 13 ?Multidisciplinary

## 2021-08-03 ENCOUNTER — Encounter (HOSPITAL_BASED_OUTPATIENT_CLINIC_OR_DEPARTMENT_OTHER): Payer: Medicare (Managed Care) | Admitting: Internal Medicine

## 2021-08-03 DIAGNOSIS — L89892 Pressure ulcer of other site, stage 2: Secondary | ICD-10-CM

## 2021-08-03 DIAGNOSIS — S71001D Unspecified open wound, right hip, subsequent encounter: Secondary | ICD-10-CM

## 2021-08-03 DIAGNOSIS — L89153 Pressure ulcer of sacral region, stage 3: Secondary | ICD-10-CM | POA: Diagnosis not present

## 2021-08-03 NOTE — Progress Notes (Signed)
[name], [name] ([id number]) ?Visit Report for 08/03/2021 ?Arrival Information Details ?Patient Name: Date of Service: ?[name] 08/03/2021 10:45 Leonard M ?Medical Record Number: [id number] ?Patient Account Number: [id number] ?Date of Birth/Sex: Treating RN: ?12-Jan-1951 (71 y.o. [name], [name] ?Primary Care [name]: [name] Other Clinician: ?Referring [name]: ?Treating [name]/Extender: [name] ?[name] ?Weeks in Treatment: 15 ?Visit Information History Since Last Visit ?Added or deleted any medications: No ?Patient Arrived: Wheel Chair ?Any new allergies or adverse reactions: No ?Arrival Time: 10:53 ?Had Leonard fall or experienced change in No ?Accompanied By: daughter ?activities of daily living that may affect ?Transfer Assistance: Manual ?risk of falls: ?Patient Identification Verified: Yes ?Signs or symptoms of abuse/neglect since last visito No ?Secondary Verification Process Completed: Yes ?Hospitalized since last visit: No ?Patient Requires Transmission-Based Precautions: No ?Implantable device outside of the clinic excluding No ?Patient Has Alerts: No ?cellular tissue based products placed in the center ?since last visit: ?Has Dressing in Place as Prescribed: Yes ?Pain Present Now: Yes ?Electronic Signature(s) ?Signed: 08/03/2021 4:47:11 PM By: Leonard Hammock RN ?Entered By: [name] on 08/03/2021 10:54:12 ?-------------------------------------------------------------------------------- ?Encounter Discharge Information Details ?Patient Name: Date of Service: ?[name] 08/03/2021 10:45 Leonard M ?Medical Record Number: [id number] ?Patient Account Number: [id number] ?Date of Birth/Sex: Treating RN: ?[birth year] (71 y.o. [name], [name] ?Primary Care Aryona Sill: [name] Other Clinician: ?Referring [name]: ?Treating [name]/Extender: [name] ?[name] ?Weeks in Treatment: 15 ?Encounter Discharge Information Items Post Procedure  Vitals ?Discharge Condition: Stable ?Temperature (F): 98.7 ?Ambulatory Status: Wheelchair ?Pulse (bpm): 74 ?Discharge Destination: Home ?Respiratory Rate (breaths/min): 17 ?Transportation: Private Auto ?Blood Pressure (mmHg): 134/74 ?Accompanied By: daughter ?Schedule Follow-up Appointment: Yes ?Clinical Summary of Care: Patient Declined ?Electronic Signature(s) ?Signed: 08/03/2021 4:47:11 PM By: Leonard Hammock RN ?Entered By: [name] on 08/03/2021 11:37:28 ?-------------------------------------------------------------------------------- ?Lower Extremity Assessment Details ?Patient Name: ?Date of Service: ?[name] 08/03/2021 10:45 Leonard M ?Medical Record Number: [id number] ?Patient Account Number: [id number] ?Date of Birth/Sex: ?Treating RN: ?1950/08/09 (71 y.o. [name], [name] ?Primary Care [name]: [name] ?Other Clinician: ?Referring Chawn Sims: ?Treating [name]/Extender: [name] ?[name] ?Weeks in Treatment: 15 ?Electronic Signature(s) ?Signed: 08/03/2021 4:47:11 PM By: Leonard Hammock RN ?Entered By: [name] on 08/03/2021 10:57:19 ?-------------------------------------------------------------------------------- ?Multi Wound Chart Details ?Patient Name: ?Date of Service: ?[name] 08/03/2021 10:45 Leonard M ?Medical Record Number: [id number] ?Patient Account Number: [id number] ?Date of Birth/Sex: ?Treating RN: ?[birth year] (71 y.o. [name], [name] ?Primary Care [name]: [name] ?Other Clinician: ?Referring [name]: ?Treating [name]/Extender: [name] ?[name] ?Weeks in Treatment: 15 ?Vital Signs ?Height(in): 73 ?Pulse(bpm): 113 ?Weight(lbs): 147 ?Blood Pressure(mmHg): 112/74 ?Body Mass Index(BMI): 19.4 ?Temperature(??F): 98.7 ?Respiratory Rate(breaths/min): 17 ?Photos: [N/Leonard:N/Leonard] ?Sacrum Right Trochanter N/Leonard ?Wound Location: ?Pressure Injury Pressure Injury N/Leonard ?Wounding Event: ?Pressure Ulcer Pressure Ulcer N/Leonard ?Primary  Etiology: ?Arrhythmia Arrhythmia N/Leonard ?Comorbid History: ?01/26/2021 01/26/2021 N/Leonard ?Date Acquired: ?20 15 N/Leonard ?Weeks of Treatment: ?Open Open N/Leonard ?Wound Status: ?No No N/Leonard ?Wound Recurrence: ?0x0x0 0.1x0.1x0.1 N/Leonard ?Measurements L x W x D (cm) ?0 0.008 N/Leonard ?Leonard (cm?) : ?rea ?0 0.001 N/Leonard ?Volume (cm?) : ?100.00% 100.00% N/Leonard ?% Reduction in Leonard rea: ?100.00% 100.00% N/Leonard ?% Reduction in Volume: ?Category/Stage III Category/Stage III N/Leonard ?Classification: ?Small Medium N/Leonard ?Exudate Leonard mount: ?Serous Serosanguineous N/Leonard ?Exudate Type: ?amber red, brown N/Leonard ?Exudate Color: ?Thickened Distinct, outline attached N/Leonard ?Wound Margin: ?Large (67-100%) Large (67-100%) N/Leonard ?Granulation Leonard mount: ?Pink Red, Pink N/Leonard ?Granulation Quality: ?None Present (0%) None Present (0%)  N/Leonard ?Necrotic Leonard mount: ?Fat Layer (Subcutaneous Tissue): Yes Fat Layer (Subcutaneous Tissue): Yes N/Leonard ?Exposed Structures: ?Fascia: No ?Fascia: No ?Tendon: No ?Tendon: No ?Muscle: No ?Muscle: No ?Joint: No ?Joint: No ?Bone: No ?Bone: No ?Medium (34-66%) Medium (34-66%) N/Leonard ?Epithelialization: ?N/Leonard Debridement - Excisional N/Leonard ?Debridement: ?Pre-procedure Verification/Time Out N/Leonard 11:30 N/Leonard ?Taken: ?N/Leonard Lidocaine N/Leonard ?Pain Control: ?N/Leonard Subcutaneous, Slough N/Leonard ?Tissue Debrided: ?N/Leonard Skin/Subcutaneous Tissue N/Leonard ?Level: ?N/Leonard 0.01 N/Leonard ?Debridement Leonard (sq cm): ?rea ?N/Leonard Curette N/Leonard ?Instrument: ?N/Leonard Minimum N/Leonard ?Bleeding: ?N/Leonard Pressure N/Leonard ?Hemostasis Leonard chieved: ?N/Leonard 0 N/Leonard ?Procedural Pain: ?N/Leonard 0 N/Leonard ?Post Procedural Pain: ?N/Leonard Procedure was tolerated well N/Leonard ?Debridement Treatment Response: ?N/Leonard 0.5x0.5x0.1 N/Leonard ?Post Debridement Measurements L x ?W x D (cm) ?N/Leonard 0.02 N/Leonard ?Post Debridement Volume: (cm?) ?N/Leonard Debridement N/Leonard ?Procedures Performed: ?Treatment Notes ?Wound #1 (Sacrum) ?Cleanser ?Wound Cleanser ?Discharge Instruction: Cleanse the wound with wound cleanser prior to applying Leonard clean dressing using gauze sponges, not tissue or cotton balls. ?Byram Ancillary Kit -  15 Day Supply ?Discharge Instruction: Use supplies as instructed; Kit contains: (15) Saline Bullets; (15) 3x3 Gauze; 15 pr Gloves ?Peri-Wound Care ?Topical ?Primary Dressing ?Hydrofera Blue Ready Foam, 2.5 x2.5 in ?Discharge Instruction: Apply to wound bed as instructed ?Secondary Dressing ?Bordered Gauze, 4x4 in ?Discharge Instruction: Apply over primary dressing as directed. ?Secured With ?62M Medipore H Soft Cloth Surgical T ape, 4 x 10 (in/yd) ?Discharge Instruction: Secure with tape as directed. ?Compression Wrap ?Compression Stockings ?Add-Ons ?Wound #2 (Trochanter) Wound Laterality: Right ?Cleanser ?Wound Cleanser ?Discharge Instruction: Cleanse the wound with wound cleanser prior to applying Leonard clean dressing using gauze sponges, not tissue or cotton balls. ?Byram Ancillary Kit - 15 Day Supply ?Discharge Instruction: Use supplies as instructed; Kit contains: (15) Saline Bullets; (15) 3x3 Gauze; 15 pr Gloves ?Peri-Wound Care ?Topical ?Primary Dressing ?Hydrofera Blue Classic Foam, 4x4 in ?Discharge Instruction: Moisten with saline prior to applying to wound bed ?Secondary Dressing ?Zetuvit Plus Silicone Border Dressing 7x7(in/in) ?Discharge Instruction: Apply silicone border over primary dressing as directed. ?Secured With ?62M Medipore H Soft Cloth Surgical T ape, 4 x 10 (in/yd) ?Discharge Instruction: Secure with tape as directed. ?Compression Wrap ?Compression Stockings ?Add-Ons ?Electronic Signature(s) ?Signed: 08/03/2021 12:53:05 PM By: Leonard Shan DO ?Signed: 08/03/2021 4:47:11 PM By: Leonard Hammock RN ?Entered By: [name] on 08/03/2021 12:33:17 ?-------------------------------------------------------------------------------- ?Multi-Disciplinary Care Plan Details ?Patient Name: ?Date of Service: ?[name] 08/03/2021 10:45 Leonard M ?Medical Record Number: [id number] ?Patient Account Number: [id number] ?Date of Birth/Sex: ?Treating RN: ?[birth year] (71 y.o. [name], [name] ?Primary Care  Akili Cuda: [name] ?Other Clinician: ?Referring Zohal Reny: ?Treating Shirl Ludington/Extender: [name] ?[name] ?Weeks in Treatment: 15 ?Multidisciplinary Care Plan reviewed with physician ?Active Inac

## 2021-08-03 NOTE — Progress Notes (Signed)
[place], [place] ([id number]) ?Visit Report for 08/03/2021 ?Chief Complaint Document Details ?Patient Name: Date of Service: ?[name], [name] 08/03/2021 10:45 A M ?Medical Record Number: [id number] ?Patient Account Number: [id number] ?Date of Birth/Sex: Treating RN: ?[birth year] (71 y.o. [name], [name] ?Primary Care Provider: [name] Other Clinician: ?Referring Provider: ?Treating Provider/Extender: [name] ?[name], [name] ?Weeks in Treatment: 15 ?Information Obtained from: Patient ?Chief Complaint ?Multiple pressure ulcers to the sacrum, hip, buttocks and knee ?Electronic Signature(s) ?Signed: 08/03/2021 12:53:05 PM By: [name] DO ?Entered By: [name] on 08/03/2021 12:33:33 ?-------------------------------------------------------------------------------- ?Debridement Details ?Patient Name: Date of Service: ?[name], [name] 08/03/2021 10:45 A M ?Medical Record Number: [id number] ?Patient Account Number: [id number] ?Date of Birth/Sex: Treating RN: ?10-Nov-1950 (71 y.o. [name], [name] ?Primary Care Provider: [name] Other Clinician: ?Referring Provider: ?Treating Provider/Extender: [name] ?[name], [name] ?Weeks in Treatment: 15 ?Debridement Performed for Assessment: Wound #2 Right Trochanter ?Performed By: Physician [name], DO ?Debridement Type: Debridement ?Level of Consciousness (Pre-procedure): Awake and Alert ?Pre-procedure Verification/Time Out Yes - 11:30 ?Taken: ?Start Time: 11:30 ?Pain Control: Lidocaine ?T Area Debrided (L x W): ?otal 0.1 (cm) x 0.1 (cm) = 0.01 (cm?) ?Tissue and other material debrided: ?Viable, Non-Viable, Slough, Subcutaneous, Skin: Dermis , Skin: Epidermis, Slough ?Level: Skin/Subcutaneous Tissue ?Debridement Description: Excisional ?Instrument: Curette ?Bleeding: Minimum ?Hemostasis Achieved: Pressure ?End Time: 11:30 ?Procedural Pain: 0 ?Post Procedural Pain: 0 ?Response to Treatment: Procedure was tolerated well ?Level  of Consciousness (Post- Awake and Alert ?procedure): ?Post Debridement Measurements of Total Wound ?Length: (cm) 0.5 ?Width: (cm) 0.5 ?Depth: (cm) 0.1 ?Volume: (cm?) 0.02 ?Character of Wound/Ulcer Post Debridement: Improved ?Post Procedure Diagnosis ?Same as Pre-procedure ?Electronic Signature(s) ?Signed: 08/03/2021 12:53:05 PM By: [name] DO ?Signed: 08/03/2021 4:47:11 PM By: Rhae Hammock RN ?Entered By: Rhae Hammock on 08/03/2021 11:35:33 ?-------------------------------------------------------------------------------- ?HPI Details ?Patient Name: Date of Service: ?[name], [name] 08/03/2021 10:45 A M ?Medical Record Number: [id number] ?Patient Account Number: [id number] ?Date of Birth/Sex: Treating RN: ?[birth year] (71 y.o. [name], [name] ?Primary Care Provider: [name] Other Clinician: ?Referring Provider: ?Treating Provider/Extender: [name] ?[name], [name] ?Weeks in Treatment: 15 ?History of Present Illness ?HPI Description: Admission 04/19/2021 ?Mr. Kristjan Derner is a 71 year old male with a past medical history of end-stage renal disease on hemodialysis, Substance abuse and hep C that presents to the ?clinic for multiple pressure ulcers to his bottom, sacrum and right knee. His daughter is present and helps provide the history. [place] ?[place] fell and developed pressure injuries. He declined going to a skilled nursing facility and had not been properly managing the wound beds. He ended up being ?hospitalized on 12/27/2020 for sepsis secondary to wound infections. He did require intubation for respiratory support. Since then he has moved to live with his ?daughter who helps take care of the wounds. She has been using calcium alginate to the wound beds. He currently denies signs of infection. ?2/13; patient presents for follow-up. He has been using Hydrofera Blue to the right hip wound and right knee wound and Dakin's to the sacral and gluteus ?wound. He  denies signs of infection. He has a hard time offloading the wound beds however does try to do so. Please no issues or complaints today. ?3/16; patient presents for follow-up. He has been using Hydrofera Blue to the right hip wound and right knee. He reports that the right knee wound is healed. He ?continues Dakin's to the sacral and gluteus wound. He obtained  his sacral x-ray. He has no issues or complaints today. He denies signs of infection. ?4/19; patient presents for follow-up. He has been using Hydrofera Blue to the right hip wound and Dakin's wet-to-dry to the sacral and gluteus wound. The ?gluteus wound has healed. He has no issues or complaints today. He reports improvement in wound healing. He denies signs of infection. ?5/2; patient presents for follow-up. He has been using Hydrofera Blue to the right hip wound and Dakin's wet-to-dry dressings to the sacral wound. He has no ?issues or complaints today. He reports continued improvement in wound healing. ?5/16; patient presents for follow-up. Patient has been using Hydrofera Blue to the sacral wound and the right hip wound. He reports improvement in wound ?healing. He has no issues or complaints today. ?Electronic Signature(s) ?Signed: 08/03/2021 12:53:05 PM By: [name] DO ?Entered By: [name] on 08/03/2021 12:35:25 ?-------------------------------------------------------------------------------- ?Physical Exam Details ?Patient Name: Date of Service: ?[name], [name] 08/03/2021 10:45 A M ?Medical Record Number: [id number] ?Patient Account Number: [id number] ?Date of Birth/Sex: Treating RN: ?01-16-1951 (71 y.o. [name], [name] ?Primary Care Provider: [name] Other Clinician: ?Referring Provider: ?Treating Provider/Extender: [name] ?[name], [name] ?Weeks in Treatment: 15 ?Constitutional ?respirations regular, non-labored and within target range for patient.Marland Kitchen ?Psychiatric ?pleasant and cooperative. ?Notes ?There is  epithelization to the previous sacral wound. The right hip wound has nonviable tissue that was debrided and has a small open wound with granulation ?tissue present. No signs of surrounding infection. ?Electronic Signature(s) ?Signed: 08/03/2021 12:53:05 PM By: [name] DO ?Entered By: [name] on 08/03/2021 12:36:23 ?-------------------------------------------------------------------------------- ?Physician Orders Details ?Patient Name: ?Date of Service: ?[name], [name] 08/03/2021 10:45 A M ?Medical Record Number: [id number] ?Patient Account Number: [id number] ?Date of Birth/Sex: ?Treating RN: ?June 07, 1950 (71 y.o. [name], [name] ?Primary Care Provider: [name] ?Other Clinician: ?Referring Provider: ?Treating Provider/Extender: [name] ?[name], [name] ?Weeks in Treatment: 15 ?Verbal / Phone Orders: No ?Diagnosis Coding ?Follow-up Appointments ?ppointment in 2 weeks. - w/ Dr. Heber [place] and Allayne Butcher ?Return A ?Bathing/ Shower/ Hygiene ?May shower with protection but do not get wound dressing(s) wet. ?Off-Loading ?Low air-loss mattress (Group 2) ?Turn and reposition every 2 hours ?Wound Treatment ?Wound #2 - Trochanter Wound Laterality: Right ?Cleanser: Wound Cleanser (Generic) 1 x Per Day/15 Days ?Discharge Instructions: Cleanse the wound with wound cleanser prior to applying a clean dressing using gauze sponges, not tissue or cotton balls. ?Cleanser: Byram Ancillary Kit - 15 Day Supply (Generic) 1 x Per Day/15 Days ?Discharge Instructions: Use supplies as instructed; Kit contains: (15) Saline Bullets; (15) 3x3 Gauze; 15 pr Gloves ?Prim Dressing: Hydrofera Blue Classic Foam, 4x4 in (Generic) 1 x Per Day/15 Days ?ary ?Discharge Instructions: Moisten with saline prior to applying to wound bed ?Secondary Dressing: Zetuvit Plus Silicone Border Dressing 7x7(in/in) (Generic) 1 x Per Day/15 Days ?Discharge Instructions: Apply silicone border over primary dressing as directed. ?Secured With:  53M Medipore H Soft Cloth Surgical T ape, 4 x 10 (in/yd) (Generic) 1 x Per Day/15 Days ?Discharge Instructions: Secure with tape as directed. ?Electronic Signature(s) ?Signed: 08/03/2021 12:53:05 PM By: Silverio Decamp

## 2021-08-10 ENCOUNTER — Encounter (HOSPITAL_BASED_OUTPATIENT_CLINIC_OR_DEPARTMENT_OTHER): Payer: Medicare (Managed Care) | Admitting: Internal Medicine

## 2021-08-10 DIAGNOSIS — S71001D Unspecified open wound, right hip, subsequent encounter: Secondary | ICD-10-CM | POA: Diagnosis not present

## 2021-08-10 DIAGNOSIS — L89313 Pressure ulcer of right buttock, stage 3: Secondary | ICD-10-CM

## 2021-08-10 DIAGNOSIS — L89153 Pressure ulcer of sacral region, stage 3: Secondary | ICD-10-CM | POA: Diagnosis not present

## 2021-08-10 DIAGNOSIS — L89892 Pressure ulcer of other site, stage 2: Secondary | ICD-10-CM

## 2021-08-10 NOTE — Progress Notes (Signed)
[name] ([id number]) Visit Report for 08/10/2021 Chief Complaint Document Details Patient Name: Date of Service: [name] ID 08/10/2021 10:45 A M Medical Record Number: [id number] Patient Account Number: [id number] Date of Birth/Sex: Treating RN: December 05, 1950 (70 y.o. [name], [name] Primary Care Provider: [name] Other Clinician: Referring Provider: Treating Provider/Extender: Roxy Manns in Treatment: 16 Information Obtained from: Patient Chief Complaint Multiple pressure ulcers to the sacrum, hip, buttocks and knee Electronic Signature(s) Signed: 08/10/2021 12:59:51 PM By: [name] DO Entered By: [name] on 08/10/2021 12:54:34 -------------------------------------------------------------------------------- HPI Details Patient Name: Date of Service: [name], [name] ID 08/10/2021 10:45 A M Medical Record Number: [id number] Patient Account Number: [id number] Date of Birth/Sex: Treating RN: Jun 05, 1950 (71 y.o. Erie Noe Primary Care Provider: [name] Other Clinician: Referring Provider: Treating Provider/Extender: Roxy Manns in Treatment: 16 History of Present Illness HPI Description: Admission 04/19/2021 [name] is a 71 year old male with a past medical history of end-stage renal disease on hemodialysis, Substance abuse and hep C that presents to the clinic for multiple pressure ulcers to his bottom, sacrum and right knee. His daughter is present and helps provide the history. [place] and developed pressure injuries. He declined going to a skilled nursing facility and had not been properly managing the wound beds. He ended up being hospitalized on 12/27/2020 for sepsis secondary to wound infections. He did require intubation for respiratory support. Since then he has moved to live with his daughter who helps take care of the wounds. She  has been using calcium alginate to the wound beds. He currently denies signs of infection. 2/13; patient presents for follow-up. He has been using Hydrofera Blue to the right hip wound and right knee wound and Dakin's to the sacral and gluteus wound. He denies signs of infection. He has a hard time offloading the wound beds however does try to do so. Please no issues or complaints today. 3/16; patient presents for follow-up. He has been using Hydrofera Blue to the right hip wound and right knee. He reports that the right knee wound is healed. He continues Dakin's to the sacral and gluteus wound. He obtained his sacral x-ray. He has no issues or complaints today. He denies signs of infection. 4/19; patient presents for follow-up. He has been using Hydrofera Blue to the right hip wound and Dakin's wet-to-dry to the sacral and gluteus wound. The gluteus wound has healed. He has no issues or complaints today. He reports improvement in wound healing. He denies signs of infection. 5/2; patient presents for follow-up. He has been using Hydrofera Blue to the right hip wound and Dakin's wet-to-dry dressings to the sacral wound. He has no issues or complaints today. He reports continued improvement in wound healing. 5/16; patient presents for follow-up. Patient has been using Hydrofera Blue to the sacral wound and the right hip wound. He reports improvement in wound healing. He has no issues or complaints today. 5/23; patient presents for follow-up. He has been using Hydrofera Blue to the right hip wound. He reports improvement in wound healing. Electronic Signature(s) Signed: 08/10/2021 12:59:51 PM By: [name] DO Entered By: [name] on 08/10/2021 12:55:05 -------------------------------------------------------------------------------- Physical Exam Details Patient Name: Date of Service: [name] ID 08/10/2021 10:45 A M Medical Record Number: [id number] Patient Account Number:  [id number] Date of Birth/Sex: Treating RN: 1950-12-29 (71 y.o. [name], [name] Primary Care Provider: [name]  Other Clinician: Referring Provider: Treating Provider/Extender: Roxy Manns in Treatment: 16 Constitutional respirations regular, non-labored and within target range for patient.Marland Kitchen Psychiatric pleasant and cooperative. Notes There is epithelization to the previous right hip wound. Surrounding skin is intact. Electronic Signature(s) Signed: 08/10/2021 12:59:51 PM By: [name] DO Entered By: [name] on 08/10/2021 12:55:58 -------------------------------------------------------------------------------- Physician Orders Details Patient Name: Date of Service: [name], [name] ID 08/10/2021 10:45 A M Medical Record Number: [id number] Patient Account Number: [id number] Date of Birth/Sex: Treating RN: 1950-04-02 (71 y.o. [name], [name] Primary Care Provider: [name] Other Clinician: Referring Provider: Treating Provider/Extender: Roxy Manns in Treatment: 16 Verbal / Phone Orders: No Diagnosis Coding Discharge From [facility] Discharge from [place] Signature(s) Signed: 08/10/2021 12:59:51 PM By: [name] DO Entered By: [name] on 08/10/2021 12:56:04 -------------------------------------------------------------------------------- Problem List Details Patient Name: Date of Service: [name], [name] ID 08/10/2021 10:45 A M Medical Record Number: [id number] Patient Account Number: [id number] Date of Birth/Sex: Treating RN: [birth year] (71 y.o. [name], [name] Primary Care Provider: [name] Other Clinician: Referring Provider: Treating Provider/Extender: Roxy Manns in Treatment: 16 Active Problems ICD-10 Encounter Code Description Active Date MDM Diagnosis L89.153 Pressure ulcer of sacral region,  stage 3 04/19/2021 No Yes L89.313 Pressure ulcer of right buttock, stage 3 04/19/2021 No Yes L89.892 Pressure ulcer of other site, stage 2 04/19/2021 No Yes N18.6 End stage renal disease 04/19/2021 No Yes Inactive Problems Resolved Problems Electronic Signature(s) Signed: 08/10/2021 12:59:51 PM By: [name] DO Entered By: [name] on 08/10/2021 12:54:12 -------------------------------------------------------------------------------- Progress Note Details Patient Name: Date of Service: [name], [name] ID 08/10/2021 10:45 A M Medical Record Number: [id number] Patient Account Number: [id number] Date of Birth/Sex: Treating RN: 16-Nov-1950 (71 y.o. Erie Noe Primary Care Provider: [name] Other Clinician: Referring Provider: Treating Provider/Extender: Roxy Manns in Treatment: 16 Subjective Chief Complaint Information obtained from Patient Multiple pressure ulcers to the sacrum, hip, buttocks and knee History of Present Illness (HPI) Admission 04/19/2021 [name] is a 71 year old male with a past medical history of end-stage renal disease on hemodialysis, Substance abuse and hep C that presents to the clinic for multiple pressure ulcers to his bottom, sacrum and right knee. His daughter is present and helps provide the history. He was living out of state when he fell and developed pressure injuries. He declined going to a skilled nursing facility and had not been properly managing the wound beds. He ended up being hospitalized on 12/27/2020 for sepsis secondary to wound infections. He did require intubation for respiratory support. Since then he has moved to live with his daughter who helps take care of the wounds. She has been using calcium alginate to the wound beds. He currently denies signs of infection. 2/13; patient presents for follow-up. He has been using Hydrofera Blue to the right hip wound and right knee wound and  Dakin's to the sacral and gluteus wound. He denies signs of infection. He has a hard time offloading the wound beds however does try to do so. Please no issues or complaints today. 3/16; patient presents for follow-up. He has been using Hydrofera Blue to the right hip wound and right knee. He reports that the right knee wound is healed. He continues Dakin's to the sacral and gluteus wound. He obtained his sacral x-ray. He has no issues or complaints today. He denies signs of infection. 4/19; patient presents  for follow-up. He has been using Hydrofera Blue to the right hip wound and Dakin's wet-to-dry to the sacral and gluteus wound. The gluteus wound has healed. He has no issues or complaints today. He reports improvement in wound healing. He denies signs of infection. 5/2; patient presents for follow-up. He has been using Hydrofera Blue to the right hip wound and Dakin's wet-to-dry dressings to the sacral wound. He has no issues or complaints today. He reports continued improvement in wound healing. 5/16; patient presents for follow-up. Patient has been using Hydrofera Blue to the sacral wound and the right hip wound. He reports improvement in wound healing. He has no issues or complaints today. 5/23; patient presents for follow-up. He has been using Hydrofera Blue to the right hip wound. He reports improvement in wound healing. Patient History Information obtained from Patient. Family History Unknown History. Social History Former smoker - Quit Nov 2022, Alcohol Use - Never, Drug Use - Prior History - Marijuana, Caffeine Use - Never. Medical History Cardiovascular Patient has history of Arrhythmia - A.Fib Denies history of Angina, Congestive Heart Failure, Coronary Artery Disease, Deep Vein Thrombosis, Hypertension, Hypotension, Myocardial Infarction, Peripheral Arterial Disease, Peripheral Venous Disease, Phlebitis, Vasculitis Genitourinary Denies history of End Stage Renal  Disease Integumentary (Skin) Denies history of History of Burn Hospitalization/Surgery History - Right hip Repair (most recent Nov.2022). Medical A Surgical History Notes nd Respiratory Pt. was on the ventilator-wears oxygen as needed Genitourinary Pt. is on Dialysis Tues,Thurs,Sat Objective Constitutional respirations regular, non-labored and within target range for patient.. Vitals Time Taken: 10:46 AM, Height: 73 in, Weight: 147 lbs, BMI: 19.4, Temperature: 98.2 F, Pulse: 79 bpm, Respiratory Rate: 18 breaths/min, Blood Pressure: 108/75 mmHg. Psychiatric pleasant and cooperative. General Notes: There is epithelization to the previous right hip wound. Surrounding skin is intact. Integumentary (Hair, Skin) Wound #2 status is Healed - Epithelialized. Original cause of wound was Pressure Injury. The date acquired was: 01/26/2021. The wound has been in treatment 16 weeks. The wound is located on the Right Trochanter. The wound measures 0cm length x 0cm width x 0cm depth; 0cm^2 area and 0cm^3 volume. There is Fat Layer (Subcutaneous Tissue) exposed. There is no tunneling or undermining noted. There is a medium amount of serosanguineous drainage noted. The wound margin is distinct with the outline attached to the wound base. There is small (1-33%) red, pink granulation within the wound bed. There is a large (67-100%) amount of necrotic tissue within the wound bed. Assessment Active Problems ICD-10 Pressure ulcer of sacral region, stage 3 Pressure ulcer of right buttock, stage 3 Pressure ulcer of other site, stage 2 End stage renal disease Patient has done well with Hydrofera Blue. His wound has healed. I recommended Vaseline nightly. Follow-up as needed. Plan Discharge From [facility] Services: Discharge from [place] 1. Discharge from clinic due to closed wound 2. Vaseline nightly-Previous right hip wound site 3. Follow-up as needed Electronic Signature(s) Signed: 08/10/2021  12:59:51 PM By: [name] DO Entered By: [name] on 08/10/2021 12:59:04 -------------------------------------------------------------------------------- HxROS Details Patient Name: Date of Service: [name], [name] ID 08/10/2021 10:45 A M Medical Record Number: [id number] Patient Account Number: [id number] Date of Birth/Sex: Treating RN: 1950-06-02 (71 y.o. Erie Noe Primary Care Provider: [name] Other Clinician: Referring Provider: Treating Provider/Extender: Roxy Manns in Treatment: 16 Information Obtained From Patient Respiratory Medical History: Past Medical History Notes: Pt. was on the ventilator-wears oxygen as needed Cardiovascular Medical History: Positive for: Arrhythmia - A.Fib  Negative for: Angina; Congestive Heart Failure; Coronary Artery Disease; Deep Vein Thrombosis; Hypertension; Hypotension; Myocardial Infarction; Peripheral Arterial Disease; Peripheral Venous Disease; Phlebitis; Vasculitis Genitourinary Medical History: Negative for: End Stage Renal Disease Past Medical History Notes: Pt. is on Dialysis Tues,Thurs,Sat Integumentary (Skin) Medical History: Negative for: History of Burn Immunizations Pneumococcal Vaccine: Received Pneumococcal Vaccination: No Implantable Devices Yes Hospitalization / Surgery History Type of Hospitalization/Surgery Right hip Repair (most recent Nov.2022) Family and Social History Unknown History: Yes; Former smoker - Quit Nov 2022; Alcohol Use: Never; Drug Use: Prior History - Marijuana; Caffeine Use: Never; Financial Concerns: No; Food, Clothing or Shelter Needs: No; Support System Lacking: No; Transportation Concerns: No Electronic Signature(s) Signed: 08/10/2021 12:59:51 PM By: [name] DO Signed: 08/10/2021 4:06:07 PM By: Rhae Hammock RN Entered By: [name] on 08/10/2021  12:55:09 -------------------------------------------------------------------------------- SuperBill Details Patient Name: Date of Service: [name], [name] ID 08/10/2021 Medical Record Number: [id number] Patient Account Number: [id number] Date of Birth/Sex: Treating RN: 02-02-1951 (71 y.o. [name], [name] Primary Care Provider: [name] Other Clinician: Referring Provider: Treating Provider/Extender: Roxy Manns in Treatment: 16 Diagnosis Coding ICD-10 Codes Code Description [phone] Pressure ulcer of sacral region, stage 3 L89.313 Pressure ulcer of right buttock, stage 3 L89.892 Pressure ulcer of other site, stage 2 N18.6 End stage renal disease S71.001D Unspecified open wound, right hip, subsequent encounter Facility Procedures CPT4 Code: [id number] Description: 99213 - WOUND CARE VISIT-LEV 3 EST PT Modifier: Quantity: 1 Physician Procedures : CPT4 Code Description Modifier [id number] 99213 - WC PHYS LEVEL 3 - EST PT ICD-10 Diagnosis Description S71.001D Unspecified open wound, right hip, subsequent encounter L89.153 Pressure ulcer of sacral region, stage 3 L89.313 Pressure ulcer of right  buttock, stage 3 L89.892 Pressure ulcer of other site, stage 2 Quantity: 1 Electronic Signature(s) Signed: 08/10/2021 12:59:51 PM By: [name] DO Entered By: [name] on 08/10/2021 12:59:29

## 2021-08-11 ENCOUNTER — Other Ambulatory Visit: Payer: Self-pay

## 2021-08-11 DIAGNOSIS — N186 End stage renal disease: Secondary | ICD-10-CM

## 2021-08-19 ENCOUNTER — Encounter: Payer: Self-pay | Admitting: Vascular Surgery

## 2021-08-19 ENCOUNTER — Ambulatory Visit (HOSPITAL_COMMUNITY)
Admission: RE | Admit: 2021-08-19 | Discharge: 2021-08-19 | Disposition: A | Discharge status: Home / Self Care | Payer: Medicare (Managed Care) | Source: Ambulatory Visit | Attending: Vascular Surgery | Admitting: Vascular Surgery

## 2021-08-19 ENCOUNTER — Ambulatory Visit (INDEPENDENT_AMBULATORY_CARE_PROVIDER_SITE_OTHER): Payer: Medicare (Managed Care) | Admitting: Vascular Surgery

## 2021-08-19 VITALS — BP 115/79 | HR 61 | Temp 98.0°F | Resp 20 | Ht 73.0 in | Wt 155.0 lb

## 2021-08-19 DIAGNOSIS — N186 End stage renal disease: Secondary | ICD-10-CM

## 2021-08-19 DIAGNOSIS — Z992 Dependence on renal dialysis: Secondary | ICD-10-CM

## 2021-08-19 NOTE — Progress Notes (Signed)
Patient name: [name] MRN: [id number] DOB: [birth year] Sex: male  REASON FOR VISIT:   Follow-up after left radiocephalic fistula  HPI:   [name] is a pleasant 71 y.o. male who had presented for new access.  He had a left upper arm graft placed in [place] which had occluded.  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  He presented for new access in the left arm.  On 06/29/2021 he underwent a left radiocephalic AV fistula.  He comes in for his 6-week follow-up visit.  He has some mild pain at the wrist near his incision.  He has a catheter that is working well for dialysis.  He dialyzes on Monday Wednesdays and Fridays.  Current Outpatient Medications  Medication Sig Dispense Refill   apixaban (ELIQUIS) 5 MG TABS tablet Take 1 tablet (5 mg total) by mouth 2 (two) times daily. 60 tablet 11   AURYXIA 1 GM 210 MG(Fe) tablet Take 420 mg by mouth 3 (three) times daily with meals.     calcitRIOL (ROCALTROL) 0.25 MCG capsule Take 5 capsules (1.25 mcg total) by mouth every Monday, Wednesday, and Friday with hemodialysis. 12 capsule 0   carvedilol (COREG) 6.25 MG tablet Take 1 tablet (6.25 mg total) by mouth 2 (two) times daily. 30 tablet 0   HYDROcodone-acetaminophen (NORCO) 10-325 MG tablet Take 1 tablet by mouth 2 (two) times daily as needed for moderate pain.     oxyCODONE-acetaminophen (PERCOCET) 5-325 MG tablet Take 1 tablet by mouth every 4 (four) hours as needed for severe pain. 20 tablet 0   oxyCODONE-acetaminophen (PERCOCET/ROXICET) 5-325 MG tablet Take 1-2 tablets by mouth every 4 (four) hours as needed for severe pain. 15 tablet 0   oxyCODONE-acetaminophen (PERCOCET/ROXICET) 5-325 MG tablet Take 1 tablet by mouth every 4 (four) hours as needed for severe pain. 30 tablet 0   vitamin C (ASCORBIC ACID) 500 MG tablet Take 500 mg by mouth daily.     zinc sulfate 220 (50 Zn) MG capsule Take 220 mg by mouth daily.     No current facility-administered medications for  this visit.    REVIEW OF SYSTEMS:  [X]  denotes positive finding, [ ]  denotes negative finding Vascular    Leg swelling    Cardiac    Chest pain or chest pressure:    Shortness of breath upon exertion:    Short of breath when lying flat:    Irregular heart rhythm:    Constitutional    Fever or chills:     PHYSICAL EXAM:   Vitals:   08/19/21 1244  BP: 115/79  Pulse: 61  Resp: 20  Temp: 98 F (36.7 C)  SpO2: 96%  Weight: 155 lb (70.3 kg)  Height: 6\' 1"  (1.854 m)    GENERAL: The patient is a well-nourished male, in no acute distress. The vital signs are documented above. CARDIOVASCULAR: There is a regular rate and rhythm. PULMONARY: There is good air exchange bilaterally without wheezing or rales. VASCULAR: On exam the fistula looks reasonable in size but is pulsatile.  It also appears to have narrowing in the proximal fistula at the wrist.  DATA:   DUPLEX AV FISTULA: I have independently interpreted the duplex of his AV fistula.  The fistula is patent with increased velocities and narrowing in the mid and proximal forearm.  The diameters of the vein ranged from 2-3.8 mm.  MEDICAL ISSUES:   END-STAGE RENAL DISEASE: The patient has a left radiocephalic fistula that  was placed on 06/29/2021.  Based on his duplex he has some areas of narrowing in the proximal forearm and mid forearm.  The vein is not adequate in size.  I have recommended that we proceed with a fistulogram and possible venoplasty of the areas of stenosis.  It may be best to cannulate the fistula in the proximal forearm to allow access to the narrowing in the mid and proximal fistula.  He dialyzes on Monday Wednesdays and Fridays and we will try to schedule this on a Tuesday or Thursday.  If this fistula is not salvageable the next consideration might be a basilic vein transposition on the left.  Of note he had a previous left upper arm graft in [place].  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  We will make further recommendations pending the results of his fistulogram.  [name] [facility]

## 2021-08-26 ENCOUNTER — Other Ambulatory Visit: Payer: Self-pay

## 2021-08-26 DIAGNOSIS — N186 End stage renal disease: Secondary | ICD-10-CM

## 2021-08-26 MED ORDER — SODIUM CHLORIDE 0.9% FLUSH: 3.0000 mL | INTRAVENOUS | Status: AC | PRN

## 2021-08-26 MED ORDER — SODIUM CHLORIDE 0.9 % IV SOLN: 250.0000 mL | INTRAVENOUS | Status: AC | PRN

## 2021-08-26 MED ORDER — SODIUM CHLORIDE 0.9% FLUSH: 3.0000 mL | Freq: Two times a day (BID) | INTRAVENOUS | Status: AC

## 2021-08-30 NOTE — Therapy (Signed)
OUTPATIENT PHYSICAL THERAPY EVALUATION   Patient Name: [name] MRN: [id number] DOB:[birth year], 71 y.o., male Today's Date: 08/31/2021   PT End of Session - 08/31/21 1317     Visit Number 1    Number of Visits 12    Date for PT Re-Evaluation 10/26/21    Authorization Type [facility] MEDICARE    Progress Note Due on Visit 10    PT Start Time 0924    PT Stop Time 1000    PT Time Calculation (min) 36 min    Equipment Utilized During Treatment Gait belt    Activity Tolerance Patient tolerated treatment well    Behavior During Therapy WFL for tasks assessed/performed             Past Medical History:  Diagnosis Date   A-fib ([place])    A-fib ([place])    Edema    ESRD (end stage renal disease) ([place])    MWF Emilie Rutter   History of insertion of tunneled central venous catheter (CVC) with port    Hypotension    Past Surgical History:  Procedure Laterality Date   AV FISTULA PLACEMENT Left 06/29/2021   Procedure: LEFT RADIOCEPHALIC ARTERIOVENOUS (AV) FISTULA CREATION;  Surgeon: Angelia Mould, MD;  Location: [place];  Service: Vascular;  Laterality: Left;   IR FLUORO GUIDE CV LINE RIGHT  06/04/2021   IR REMOVAL TUN CV CATH W/O FL  06/04/2021   IR US GUIDE VASC ACCESS RIGHT  06/04/2021   Patient Active Problem List   Diagnosis Date Noted   Hemodialysis catheter dysfunction ([place]) 06/11/2021   Pressure injury of skin 06/10/2021   Bradycardia 06/09/2021   Paroxysmal atrial fibrillation ([place]) 06/09/2021   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 Prior to the hospital stay he was using walker. Currently he is primarily wheel chair bound for getting around but is able to transfer from the walker to the bed and chair without help. Patient does note pain in his right hip, left shoulder, and right wrist from pushing up to stand. States the left shoulder has been affected because he has a catheter on the left side which was in for too long, which has limited his ability to raise the left arm.  PERTINENT HISTORY: ESRD on hemodialysis, AV fistula placement,   PAIN:  Are you having pain? Yes:  NPRS scale: 5/10 Pain location: right hip, left shoulder, right wrist Pain description: chronic, intermittent, aching Aggravating factors: Standing, pushing from armrest of chair, reaching overhead Relieving factors: Rest  PRECAUTIONS: Fall, patient currently non-ambulatory  WEIGHT BEARING RESTRICTIONS No  FALLS:  Has patient fallen in last 6 months? No  LIVING ENVIRONMENT: Lives with: lives with their daughter Lives in: House/apartment Stairs: Yes: External: 1-2 steps; none Has following equipment at home: Walker - 4 wheeled and Wheelchair (manual)  OCCUPATION: Disability  PLOF: Requires assistive device for independence, Needs assistance with homemaking, and Needs assistance with gait  PATIENT GOALS: Patient states he wants  to be able to stand and walk on his own.   OBJECTIVE:  COGNITION: Overall cognitive status: Within functional limits for tasks assessed     SENSATION: WFL  MUSCLE LENGTH: Significant limitation of hamstring muscle length  POSTURE: rounded shoulders, forward head, and decreased lumbar lordosis  LOWER EXTREMITY ROM: Limitation in bilateral knee extension, lacking approx 35-40 deg bilaterally  LOWER EXTREMITY MMT:  MMT Right eval Left eval  Hip flexion 4- 4-   Hip extension 3- 3-  Hip abduction 3- 3-  Knee flexion 4 4  Knee extension 4 4   FUNCTIONAL TESTS:  Sit to stand: patient requires significant use of BUE for support for sit to stand and controlled of decent  GAIT: Distance walked: N/A Assistive device utilized: N/A Level of assistance: N/A Comments: gait not assessed at evaluation   TODAY'S TREATMENT: Bridge x 15 SLR (lacking full knee extension) x 15 Side clamshell x 20 Seated clamshell with red x 20 Seated alternating march with red x 20 LAQ with red  x 15 Mini squat with BUE support at counter x 10   PATIENT EDUCATION:  Education details: Exam findings, POC, HEP Person educated: Patient Education method: Explanation, Demonstration, Tactile cues, Verbal cues, and Handouts Education comprehension: verbalized understanding, returned demonstration, verbal cues required, tactile cues required, and needs further education  HOME EXERCISE PROGRAM: Access Code: [email or web address]   ASSESSMENT: CLINICAL IMPRESSION: Patient is a 71 y.o. male who was seen today for physical therapy evaluation and treatment for significant LE strength and overall mobility deficits that limit patient to be in wheelchair for mobility. He is able to perform stand pivot transfers but unable to maintain standing without UE support. He demonstrates gross LE strength deficits and knee flexion contracture bilaterally contributing to difficulties with standing and walking.    OBJECTIVE IMPAIRMENTS Abnormal gait, decreased activity tolerance, decreased balance, decreased mobility, difficulty walking, decreased ROM, decreased strength, impaired flexibility, postural dysfunction, and pain.   ACTIVITY LIMITATIONS standing, squatting, stairs, transfers, and locomotion level  PARTICIPATION LIMITATIONS: meal prep, cleaning, laundry, driving, shopping, community activity, occupation, and yard work  PERSONAL FACTORS Age, Fitness, Past/current experiences, Time since onset  of injury/illness/exacerbation, Transportation, and 1 comorbidity: ESRD on hemodialysis  are also affecting patient's functional outcome.   REHAB POTENTIAL: Fair  CLINICAL DECISION MAKING: Evolving/moderate complexity  EVALUATION COMPLEXITY: Moderate   GOALS: Goals reviewed with patient? Yes  SHORT TERM GOALS: Target date: 09/28/2021   Patient will be I with initial HEP in order to progress with therapy. Baseline: HEP provided at eval Goal status: INITIAL  2.  Patient be able to perform sit to stand without UE support to indicate improved strength and functional mobility. Baseline: patient requires use of BUE Goal status: INITIAL  3.  Patient will be able to stand >/= 1 min without UE support in order to improve balance and ability to perform ADLs Baseline: patient unable to stand without UE support Goal status: INITIAL  LONG TERM GOALS: Target date: 10/26/2021   Patient will be I with final HEP to maintain progress from PT. Baseline: HEP provided at eval Goal status: INITIAL  2.  Patient will be able to ambulate at mod I level using RW for 73ft in order to improve independence with household ambulation. Baseline: Patient unable to ambulate Goal status: INITIAL  3.  Patient will exhibit bilateral knee extension lacking </= 20 deg in order to improve gait and standing ability Baseline: lacking approx 35-40 deg Goal status: INITIAL  4.  Patient  will exhibit hip strength >/= 4-/5 MMT and knee strength >/= 4+/5 MMT in order to improve walking and standing tolerance. Baseline: patient exhibits deficits in hip and knee strength Goal status: INITIAL   PLAN: PT FREQUENCY: 1-2x/week  PT DURATION: 8 weeks  PLANNED INTERVENTIONS: Therapeutic exercises, Therapeutic activity, Neuromuscular re-education, Balance training, Gait training, Patient/Family education, Joint mobilization, Stair training, Aquatic Therapy, Cryotherapy, Moist heat, Manual therapy, and Re-evaluation  PLAN FOR  NEXT SESSION: Review HEP and progress PRN, standing and gait training in // bars, gross LE strengthening, knee extension mobility and stretching   Hilda Blades, PT, DPT, LAT, ATC 08/31/21  1:58 PM Phone: [phone] Fax: [phone]

## 2021-08-31 ENCOUNTER — Other Ambulatory Visit: Payer: Self-pay

## 2021-08-31 ENCOUNTER — Ambulatory Visit: Payer: Medicare (Managed Care) | Attending: Nephrology | Admitting: Physical Therapy

## 2021-08-31 ENCOUNTER — Encounter: Payer: Self-pay | Admitting: Physical Therapy

## 2021-08-31 DIAGNOSIS — R2689 Other abnormalities of gait and mobility: Secondary | ICD-10-CM

## 2021-08-31 DIAGNOSIS — M6281 Muscle weakness (generalized): Secondary | ICD-10-CM

## 2021-08-31 NOTE — Patient Instructions (Signed)
Access Code: [facility] URL: [email or web address] Date: 08/31/2021 Prepared by: Hilda Blades  Exercises - Supine Bridge  - 1 x daily - 3 sets - 15 reps - Active Straight Leg Raise with Quad Set  - 1 x daily - 3 sets - 15 reps - Clamshell  - 1 x daily - 3 sets - 20 reps - Seated Hip Abduction with Resistance  - 1 x daily - 3 sets - 20 reps - Seated March with Resistance  - 1 x daily - 3 sets - 20 reps - Seated Knee Extension with Anchored Resistance  - 1 x daily - 3 sets - 15 reps - Mini Squat with Counter Support  - 1 x daily - 3 sets - 10 reps

## 2021-08-31 NOTE — Progress Notes (Signed)
DOYL, BITTING ([id number]) Visit Report for 08/10/2021 Arrival Information Details Patient Name: Date of Service: [name] ID 08/10/2021 10:45 A M Medical Record Number: [id number] Patient Account Number: [id number] Date of Birth/Sex: Treating RN: 1950-09-11 (71 y.o. [name], [name] Primary Care Viney Acocella: [name] Other Clinician: Referring Laretta Pyatt: Treating Virginio Isidore/Extender: Roxy Manns in Treatment: 16 Visit Information History Since Last Visit Added or deleted any medications: No Patient Arrived: Ambulatory Any new allergies or adverse reactions: No Arrival Time: 10:45 Had a fall or experienced change in No Accompanied By: daughter activities of daily living that may affect Transfer Assistance: Manual risk of falls: Patient Identification Verified: Yes Signs or symptoms of abuse/neglect since last visito No Secondary Verification Process Completed: Yes Hospitalized since last visit: No Patient Requires Transmission-Based Precautions: No Implantable device outside of the clinic excluding No Patient Has Alerts: No cellular tissue based products placed in the center since last visit: Pain Present Now: Yes Electronic Signature(s) Signed: 08/31/2021 8:46:54 AM By: Erenest Blank Entered By: Erenest Blank on 08/10/2021 10:46:05 -------------------------------------------------------------------------------- Clinic Level of Care Assessment Details Patient Name: Date of Service: [name] ID 08/10/2021 10:45 A M Medical Record Number: [id number] Patient Account Number: [id number] Date of Birth/Sex: Treating RN: 1950/07/22 (71 y.o. [name], [name] Primary Care Flois Mctague: [name] Other Clinician: Referring Khalee Mazo: Treating Jakai Onofre/Extender: Roxy Manns in Treatment: 16 Clinic Level of Care Assessment Items TOOL 4 Quantity Score X- 1 0 Use when only an EandM is performed on FOLLOW-UP  visit ASSESSMENTS - Nursing Assessment / Reassessment X- 1 10 Reassessment of Co-morbidities (includes updates in patient status) X- 1 5 Reassessment of Adherence to Treatment Plan ASSESSMENTS - Wound and Skin A ssessment / Reassessment X - Simple Wound Assessment / Reassessment - one wound 1 5 []  - 0 Complex Wound Assessment / Reassessment - multiple wounds []  - 0 Dermatologic / Skin Assessment (not related to wound area) ASSESSMENTS - Focused Assessment X- 1 5 Circumferential Edema Measurements - multi extremities []  - 0 Nutritional Assessment / Counseling / Intervention []  - 0 Lower Extremity Assessment (monofilament, tuning fork, pulses) []  - 0 Peripheral Arterial Disease Assessment (using hand held doppler) ASSESSMENTS - Ostomy and/or Continence Assessment and Care []  - 0 Incontinence Assessment and Management []  - 0 Ostomy Care Assessment and Management (repouching, etc.) PROCESS - Coordination of Care X - Simple Patient / Family Education for ongoing care 1 15 []  - 0 Complex (extensive) Patient / Family Education for ongoing care X- 1 10 Staff obtains Programmer, systems, Records, T Results / Process Orders est []  - 0 Staff telephones HHA, Nursing Homes / Clarify orders / etc []  - 0 Routine Transfer to another Facility (non-emergent condition) []  - 0 Routine Hospital Admission (non-emergent condition) []  - 0 New Admissions / Biomedical engineer / Ordering NPWT Apligraf, etc. , []  - 0 Emergency Hospital Admission (emergent condition) X- 1 10 Simple Discharge Coordination []  - 0 Complex (extensive) Discharge Coordination PROCESS - Special Needs []  - 0 Pediatric / Minor Patient Management []  - 0 Isolation Patient Management []  - 0 Hearing / Language / Visual special needs []  - 0 Assessment of Community assistance (transportation, D/C planning, etc.) []  - 0 Additional assistance / Altered mentation []  - 0 Support Surface(s) Assessment (bed, cushion, seat,  etc.) INTERVENTIONS - Wound Cleansing / Measurement X - Simple Wound Cleansing - one wound 1 5 []  - 0 Complex Wound Cleansing - multiple wounds X- 1 5 Wound Imaging (photographs -  any number of wounds) []  - 0 Wound Tracing (instead of photographs) X- 1 5 Simple Wound Measurement - one wound []  - 0 Complex Wound Measurement - multiple wounds INTERVENTIONS - Wound Dressings X - Small Wound Dressing one or multiple wounds 1 10 []  - 0 Medium Wound Dressing one or multiple wounds []  - 0 Large Wound Dressing one or multiple wounds X- 1 5 Application of Medications - topical []  - 0 Application of Medications - injection INTERVENTIONS - Miscellaneous []  - 0 External ear exam []  - 0 Specimen Collection (cultures, biopsies, blood, body fluids, etc.) []  - 0 Specimen(s) / Culture(s) sent or taken to Lab for analysis []  - 0 Patient Transfer (multiple staff / Civil Service fast streamer / Similar devices) []  - 0 Simple Staple / Suture removal (25 or less) []  - 0 Complex Staple / Suture removal (26 or more) []  - 0 Hypo / Hyperglycemic Management (close monitor of Blood Glucose) []  - 0 Ankle / Brachial Index (ABI) - do not check if billed separately X- 1 5 Vital Signs Has the patient been seen at the hospital within the last three years: Yes Total Score: 95 Level Of Care: New/Established - Level 3 Electronic Signature(s) Signed: 08/10/2021 4:06:07 PM By: Rhae Hammock RN Entered By: Rhae Hammock on 08/10/2021 11:29:00 -------------------------------------------------------------------------------- Encounter Discharge Information Details Patient Name: Date of Service: [name], [name] ID 08/10/2021 10:45 A M Medical Record Number: [id number] Patient Account Number: [id number] Date of Birth/Sex: Treating RN: 03/04/1951 (71 y.o. [name], [name] Primary Care Elicia Lui: [name] Other Clinician: Referring Elinda Bunten: Treating Aarionna Germer/Extender: Roxy Manns in Treatment: 16 Encounter Discharge Information Items Discharge Condition: Stable Ambulatory Status: Wheelchair Discharge Destination: Home Transportation: Private Auto Accompanied By: daughter Schedule Follow-up Appointment: Yes Clinical Summary of Care: Patient Declined Electronic Signature(s) Signed: 08/10/2021 4:06:07 PM By: Rhae Hammock RN Entered By: Rhae Hammock on 08/10/2021 11:29:47 -------------------------------------------------------------------------------- Lower Extremity Assessment Details Patient Name: Date of Service: [name] ID 08/10/2021 10:45 A M Medical Record Number: [id number] Patient Account Number: [id number] Date of Birth/Sex: Treating RN: 1951-01-10 (71 y.o. [name] Primary Care Earma Nicolaou: [name] Other Clinician: Referring Bijon Mineer: Treating Faron Whitelock/Extender: Roxy Manns in Treatment: 16 Electronic Signature(s) Signed: 08/10/2021 4:06:07 PM By: Rhae Hammock RN Signed: 08/31/2021 8:46:54 AM By: Erenest Blank Entered By: Erenest Blank on 08/10/2021 10:49:39 -------------------------------------------------------------------------------- Multi Wound Chart Details Patient Name: Date of Service: [name], [name] ID 08/10/2021 10:45 A M Medical Record Number: [id number] Patient Account Number: [id number] Date of Birth/Sex: Treating RN: 04/30/1950 (71 y.o. [name], [name] Primary Care Isaic Syler: [name] Other Clinician: Referring Soyla Bainter: Treating Fedor Kazmierski/Extender: Roxy Manns in Treatment: 16 Vital Signs Height(in): 73 Pulse(bpm): 79 Weight(lbs): 147 Blood Pressure(mmHg): 108/75 Body Mass Index(BMI): 19.4 Temperature(F): 98.2 Respiratory Rate(breaths/min): 18 Photos: [N/A:N/A] Right Trochanter N/A N/A Wound Location: Pressure Injury N/A N/A Wounding Event: Pressure Ulcer N/A N/A Primary Etiology: Arrhythmia N/A  N/A Comorbid History: 01/26/2021 N/A N/A Date Acquired: 16 N/A N/A Weeks of Treatment: Healed - Epithelialized N/A N/A Wound Status: No N/A N/A Wound Recurrence: 0x0x0 N/A N/A Measurements L x W x D (cm) 0 N/A N/A A (cm) : rea 0 N/A N/A Volume (cm) : 100.00% N/A N/A % Reduction in A rea: 100.00% N/A N/A % Reduction in Volume: Category/Stage III N/A N/A Classification: Medium N/A N/A Exudate A mount: Serosanguineous N/A N/A Exudate Type: red, brown N/A N/A Exudate Color: Distinct, outline attached N/A N/A Wound Margin:  Small (1-33%) N/A N/A Granulation A mount: Red, Pink N/A N/A Granulation Quality: Large (67-100%) N/A N/A Necrotic A mount: Fat Layer (Subcutaneous Tissue): Yes N/A N/A Exposed Structures: Fascia: No Tendon: No Muscle: No Joint: No Bone: No Medium (34-66%) N/A N/A Epithelialization: Treatment Notes Wound #2 (Trochanter) Wound Laterality: Right Cleanser Peri-Wound Care Topical Primary Dressing Secondary Dressing Secured With Compression Wrap Compression Stockings Add-Ons Electronic Signature(s) Signed: 08/10/2021 12:59:51 PM By: [name] DO Signed: 08/10/2021 4:06:07 PM By: Rhae Hammock RN Entered By: [name] on 08/10/2021 12:54:27 -------------------------------------------------------------------------------- Multi-Disciplinary Care Plan Details Patient Name: Date of Service: [name], [name] ID 08/10/2021 10:45 A M Medical Record Number: [id number] Patient Account Number: [id number] Date of Birth/Sex: Treating RN: December 12, 1950 (71 y.o. [name], [name] Primary Care Avaiyah Strubel: [name] Other Clinician: Referring Markees Carns: Treating Alonte Wulff/Extender: Roxy Manns in Treatment: [place] reviewed with physician Active Inactive Electronic Signature(s) Signed: 08/12/2021 3:50:23 PM By: Rhae Hammock RN Previous Signature: 08/10/2021 4:06:07 PM Version  By: Rhae Hammock RN Entered By: Rhae Hammock on 08/12/2021 14:17:24 -------------------------------------------------------------------------------- Pain Assessment Details Patient Name: Date of Service: [name] ID 08/10/2021 10:45 A M Medical Record Number: [id number] Patient Account Number: [id number] Date of Birth/Sex: Treating RN: 12-04-1950 (71 y.o. [name] Primary Care Hoang Pettingill: [name] Other Clinician: Referring Nima Bamburg: Treating Vincy Feliz/Extender: Roxy Manns in Treatment: 16 Active Problems Location of Pain Severity and Description of Pain Patient Has Paino Yes Site Locations Rate the pain. Current Pain Level: 8 Pain Management and Medication Current Pain Management: Electronic Signature(s) Signed: 08/10/2021 4:06:07 PM By: Rhae Hammock RN Signed: 08/31/2021 8:46:54 AM By: Erenest Blank Entered By: Erenest Blank on 08/10/2021 10:47:18 -------------------------------------------------------------------------------- Patient/Caregiver Education Details Patient Name: Date of Service: [name] ID 5/23/2023andnbsp10:45 A M Medical Record Number: [id number] Patient Account Number: [id number] Date of Birth/Gender: Treating RN: March 20, 1951 (71 y.o. [name] Primary Care Physician: [name] Other Clinician: Referring Physician: Treating Physician/Extender: Roxy Manns in Treatment: 16 Education Assessment Education Provided To: Patient and Caregiver Education Topics Provided Wound/Skin Impairment: Methods: Explain/Verbal Responses: Reinforcements needed, State content correctly [employer]) Signed: 08/10/2021 4:06:07 PM By: Rhae Hammock RN Entered By: Rhae Hammock on 08/10/2021 11:27:36 -------------------------------------------------------------------------------- Wound Assessment Details Patient Name: Date of  Service: [name] ID 08/10/2021 10:45 A M Medical Record Number: [id number] Patient Account Number: [id number] Date of Birth/Sex: Treating RN: 1950/08/25 (71 y.o. [name], [name] Primary Care Mahamed Zalewski: [name] Other Clinician: Referring Kenneth Lax: Treating Lamontae Ricardo/Extender: Roxy Manns in Treatment: 16 Wound Status Wound Number: 2 Primary Etiology: Pressure Ulcer Wound Location: Right Trochanter Wound Status: Healed - Epithelialized Wounding Event: Pressure Injury Comorbid History: Arrhythmia Date Acquired: 01/26/2021 Weeks Of Treatment: 16 Clustered Wound: No Photos Wound Measurements Length: (cm) Width: (cm) Depth: (cm) Area: (cm) Volume: (cm) 0 % Reduction in Area: 100% 0 % Reduction in Volume: 100% 0 Epithelialization: Medium (34-66%) 0 Tunneling: No 0 Undermining: No Wound Description Classification: Category/Stage III Wound Margin: Distinct, outline attached Exudate Amount: Medium Exudate Type: Serosanguineous Exudate Color: red, brown Foul Odor After Cleansing: No Slough/Fibrino Yes Wound Bed Granulation Amount: Small (1-33%) Exposed Structure Granulation Quality: Red, Pink Fascia Exposed: No Necrotic Amount: Large (67-100%) Fat Layer (Subcutaneous Tissue) Exposed: Yes Tendon Exposed: No Muscle Exposed: No Joint Exposed: No Bone Exposed: No Treatment Notes Wound #2 (Trochanter) Wound Laterality: Right Cleanser Peri-Wound Care Topical Primary Dressing Secondary Dressing Secured With Compression Wrap Compression  Stockings Environmental education officer) Signed: 08/10/2021 4:06:07 PM By: Rhae Hammock RN Entered By: Rhae Hammock on 08/10/2021 11:18:15 -------------------------------------------------------------------------------- Vitals Details Patient Name: Date of Service: [name], [name] ID 08/10/2021 10:45 A M Medical Record Number: [id number] Patient Account Number: [id number] Date of  Birth/Sex: Treating RN: [birth year] (71 y.o. [name], [name] Primary Care Harrol Novello: [name] Other Clinician: Referring Khloei Spiker: Treating Alliya Marcon/Extender: Roxy Manns in Treatment: 16 Vital Signs Time Taken: 10:46 Temperature (F): 98.2 Height (in): 73 Pulse (bpm): 79 Weight (lbs): 147 Respiratory Rate (breaths/min): 18 Body Mass Index (BMI): 19.4 Blood Pressure (mmHg): 108/75 Reference Range: 80 - 120 mg / dl Electronic Signature(s) Signed: 08/31/2021 8:46:54 AM By: Erenest Blank Entered By: Erenest Blank on 08/10/2021 10:46:42

## 2021-09-06 NOTE — Therapy (Signed)
OUTPATIENT PHYSICAL THERAPY TREATMENT NOTE   Patient Name: [name] MRN: [id number] DOB:Oct 12, 1950, 71 y.o., male Today's Date: 09/07/2021  PCP: Merryl Hacker, No   REFERRING PROVIDER: Claudia Desanctis, MD  END OF SESSION:   PT End of Session - 09/07/21 1058     Visit Number 2    Number of Visits 12    Date for PT Re-Evaluation 10/26/21    Authorization Type [facility] MEDICARE    Progress Note Due on Visit 10    PT Start Time 6546    PT Stop Time 1130    PT Time Calculation (min) 39 min    Equipment Utilized During Treatment Gait belt    Activity Tolerance Patient tolerated treatment well    Behavior During Therapy WFL for tasks assessed/performed             Past Medical History:  Diagnosis Date   A-fib ([place])    A-fib ([place])    Edema    ESRD (end stage renal disease) ([place])    MWF Emilie Rutter   History of insertion of tunneled central venous catheter (CVC) with port    Hypotension    Past Surgical History:  Procedure Laterality Date   AV FISTULA PLACEMENT Left 06/29/2021   Procedure: LEFT RADIOCEPHALIC ARTERIOVENOUS (AV) FISTULA CREATION;  Surgeon: Angelia Mould, MD;  Location: [facility] OR;  Service: Vascular;  Laterality: Left;   IR FLUORO GUIDE CV LINE RIGHT  06/04/2021   IR REMOVAL TUN CV CATH W/O FL  06/04/2021   IR US GUIDE VASC ACCESS RIGHT  06/04/2021   Patient Active Problem List   Diagnosis Date Noted   Hemodialysis catheter dysfunction ([place]) 06/11/2021   Pressure injury of skin 06/10/2021   Bradycardia 06/09/2021   Paroxysmal atrial fibrillation ([place]) 06/09/2021   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 ([place]) 50/35/4656   Complication of vascular dialysis catheter, initial encounter    Hyperkalemia     REFERRING DIAG: Mobility - indication deconditioning in ESRD patient  THERAPY DIAG:  Muscle weakness (generalized)  Other abnormalities of gait and mobility  Rationale for Evaluation and Treatment Rehabilitation  PERTINENT HISTORY: ESRD on hemodialysis, AV fistula  placement,  PRECAUTIONS: Fall, patient currently non-ambulatory  SUBJECTIVE: Patient reports he is doing well, no new issues. He has trouble doing the bridge exercise on his air mattress.  PAIN:  Are you having pain? Yes:  NPRS scale: 5/10 Pain location: right hip, left shoulder, right wrist Pain description: chronic, intermittent, aching Aggravating factors: Standing, pushing from armrest of chair, reaching overhead Relieving factors: Rest  PATIENT GOALS: Patient states he wants to be able to stand and walk on his own.   OBJECTIVE: (objective measures completed at initial evaluation unless otherwise dated) MUSCLE LENGTH: Significant limitation of hamstring muscle length   POSTURE: rounded shoulders, forward head, and decreased lumbar lordosis   LOWER EXTREMITY ROM: Limitation in bilateral knee extension, lacking approx 35-40 deg bilaterally   LOWER EXTREMITY MMT:   MMT Right eval Left eval  Hip flexion 4- 4-  Hip extension 3- 3-  Hip abduction 3- 3-  Knee flexion 4 4  Knee extension 4 4    FUNCTIONAL TESTS:  Sit to stand: patient requires significant use of BUE for support for sit to stand and controlled of decent   GAIT: Distance walked: 65 ft - 09/07/2021 Assistive device utilized: RW Level of assistance: CGA Comments: knee flexion contracture, early toe off more on left, heavy reliance on UE for support     TODAY'S  TREATMENT: [facility] Adult PT Treatment:                                                DATE: 09/07/2021 Gait Training: Walking in // bars 3 x 2 lengths Walking with RW x 65 ft with GCA Therapeutic Exercise: Squat in // bars with BUE support 2 x 10 Heel raises 2 x 20 LAQ with 3# 2 x 10 each Hookling clamshell with green 2 x 10 SLR 2 x 10 (patient with significant knee flexion contracture) NuStep L6 x 5 min with UE/LE to improve strength, endurance, workload capacity   [facility] Adult PT Treatment:                                                DATE:  08/31/2021 Therapeutic Exercise: Bridge x 15 SLR (lacking full knee extension) x 15 Side clamshell x 20 Seated clamshell with red x 20 Seated alternating march with red x 20 LAQ with red  x 15 Mini squat with BUE support at counter x 10   PATIENT EDUCATION:  Education details: HEP Person educated: Patient Education method: Consulting civil engineer, Demonstration, Corporate treasurer cues, Verbal cues Education comprehension: verbalized understanding, returned demonstration, verbal cues required, tactile cues required, and needs further education   HOME EXERCISE PROGRAM: Access Code: [email or web address]     ASSESSMENT: CLINICAL IMPRESSION: Patient tolerated therapy well with no adverse effects. Initiated gait training in // bars and using RW with CGA for safety. Patient continues to exhibit knee flexion contracture with greater difficulty weight bearing through LLE due to knee pain. Therapy continued progression of LE strengthening with good tolerance. No changes to HEP this visit. Patient would benefit from continued skilled PT to progress strength and mobility in order to improving walking and maximize functional ability.     OBJECTIVE IMPAIRMENTS Abnormal gait, decreased activity tolerance, decreased balance, decreased mobility, difficulty walking, decreased ROM, decreased strength, impaired flexibility, postural dysfunction, and pain.    ACTIVITY LIMITATIONS standing, squatting, stairs, transfers, and locomotion level   PARTICIPATION LIMITATIONS: meal prep, cleaning, laundry, driving, shopping, community activity, occupation, and yard work   PERSONAL FACTORS Age, Fitness, Past/current experiences, Time since onset of injury/illness/exacerbation, Transportation, and 1 comorbidity: ESRD on hemodialysis  are also affecting patient's functional outcome.      GOALS: Goals reviewed with patient? Yes   SHORT TERM GOALS: Target date: 09/28/2021    Patient will be I with initial HEP in order to progress with  therapy. Baseline: HEP provided at eval Goal status: INITIAL   2.  Patient be able to perform sit to stand without UE support to indicate improved strength and functional mobility. Baseline: patient requires use of BUE Goal status: INITIAL   3.  Patient will be able to stand >/= 1 min without UE support in order to improve balance and ability to perform ADLs Baseline: patient unable to stand without UE support Goal status: INITIAL   LONG TERM GOALS: Target date: 10/26/2021    Patient will be I with final HEP to maintain progress from PT. Baseline: HEP provided at eval Goal status: INITIAL   2.  Patient will be able to ambulate at mod I level using RW for 51ft in order to improve independence with  household ambulation. Baseline: Patient unable to ambulate Goal status: INITIAL   3.  Patient will exhibit bilateral knee extension lacking </= 20 deg in order to improve gait and standing ability Baseline: lacking approx 35-40 deg Goal status: INITIAL   4.  Patient will exhibit hip strength >/= 4-/5 MMT and knee strength >/= 4+/5 MMT in order to improve walking and standing tolerance. Baseline: patient exhibits deficits in hip and knee strength Goal status: INITIAL     PLAN: PT FREQUENCY: 1-2x/week   PT DURATION: 8 weeks   PLANNED INTERVENTIONS: Therapeutic exercises, Therapeutic activity, Neuromuscular re-education, Balance training, Gait training, Patient/Family education, Joint mobilization, Stair training, Aquatic Therapy, Cryotherapy, Moist heat, Manual therapy, and Re-evaluation   PLAN FOR NEXT SESSION: Review HEP and progress PRN, standing and gait training in // bars, gross LE strengthening, knee extension mobility and stretching    Hilda Blades, PT, DPT, LAT, ATC 09/07/21  11:32 AM Phone: [phone] Fax: [phone]

## 2021-09-07 ENCOUNTER — Other Ambulatory Visit: Payer: Self-pay

## 2021-09-07 ENCOUNTER — Encounter: Payer: Self-pay | Admitting: Physical Therapy

## 2021-09-07 ENCOUNTER — Ambulatory Visit: Payer: Medicare (Managed Care) | Admitting: Physical Therapy

## 2021-09-07 DIAGNOSIS — M6281 Muscle weakness (generalized): Secondary | ICD-10-CM | POA: Diagnosis not present

## 2021-09-07 DIAGNOSIS — R2689 Other abnormalities of gait and mobility: Secondary | ICD-10-CM

## 2021-09-09 ENCOUNTER — Other Ambulatory Visit: Payer: Self-pay

## 2021-09-09 ENCOUNTER — Ambulatory Visit (HOSPITAL_COMMUNITY)
Admission: RE | Admit: 2021-09-09 | Discharge: 2021-09-09 | Disposition: A | Discharge status: Home / Self Care | Payer: Medicare (Managed Care) | Attending: Vascular Surgery | Admitting: Vascular Surgery

## 2021-09-09 ENCOUNTER — Encounter (HOSPITAL_COMMUNITY): Payer: Self-pay | Admitting: Vascular Surgery

## 2021-09-09 ENCOUNTER — Ambulatory Visit (HOSPITAL_COMMUNITY)
Admission: RE | Disposition: A | Discharge status: Home / Self Care | Payer: Self-pay | Source: Home / Self Care | Attending: Vascular Surgery

## 2021-09-09 DIAGNOSIS — Z992 Dependence on renal dialysis: Secondary | ICD-10-CM | POA: Insufficient documentation

## 2021-09-09 DIAGNOSIS — Y841 Kidney dialysis as the cause of abnormal reaction of the patient, or of later complication, without mention of misadventure at the time of the procedure: Secondary | ICD-10-CM | POA: Insufficient documentation

## 2021-09-09 DIAGNOSIS — N185 Chronic kidney disease, stage 5: Secondary | ICD-10-CM

## 2021-09-09 DIAGNOSIS — N186 End stage renal disease: Secondary | ICD-10-CM

## 2021-09-09 DIAGNOSIS — T82898A Other specified complication of vascular prosthetic devices, implants and grafts, initial encounter: Secondary | ICD-10-CM

## 2021-09-09 HISTORY — PX: A/V FISTULAGRAM: CATH118298

## 2021-09-09 HISTORY — PX: PERIPHERAL VASCULAR BALLOON ANGIOPLASTY: CATH118281

## 2021-09-09 LAB — POCT I-STAT, CHEM 8
BUN: 55 mg/dL — ABNORMAL HIGH (ref 8–23)
Calcium, Ion: 1.14 mmol/L — ABNORMAL LOW (ref 1.15–1.40)
Chloride: 99 mmol/L (ref 98–111)
Creatinine, Ser: 5.9 mg/dL — ABNORMAL HIGH (ref 0.61–1.24)
Glucose, Bld: 73 mg/dL (ref 70–99)
HCT: 42 % (ref 39.0–52.0)
Hemoglobin: 14.3 g/dL (ref 13.0–17.0)
Potassium: 5.2 mmol/L — ABNORMAL HIGH (ref 3.5–5.1)
Sodium: 138 mmol/L (ref 135–145)
TCO2: 30 mmol/L (ref 22–32)

## 2021-09-09 SURGERY — A/V FISTULAGRAM
Anesthesia: LOCAL | Laterality: Left

## 2021-09-09 MED ORDER — HEPARIN (PORCINE) IN NACL 1000-0.9 UT/500ML-% IV SOLN
INTRAVENOUS | Status: DC | PRN
  Administered 2021-09-09: 500 mL

## 2021-09-09 MED ORDER — FENTANYL CITRATE (PF) 100 MCG/2ML IJ SOLN
INTRAMUSCULAR | Status: AC
  Filled 2021-09-09: qty 2

## 2021-09-09 MED ORDER — LIDOCAINE HCL (PF) 1 % IJ SOLN
INTRAMUSCULAR | Status: AC
  Filled 2021-09-09: qty 30

## 2021-09-09 MED ORDER — IODIXANOL 320 MG/ML IV SOLN
INTRAVENOUS | Status: DC | PRN
  Administered 2021-09-09: 50 mL

## 2021-09-09 MED ORDER — MIDAZOLAM HCL 2 MG/2ML IJ SOLN
INTRAMUSCULAR | Status: AC
  Filled 2021-09-09: qty 2

## 2021-09-09 MED ORDER — MIDAZOLAM HCL 2 MG/2ML IJ SOLN
INTRAMUSCULAR | Status: DC | PRN
  Administered 2021-09-09 (×2): 1 mg via INTRAVENOUS

## 2021-09-09 MED ORDER — FENTANYL CITRATE (PF) 100 MCG/2ML IJ SOLN
INTRAMUSCULAR | Status: DC | PRN
  Administered 2021-09-09 (×2): 50 ug via INTRAVENOUS

## 2021-09-09 SURGICAL SUPPLY — 15 items
BAG SNAP BAND KOVER 36X36 (MISCELLANEOUS) ×3 IMPLANT
BALLN MUSTANG 6.0X100X75 (BALLOONS) ×3
BALLOON MUSTANG 6.0X100X75 (BALLOONS) IMPLANT
COVER DOME SNAP 22 D (MISCELLANEOUS) ×3 IMPLANT
GUIDEWIRE ANGLED .035X150CM (WIRE) ×1 IMPLANT
KIT ENCORE 26 ADVANTAGE (KITS) ×1 IMPLANT
KIT MICROPUNCTURE NIT STIFF (SHEATH) ×1 IMPLANT
PROTECTION STATION PRESSURIZED (MISCELLANEOUS) ×3
SHEATH PINNACLE R/O II 6F 4CM (SHEATH) ×1 IMPLANT
SHEATH PROBE COVER 6X72 (BAG) ×3 IMPLANT
STATION PROTECTION PRESSURIZED (MISCELLANEOUS) ×2 IMPLANT
STOPCOCK MORSE 400PSI 3WAY (MISCELLANEOUS) ×3 IMPLANT
TRAY PV CATH (CUSTOM PROCEDURE TRAY) ×3 IMPLANT
TUBING CIL FLEX 10 FLL-RA (TUBING) ×3 IMPLANT
WIRE BENTSON .035X145CM (WIRE) ×1 IMPLANT

## 2021-09-10 MED FILL — Lidocaine HCl Local Preservative Free (PF) Inj 1%: INTRAMUSCULAR | Qty: 30 | Status: AC

## 2021-09-13 NOTE — Therapy (Signed)
OUTPATIENT PHYSICAL THERAPY TREATMENT NOTE   Patient Name: [name] MRN: [id number] DOB:Jan 07, 1951, 71 y.o., male Today's Date: 09/14/2021  PCP: Oneita Hurt, No   REFERRING PROVIDER: Estanislado Emms, MD  END OF SESSION:   PT End of Session - 09/14/21 1042     Visit Number 3    Number of Visits 12    Date for PT Re-Evaluation 10/26/21    Authorization Type [facility] MEDICARE    Progress Note Due on Visit 10    PT Start Time 1045    PT Stop Time 1130    PT Time Calculation (min) 45 min    Equipment Utilized During Treatment Gait belt    Activity Tolerance Patient tolerated treatment well    Behavior During Therapy WFL for tasks assessed/performed              Past Medical History:  Diagnosis Date   A-fib ([facility])    A-fib ([facility])    Edema    ESRD (end stage renal disease) ([facility])    MWF Berenice Primas   History of insertion of tunneled central venous catheter (CVC) with port    Hypotension    Past Surgical History:  Procedure Laterality Date   A/V FISTULAGRAM Left 09/09/2021   Procedure: A/V Fistulagram;  Surgeon: Leonie Douglas, MD;  Location: [facility] INVASIVE CV [facility];  Service: Cardiovascular;  Laterality: Left;   AV FISTULA PLACEMENT Left 06/29/2021   Procedure: LEFT RADIOCEPHALIC ARTERIOVENOUS (AV) FISTULA CREATION;  Surgeon: Chuck Hint, MD;  Location: [facility] OR;  Service: Vascular;  Laterality: Left;   IR FLUORO GUIDE CV LINE RIGHT  06/04/2021   IR REMOVAL TUN CV CATH W/O FL  06/04/2021   IR US GUIDE VASC ACCESS RIGHT  06/04/2021   PERIPHERAL VASCULAR BALLOON ANGIOPLASTY  09/09/2021   Procedure: PERIPHERAL VASCULAR BALLOON ANGIOPLASTY;  Surgeon: Leonie Douglas, MD;  Location: [facility] INVASIVE CV LAB;  Service: Cardiovascular;;   Patient Active Problem List   Diagnosis Date Noted   Hemodialysis catheter dysfunction ([facility]) 06/11/2021   Pressure injury of skin 06/10/2021   Bradycardia 06/09/2021   Paroxysmal atrial fibrillation ([facility]) 06/09/2021   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  PAIN:  Are you having pain? Yes:  NPRS scale: 5/10 Pain location: right hip, left shoulder, right wrist Pain description: chronic, intermittent, aching Aggravating factors: Standing, pushing from armrest of chair, reaching overhead Relieving factors: Rest  PATIENT GOALS: Patient states he wants to be able to stand and walk on his own.   OBJECTIVE: (objective measures completed at initial evaluation unless otherwise dated) MUSCLE LENGTH: Significant limitation of hamstring muscle length   POSTURE: rounded shoulders, forward head, and decreased lumbar lordosis   LOWER EXTREMITY ROM: Limitation in bilateral knee extension, lacking approx 35-40 deg bilaterally   LOWER EXTREMITY MMT:   MMT Right eval Left eval  Hip flexion 4- 4-  Hip extension 3- 3-  Hip abduction 3- 3-  Knee flexion 4 4  Knee extension 4 4    FUNCTIONAL TESTS:  Sit to stand: patient requires significant use  of BUE for support for sit to stand and controlled of decent   GAIT: - 09/14/2021 Distance walked: 130 ft (65 ft x 2, rest break needed due to left knee pain) Assistive device utilized: RW Level of assistance: CGA Comments: knee flexion contracture, early toe off more on left, heavy reliance on UE for support     TODAY'S TREATMENT: [facility] Adult PT Treatment:                                                DATE: 09/14/2021 Gait Training: Walking with RW 65 ft x 2 with GCA Therapeutic Exercise: NuStep L6  x 8 min with UE/LE to improve strength, endurance, workload capacity Sit to stand 2 x 10 from elevated table with hands on thighs Heel raises 2 x 20 in RW Standing hip abduction 2 x 5 each LAQ 2 x 20   [facility] Adult PT Treatment:                                                DATE: 09/07/2021 Gait Training: Walking in // bars 3 x 2 lengths Walking with RW x 65 ft with GCA Therapeutic Exercise: Squat in // bars with BUE support 2 x 10 Heel raises 2 x 20 LAQ with 3# 2 x 10 each Hookling clamshell with green 2 x 10 SLR 2 x 10 (patient with significant knee flexion contracture) NuStep L6 x 5 min with UE/LE to improve strength, endurance, workload capacity  [facility] Adult PT Treatment:                                                DATE: 08/31/2021 Therapeutic Exercise: Bridge x 15 SLR (lacking full knee extension) x 15 Side clamshell x 20 Seated clamshell with red x 20 Seated alternating march with red x 20 LAQ with red  x 15 Mini squat with BUE support at counter x 10   PATIENT EDUCATION:  Education details: HEP Person educated: Patient Education method: Programmer, multimedia, Demonstration, Actor cues, Verbal cues Education comprehension: verbalized understanding, returned demonstration, verbal cues required, tactile cues required, and needs further education   HOME EXERCISE PROGRAM: Access Code: [email or web address]     ASSESSMENT: CLINICAL IMPRESSION: Patient tolerated therapy well with no adverse effects. Therapy continues to focus primarily on strengthening and walking ability. Continued with gait training using RW and CGA for safety, patient able to double walking distance but he did require seated rest break due to left knee pain. Incorporated progressions in standing strengthening this visit but patient with difficulty due to limitations in knee motion and left knee pain. No changes to HEP this visit. Patient would benefit from continued skilled PT to progress strength and mobility in order to  improving walking and maximize functional ability.     OBJECTIVE IMPAIRMENTS Abnormal gait, decreased activity tolerance, decreased balance, decreased mobility, difficulty walking, decreased ROM, decreased strength, impaired flexibility, postural dysfunction, and pain.    ACTIVITY LIMITATIONS standing, squatting, stairs, transfers, and locomotion level   PARTICIPATION LIMITATIONS: meal prep, cleaning, laundry, driving, shopping, community activity, occupation, and yard work  PERSONAL FACTORS Age, Fitness, Past/current experiences, Time since onset of injury/illness/exacerbation, Transportation, and 1 comorbidity: ESRD on hemodialysis  are also affecting patient's functional outcome.      GOALS: Goals reviewed with patient? Yes   SHORT TERM GOALS: Target date: 09/28/2021    Patient will be I with initial HEP in order to progress with therapy. Baseline: HEP provided at eval Goal status: INITIAL   2.  Patient be able to perform sit to stand without UE support to indicate improved strength and functional mobility. Baseline: patient requires use of BUE Goal status: INITIAL   3.  Patient will be able to stand >/= 1 min without UE support in order to improve balance and ability to perform ADLs Baseline: patient unable to stand without UE support Goal status: INITIAL   LONG TERM GOALS: Target date: 10/26/2021    Patient will be I with final HEP to maintain progress from PT. Baseline: HEP provided at eval Goal status: INITIAL   2.  Patient will be able to ambulate at mod I level using RW for 29ft in order to improve independence with household ambulation. Baseline: Patient unable to ambulate Goal status: INITIAL   3.  Patient will exhibit bilateral knee extension lacking </= 20 deg in order to improve gait and standing ability Baseline: lacking approx 35-40 deg Goal status: INITIAL   4.  Patient will exhibit hip strength >/= 4-/5 MMT and knee strength >/= 4+/5 MMT in order to improve  walking and standing tolerance. Baseline: patient exhibits deficits in hip and knee strength Goal status: INITIAL     PLAN: PT FREQUENCY: 1-2x/week   PT DURATION: 8 weeks   PLANNED INTERVENTIONS: Therapeutic exercises, Therapeutic activity, Neuromuscular re-education, Balance training, Gait training, Patient/Family education, Joint mobilization, Stair training, Aquatic Therapy, Cryotherapy, Moist heat, Manual therapy, and Re-evaluation   PLAN FOR NEXT SESSION: Review HEP and progress PRN, standing and gait training in // bars, gross LE strengthening, knee extension mobility and stretching    Rosana Hoes, PT, DPT, LAT, ATC 09/14/21  11:30 AM Phone: [phone] Fax: [phone]

## 2021-09-14 ENCOUNTER — Ambulatory Visit: Payer: Medicare (Managed Care) | Admitting: Physical Therapy

## 2021-09-14 ENCOUNTER — Encounter: Payer: Self-pay | Admitting: Physical Therapy

## 2021-09-14 ENCOUNTER — Other Ambulatory Visit: Payer: Self-pay

## 2021-09-14 DIAGNOSIS — R2689 Other abnormalities of gait and mobility: Secondary | ICD-10-CM

## 2021-09-14 DIAGNOSIS — M6281 Muscle weakness (generalized): Secondary | ICD-10-CM | POA: Diagnosis not present

## 2021-09-16 ENCOUNTER — Encounter: Payer: Self-pay | Admitting: Physical Therapy

## 2021-09-16 ENCOUNTER — Ambulatory Visit: Payer: Medicare (Managed Care) | Admitting: Physical Therapy

## 2021-09-16 ENCOUNTER — Other Ambulatory Visit: Payer: Self-pay

## 2021-09-16 DIAGNOSIS — R2689 Other abnormalities of gait and mobility: Secondary | ICD-10-CM

## 2021-09-16 DIAGNOSIS — M6281 Muscle weakness (generalized): Secondary | ICD-10-CM | POA: Diagnosis not present

## 2021-09-16 NOTE — Therapy (Signed)
OUTPATIENT PHYSICAL THERAPY TREATMENT NOTE   Patient Name: [name] MRN: [id number] DOB:11/05/1950, 71 y.o., male Today's Date: 09/16/2021  PCP: Merryl Hacker, No   REFERRING PROVIDER: Claudia Desanctis, MD  END OF SESSION:   PT End of Session - 09/16/21 0921     Visit Number 4    Number of Visits 12    Date for PT Re-Evaluation 10/26/21    Authorization Type [facility] MEDICARE    Progress Note Due on Visit 10    PT Start Time 0915    PT Stop Time 1000    PT Time Calculation (min) 45 min    Equipment Utilized During Treatment Gait belt    Activity Tolerance Patient tolerated treatment well    Behavior During Therapy WFL for tasks assessed/performed               Past Medical History:  Diagnosis Date   A-fib ([place])    A-fib ([place])    Edema    ESRD (end stage renal disease) ([place])    MWF Emilie Rutter   History of insertion of tunneled central venous catheter (CVC) with port    Hypotension    Past Surgical History:  Procedure Laterality Date   A/V FISTULAGRAM Left 09/09/2021   Procedure: A/V Fistulagram;  Surgeon: Cherre Robins, MD;  Location: [place] [facility];  Service: Cardiovascular;  Laterality: Left;   AV FISTULA PLACEMENT Left 06/29/2021   Procedure: LEFT RADIOCEPHALIC ARTERIOVENOUS (AV) FISTULA CREATION;  Surgeon: Angelia Mould, MD;  Location: [facility] OR;  Service: Vascular;  Laterality: Left;   IR FLUORO GUIDE CV LINE RIGHT  06/04/2021   IR REMOVAL TUN CV CATH W/O FL  06/04/2021   IR US GUIDE VASC ACCESS RIGHT  06/04/2021   PERIPHERAL VASCULAR BALLOON ANGIOPLASTY  09/09/2021   Procedure: PERIPHERAL VASCULAR BALLOON ANGIOPLASTY;  Surgeon: Cherre Robins, MD;  Location: [place] [facility];  Service: Cardiovascular;;   Patient Active Problem List   Diagnosis Date Noted   Hemodialysis catheter dysfunction ([place]) 06/11/2021   Pressure injury of skin 06/10/2021   Bradycardia 06/09/2021   Paroxysmal atrial fibrillation ([place]) 06/09/2021   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  ([place]) 81/15/7262   Complication of vascular dialysis catheter, initial encounter    Hyperkalemia     REFERRING DIAG: Mobility - indication deconditioning in ESRD patient  THERAPY DIAG:  Muscle weakness (generalized)  Other abnormalities of gait and mobility  Rationale for Evaluation and Treatment Rehabilitation  PERTINENT HISTORY: ESRD on hemodialysis, AV fistula placement,  PRECAUTIONS: Fall, patient currently non-ambulatory  SUBJECTIVE: Patient reports left knee has been bothering him.  PAIN:  Are you having pain? Yes:  NPRS scale: 5/10 Pain location: right hip, left shoulder, right wrist, left knee Pain description: chronic, intermittent, aching Aggravating factors: Standing, pushing from armrest of chair, reaching overhead Relieving factors: Rest  PATIENT GOALS: Patient states he wants to be able to stand and walk on his own.   OBJECTIVE: (objective measures completed at initial evaluation unless otherwise dated) MUSCLE LENGTH: Significant limitation of hamstring muscle length   POSTURE: rounded shoulders, forward head, and decreased lumbar lordosis   LOWER EXTREMITY ROM: Limitation in bilateral knee extension, lacking approx 35-40 deg bilaterally   LOWER EXTREMITY MMT:   MMT Right eval Left eval  Hip flexion 4- 4-  Hip extension 3- 3-  Hip abduction 3- 3-  Knee flexion 4 4  Knee extension 4 4    FUNCTIONAL TESTS:  Sit to stand: patient requires significant  use of BUE for support for sit to stand and controlled of decent   GAIT: - 09/14/2021 Distance walked: 130 ft (65 ft x 2, rest break needed due to left knee pain) Assistive device utilized: RW Level of assistance: CGA Comments: knee flexion contracture, early toe off more on left, heavy reliance on UE for support     TODAY'S TREATMENT: [facility] Adult PT Treatment:                                                DATE: 09/16/2021 Therapeutic Exercise: NuStep L6 x 8 min with UE/LE to improve strength,  endurance, workload capacity Standing march in // bars with 4# 3 x 10 Stand hip abduction in // bars with 4# 3 x 5 each LAQ with 4# 3 x 10 each Sit to stand 2 x 10 from elevated table with hands on thighs Bridge 2 x 10 Manual: Passive hamstring and hip flexor stretching Passive knee extension   [facility] Adult PT Treatment:                                                DATE: 09/14/2021 Gait Training: Walking with RW 65 ft x 2 with GCA Therapeutic Exercise: NuStep L6 x 8 min with UE/LE to improve strength, endurance, workload capacity Sit to stand 2 x 10 from elevated table with hands on thighs Heel raises 2 x 20 in RW Standing hip abduction 2 x 5 each LAQ 2 x 20  [facility] Adult PT Treatment:                                                DATE: 09/07/2021 Gait Training: Walking in // bars 3 x 2 lengths Walking with RW x 65 ft with GCA Therapeutic Exercise: Squat in // bars with BUE support 2 x 10 Heel raises 2 x 20 LAQ with 3# 2 x 10 each Hookling clamshell with green 2 x 10 SLR 2 x 10 (patient with significant knee flexion contracture) NuStep L6 x 5 min with UE/LE to improve strength, endurance, workload capacity   PATIENT EDUCATION:  Education details: HEP Person educated: Patient Education method: Consulting civil engineer, Demonstration, Corporate treasurer cues, Verbal cues Education comprehension: verbalized understanding, returned demonstration, verbal cues required, tactile cues required, and needs further education   HOME EXERCISE PROGRAM: Access Code: [email or web address]     ASSESSMENT: CLINICAL IMPRESSION: Patient tolerated therapy well with no adverse effects. Therapy incorporated manual therapy to improve his knee and hip mobility. He demonstrates a flexion contracture of both the knee and hip likely from sitting in wheelchair for months. He was encouraged to work on stretching leg out straight and even lying prone to improve hip extension. He is progress well with strengthening exercises and was able to  tolerate addition of weight to standing exercises. He does require heavy reliance on BUE and CGA for safety with standing tasks. He is ambulating using a RW in clinic between areas and seems to be improving with comfort level but still with significant deviations due to mobility deficits and knee pain. Patient would benefit from continued  skilled PT to progress strength and mobility in order to improving walking and maximize functional ability.     OBJECTIVE IMPAIRMENTS Abnormal gait, decreased activity tolerance, decreased balance, decreased mobility, difficulty walking, decreased ROM, decreased strength, impaired flexibility, postural dysfunction, and pain.    ACTIVITY LIMITATIONS standing, squatting, stairs, transfers, and locomotion level   PARTICIPATION LIMITATIONS: meal prep, cleaning, laundry, driving, shopping, community activity, occupation, and yard work   PERSONAL FACTORS Age, Fitness, Past/current experiences, Time since onset of injury/illness/exacerbation, Transportation, and 1 comorbidity: ESRD on hemodialysis  are also affecting patient's functional outcome.      GOALS: Goals reviewed with patient? Yes   SHORT TERM GOALS: Target date: 09/28/2021    Patient will be I with initial HEP in order to progress with therapy. Baseline: HEP provided at eval Goal status: INITIAL   2.  Patient be able to perform sit to stand without UE support to indicate improved strength and functional mobility. Baseline: patient requires use of BUE Goal status: INITIAL   3.  Patient will be able to stand >/= 1 min without UE support in order to improve balance and ability to perform ADLs Baseline: patient unable to stand without UE support Goal status: INITIAL   LONG TERM GOALS: Target date: 10/26/2021    Patient will be I with final HEP to maintain progress from PT. Baseline: HEP provided at eval Goal status: INITIAL   2.  Patient will be able to ambulate at mod I level using RW for 68ft in  order to improve independence with household ambulation. Baseline: Patient unable to ambulate Goal status: INITIAL   3.  Patient will exhibit bilateral knee extension lacking </= 20 deg in order to improve gait and standing ability Baseline: lacking approx 35-40 deg Goal status: INITIAL   4.  Patient will exhibit hip strength >/= 4-/5 MMT and knee strength >/= 4+/5 MMT in order to improve walking and standing tolerance. Baseline: patient exhibits deficits in hip and knee strength Goal status: INITIAL     PLAN: PT FREQUENCY: 1-2x/week   PT DURATION: 8 weeks   PLANNED INTERVENTIONS: Therapeutic exercises, Therapeutic activity, Neuromuscular re-education, Balance training, Gait training, Patient/Family education, Joint mobilization, Stair training, Aquatic Therapy, Cryotherapy, Moist heat, Manual therapy, and Re-evaluation   PLAN FOR NEXT SESSION: Review HEP and progress PRN, standing and gait training in // bars, gross LE strengthening, knee extension mobility and stretching    Hilda Blades, PT, DPT, LAT, ATC 09/16/21  10:30 AM Phone: [phone] Fax: [phone]

## 2021-09-22 NOTE — Therapy (Signed)
OUTPATIENT PHYSICAL THERAPY TREATMENT NOTE   Patient Name: [name] MRN: [id number] DOB:1950-03-29, 71 y.o., male Today's Date: 09/23/2021  PCP: Merryl Hacker, No   REFERRING PROVIDER: Claudia Desanctis, MD  END OF SESSION:   PT End of Session - 09/23/21 1050     Visit Number 5    Number of Visits 12    Date for PT Re-Evaluation 10/26/21    Authorization Type [facility] MEDICARE    Progress Note Due on Visit 10    PT Start Time 1045    PT Stop Time 1130    PT Time Calculation (min) 45 min    Equipment Utilized During Treatment Gait belt    Activity Tolerance Patient tolerated treatment well    Behavior During Therapy WFL for tasks assessed/performed                Past Medical History:  Diagnosis Date   A-fib ([place])    A-fib ([place])    Edema    ESRD (end stage renal disease) ([place])    MWF Emilie Rutter   History of insertion of tunneled central venous catheter (CVC) with port    Hypotension    Past Surgical History:  Procedure Laterality Date   A/V FISTULAGRAM Left 09/09/2021   Procedure: A/V Fistulagram;  Surgeon: Cherre Robins, MD;  Location: [place] [facility];  Service: Cardiovascular;  Laterality: Left;   AV FISTULA PLACEMENT Left 06/29/2021   Procedure: LEFT RADIOCEPHALIC ARTERIOVENOUS (AV) FISTULA CREATION;  Surgeon: Angelia Mould, MD;  Location: [facility] OR;  Service: Vascular;  Laterality: Left;   IR FLUORO GUIDE CV LINE RIGHT  06/04/2021   IR REMOVAL TUN CV CATH W/O FL  06/04/2021   IR US GUIDE VASC ACCESS RIGHT  06/04/2021   PERIPHERAL VASCULAR BALLOON ANGIOPLASTY  09/09/2021   Procedure: PERIPHERAL VASCULAR BALLOON ANGIOPLASTY;  Surgeon: Cherre Robins, MD;  Location: [place] [facility];  Service: Cardiovascular;;   Patient Active Problem List   Diagnosis Date Noted   Hemodialysis catheter dysfunction ([place]) 06/11/2021   Pressure injury of skin 06/10/2021   Bradycardia 06/09/2021   Paroxysmal atrial fibrillation ([place]) 06/09/2021   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  PAIN:  Are you having pain? Yes:  NPRS scale: 5/10 Pain location: right hip, left shoulder, right wrist, left knee Pain description: chronic, intermittent, aching Aggravating factors: Standing, pushing from armrest of chair, reaching overhead Relieving factors: Rest  PATIENT GOALS: Patient states he wants to be able to stand and walk on his own.   OBJECTIVE: (objective measures completed at initial evaluation unless otherwise dated) MUSCLE LENGTH: Significant limitation of hamstring muscle length   POSTURE: rounded shoulders, forward head, and decreased lumbar lordosis   LOWER EXTREMITY ROM: Limitation in bilateral knee extension, lacking approx 35-40 deg bilaterally   LOWER EXTREMITY MMT:   MMT Right eval Left eval  Hip flexion 4- 4-  Hip extension 3- 3-  Hip abduction 3- 3-  Knee flexion 4 4  Knee extension 4 4    FUNCTIONAL TESTS:  Sit to stand: patient  requires significant use of BUE for support for sit to stand and controlled of decent   GAIT: - 09/14/2021 Distance walked: 130 ft (65 ft x 2, rest break needed due to left knee pain) Assistive device utilized: RW Level of assistance: CGA Comments: knee flexion contracture, early toe off more on left, heavy reliance on UE for support     TODAY'S TREATMENT: [facility] Adult PT Treatment:                                                DATE: 09/16/2021 Therapeutic Exercise: Recumbent bike L2 x 5 min to improve strength,  endurance, workload capacity Squat with BUE in // bars 2 x 10 Standing march in // bars 2 x 10 each - focus on maintaining stance leg knee extension Side stepping in // bars with yellow at knees x 2 lengths Knee extension machine 15# 3 x 10 Manual: Passive hamstring and hip flexor stretching Passive knee extension   [facility] Adult PT Treatment:                                                DATE: 09/16/2021 Therapeutic Exercise: NuStep L6 x 8 min with UE/LE to improve strength, endurance, workload capacity Standing march in // bars with 4# 3 x 10 Stand hip abduction in // bars with 4# 3 x 5 each LAQ with 4# 3 x 10 each Sit to stand 2 x 10 from elevated table with hands on thighs Bridge 2 x 10 Manual: Passive hamstring and hip flexor stretching Passive knee extension  [facility] Adult PT Treatment:                                                DATE: 09/14/2021 Gait Training: Walking with RW 65 ft x 2 with GCA Therapeutic Exercise: NuStep L6 x 8 min with UE/LE to improve strength, endurance, workload capacity Sit to stand 2 x 10 from elevated table with hands on thighs Heel raises 2 x 20 in RW Standing hip abduction 2 x 5 each LAQ 2 x 20   PATIENT EDUCATION:  Education details: HEP Person educated: Patient Education method: Consulting civil engineer, Demonstration, Corporate treasurer cues, Verbal cues Education comprehension: verbalized understanding, returned demonstration, verbal cues required, tactile cues required, and needs further education   HOME EXERCISE PROGRAM: Access Code: [email or web address]     ASSESSMENT: CLINICAL IMPRESSION: Patient tolerated therapy well with no adverse effects. Therapy focuses on stretching to improve hip and knee mobility, and continuing to progress strengthening with more standing exercises and incorporating some machine based strengthening. He continues to exhibit limitations in hip and knee motion that limit his walking, but he is improving with his walking using a RW. No changes to  HEP this visit. Patient would benefit from continued skilled PT to progress strength and mobility in order to improving walking and maximize functional ability.     OBJECTIVE IMPAIRMENTS Abnormal gait, decreased activity tolerance, decreased balance, decreased mobility, difficulty walking, decreased ROM, decreased strength, impaired flexibility, postural dysfunction, and pain.    ACTIVITY LIMITATIONS standing, squatting, stairs, transfers, and locomotion level  PARTICIPATION LIMITATIONS: meal prep, cleaning, laundry, driving, shopping, community activity, occupation, and yard work   PERSONAL FACTORS Age, Fitness, Past/current experiences, Time since onset of injury/illness/exacerbation, Transportation, and 1 comorbidity: ESRD on hemodialysis  are also affecting patient's functional outcome.      GOALS: Goals reviewed with patient? Yes   SHORT TERM GOALS: Target date: 09/28/2021    Patient will be I with initial HEP in order to progress with therapy. Baseline: HEP provided at eval Goal status: INITIAL   2.  Patient be able to perform sit to stand without UE support to indicate improved strength and functional mobility. Baseline: patient requires use of BUE Goal status: INITIAL   3.  Patient will be able to stand >/= 1 min without UE support in order to improve balance and ability to perform ADLs Baseline: patient unable to stand without UE support Goal status: INITIAL   LONG TERM GOALS: Target date: 10/26/2021    Patient will be I with final HEP to maintain progress from PT. Baseline: HEP provided at eval Goal status: INITIAL   2.  Patient will be able to ambulate at mod I level using RW for 35ft in order to improve independence with household ambulation. Baseline: Patient unable to ambulate Goal status: INITIAL   3.  Patient will exhibit bilateral knee extension lacking </= 20 deg in order to improve gait and standing ability Baseline: lacking approx 35-40 deg Goal status:  INITIAL   4.  Patient will exhibit hip strength >/= 4-/5 MMT and knee strength >/= 4+/5 MMT in order to improve walking and standing tolerance. Baseline: patient exhibits deficits in hip and knee strength Goal status: INITIAL     PLAN: PT FREQUENCY: 1-2x/week   PT DURATION: 8 weeks   PLANNED INTERVENTIONS: Therapeutic exercises, Therapeutic activity, Neuromuscular re-education, Balance training, Gait training, Patient/Family education, Joint mobilization, Stair training, Aquatic Therapy, Cryotherapy, Moist heat, Manual therapy, and Re-evaluation   PLAN FOR NEXT SESSION: Review HEP and progress PRN, standing and gait training in // bars, gross LE strengthening, knee extension mobility and stretching    Hilda Blades, PT, DPT, LAT, ATC 09/23/21  12:29 PM Phone: [phone] Fax: [phone]

## 2021-09-23 ENCOUNTER — Other Ambulatory Visit: Payer: Self-pay

## 2021-09-23 ENCOUNTER — Ambulatory Visit: Payer: Medicare (Managed Care) | Attending: Nephrology | Admitting: Physical Therapy

## 2021-09-23 ENCOUNTER — Encounter: Payer: Self-pay | Admitting: Physical Therapy

## 2021-09-23 DIAGNOSIS — R2689 Other abnormalities of gait and mobility: Secondary | ICD-10-CM | POA: Diagnosis present

## 2021-09-23 DIAGNOSIS — M6281 Muscle weakness (generalized): Secondary | ICD-10-CM | POA: Diagnosis not present

## 2021-09-28 ENCOUNTER — Ambulatory Visit: Payer: Medicare (Managed Care) | Admitting: Physical Therapy

## 2021-09-28 ENCOUNTER — Other Ambulatory Visit: Payer: Self-pay

## 2021-09-28 ENCOUNTER — Encounter: Payer: Self-pay | Admitting: Physical Therapy

## 2021-09-28 DIAGNOSIS — M6281 Muscle weakness (generalized): Secondary | ICD-10-CM | POA: Diagnosis not present

## 2021-09-28 DIAGNOSIS — R2689 Other abnormalities of gait and mobility: Secondary | ICD-10-CM

## 2021-09-28 NOTE — Therapy (Signed)
OUTPATIENT PHYSICAL THERAPY TREATMENT NOTE   Patient Name: [name] MRN: [id number] DOB:May 12, 1950, 71 y.o., male Today's Date: 09/28/2021  PCP: Merryl Hacker, No   REFERRING PROVIDER: Claudia Desanctis, MD   END OF SESSION:   PT End of Session - 09/28/21 1058     Visit Number 6    Number of Visits 12    Date for PT Re-Evaluation 10/26/21    Authorization Type [facility] MEDICARE    Progress Note Due on Visit 10    PT Start Time 1048    PT Stop Time 1130    PT Time Calculation (min) 42 min    Activity Tolerance Patient tolerated treatment well    Behavior During Therapy WFL for tasks assessed/performed                 Past Medical History:  Diagnosis Date   A-fib ([place])    A-fib ([place])    Edema    ESRD (end stage renal disease) ([place])    MWF Emilie Rutter   History of insertion of tunneled central venous catheter (CVC) with port    Hypotension    Past Surgical History:  Procedure Laterality Date   A/V FISTULAGRAM Left 09/09/2021   Procedure: A/V Fistulagram;  Surgeon: Cherre Robins, MD;  Location: [place] [facility];  Service: Cardiovascular;  Laterality: Left;   AV FISTULA PLACEMENT Left 06/29/2021   Procedure: LEFT RADIOCEPHALIC ARTERIOVENOUS (AV) FISTULA CREATION;  Surgeon: Angelia Mould, MD;  Location: [facility] OR;  Service: Vascular;  Laterality: Left;   IR FLUORO GUIDE CV LINE RIGHT  06/04/2021   IR REMOVAL TUN CV CATH W/O FL  06/04/2021   IR US GUIDE VASC ACCESS RIGHT  06/04/2021   PERIPHERAL VASCULAR BALLOON ANGIOPLASTY  09/09/2021   Procedure: PERIPHERAL VASCULAR BALLOON ANGIOPLASTY;  Surgeon: Cherre Robins, MD;  Location: [place] [facility];  Service: Cardiovascular;;   Patient Active Problem List   Diagnosis Date Noted   Hemodialysis catheter dysfunction ([place]) 06/11/2021   Pressure injury of skin 06/10/2021   Bradycardia 06/09/2021   Paroxysmal atrial fibrillation ([place]) 06/09/2021   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 ([place]) 11/91/4782   Complication of vascular  dialysis catheter, initial encounter    Hyperkalemia     REFERRING DIAG: Mobility - indication deconditioning in ESRD patient  THERAPY DIAG:  Muscle weakness (generalized)  Other abnormalities of gait and mobility  Rationale for Evaluation and Treatment Rehabilitation  PERTINENT HISTORY: ESRD on hemodialysis, AV fistula placement,  PRECAUTIONS: Fall, patient currently non-ambulatory   SUBJECTIVE: Patient arrives using rollator this visit and reports he has been walking more around his home.  PAIN:  Are you having pain? Yes:  NPRS scale: 5/10 Pain location: right hip, left shoulder, right wrist, left knee Pain description: chronic, intermittent, aching Aggravating factors: Standing, pushing from armrest of chair, reaching overhead Relieving factors: Rest  PATIENT GOALS: Patient states he wants to be able to stand and walk on his own.   OBJECTIVE: (objective measures completed at initial evaluation unless otherwise dated) MUSCLE LENGTH: Significant limitation of hamstring muscle length   POSTURE: rounded shoulders, forward head, and decreased lumbar lordosis   LOWER EXTREMITY ROM: Limitation in bilateral knee extension, lacking approx 35-40 deg bilaterally   LOWER EXTREMITY MMT:   MMT Right eval Left eval  Hip flexion 4- 4-  Hip extension 3- 3-  Hip abduction 3- 3-  Knee flexion 4 4  Knee extension 4 4    FUNCTIONAL TESTS:  Sit to stand:  patient requires significant use of BUE for support for sit to stand and controlled of decent   GAIT: - 09/28/2021 Distance walked: - Assistive device utilized: Rollator Level of assistance: Supervision Comments: knee flexion contracture, early toe off more on left, he is able to place more weight through LE rather than as much reliance on UE     TODAY'S TREATMENT: [facility] Adult PT Treatment:                                                DATE: 09/28/2021 Therapeutic Exercise: Recumbent bike L2 x 5 min to improve strength,  endurance, workload capacity Knee extension machine 15# 3 x 15 Side stepping in at counter with yellow at knees 2 x 2 lengths Sit to stand from elevated table with hands on thighs 2 x 10 Manual: Passive hamstring and hip flexor stretching Passive knee extension, hip extension and abduction   [facility] Adult PT Treatment:                                                DATE: 09/16/2021 Therapeutic Exercise: Recumbent bike L2 x 5 min to improve strength, endurance, workload capacity Squat with BUE in // bars 2 x 10 Standing march in // bars 2 x 10 each - focus on maintaining stance leg knee extension Side stepping in // bars with yellow at knees x 2 lengths Knee extension machine 15# 3 x 10 Manual: Passive hamstring and hip flexor stretching Passive knee extension  [facility] Adult PT Treatment:                                                DATE: 09/16/2021 Therapeutic Exercise: NuStep L6 x 8 min with UE/LE to improve strength, endurance, workload capacity Standing march in // bars with 4# 3 x 10 Stand hip abduction in // bars with 4# 3 x 5 each LAQ with 4# 3 x 10 each Sit to stand 2 x 10 from elevated table with hands on thighs Bridge 2 x 10 Manual: Passive hamstring and hip flexor stretching Passive knee extension   PATIENT EDUCATION:  Education details: HEP, prone lying for hip stretching Person educated: Patient Education method: Explanation, Demonstration, Tactile cues, Verbal cues Education comprehension: verbalized understanding, returned demonstration, verbal cues required, tactile cues required, and needs further education   HOME EXERCISE PROGRAM: Access Code: [email or web address]     ASSESSMENT: CLINICAL IMPRESSION: Patient tolerated therapy well with no adverse effects. Therapy continues to focus on progressing knee and hip mobility and progression of strengthening to improve ability to stand and walk. He did arrive using rollator and is demonstrating much improve walking ability without  the need for CGA this visit. He is progressing well with strengthening and weight bearing tolerance. His hip and knee flexor contractures seem to be improving but continue to limit his mobility. No changes to HEP but patient was encouraged to lay prone and work on stretching for hip and knee mobility. Patient would benefit from continued skilled PT to progress strength and mobility in order to improving walking and maximize functional ability.  Therapy focuses on stretching to improve hip and knee mobility, and continuing to progress strengthening with more standing exercises and incorporating some machine based strengthening. He continues to exhibit limitations in hip and knee motion that limit his walking, but he is improving with his walking using a RW. No changes to HEP this visit.      OBJECTIVE IMPAIRMENTS Abnormal gait, decreased activity tolerance, decreased balance, decreased mobility, difficulty walking, decreased ROM, decreased strength, impaired flexibility, postural dysfunction, and pain.    ACTIVITY LIMITATIONS standing, squatting, stairs, transfers, and locomotion level   PARTICIPATION LIMITATIONS: meal prep, cleaning, laundry, driving, shopping, community activity, occupation, and yard work   PERSONAL FACTORS Age, Fitness, Past/current experiences, Time since onset of injury/illness/exacerbation, Transportation, and 1 comorbidity: ESRD on hemodialysis  are also affecting patient's functional outcome.      GOALS: Goals reviewed with patient? Yes   SHORT TERM GOALS: Target date: 09/28/2021    Patient will be I with initial HEP in order to progress with therapy. Baseline: HEP provided at eval Goal status: INITIAL   2.  Patient be able to perform sit to stand without UE support to indicate improved strength and functional mobility. Baseline: patient requires use of BUE Goal status: INITIAL   3.  Patient will be able to stand >/= 1 min without UE support in order to improve  balance and ability to perform ADLs Baseline: patient unable to stand without UE support Goal status: INITIAL   LONG TERM GOALS: Target date: 10/26/2021    Patient will be I with final HEP to maintain progress from PT. Baseline: HEP provided at eval Goal status: INITIAL   2.  Patient will be able to ambulate at mod I level using RW for 15ft in order to improve independence with household ambulation. Baseline: Patient unable to ambulate Goal status: INITIAL   3.  Patient will exhibit bilateral knee extension lacking </= 20 deg in order to improve gait and standing ability Baseline: lacking approx 35-40 deg Goal status: INITIAL   4.  Patient will exhibit hip strength >/= 4-/5 MMT and knee strength >/= 4+/5 MMT in order to improve walking and standing tolerance. Baseline: patient exhibits deficits in hip and knee strength Goal status: INITIAL     PLAN: PT FREQUENCY: 1-2x/week   PT DURATION: 8 weeks   PLANNED INTERVENTIONS: Therapeutic exercises, Therapeutic activity, Neuromuscular re-education, Balance training, Gait training, Patient/Family education, Joint mobilization, Stair training, Aquatic Therapy, Cryotherapy, Moist heat, Manual therapy, and Re-evaluation   PLAN FOR NEXT SESSION: Review HEP and progress PRN, standing and gait training in // bars, gross LE strengthening, knee extension mobility and stretching    Hilda Blades, PT, DPT, LAT, ATC 09/28/21  11:45 AM Phone: [phone] Fax: [phone]

## 2021-09-29 NOTE — Therapy (Signed)
OUTPATIENT PHYSICAL THERAPY TREATMENT NOTE   Patient Name: [name] MRN: [id number] DOB:1951-03-11, 71 y.o., male Today's Date: 09/30/2021  PCP: Merryl Hacker, No   REFERRING PROVIDER: Claudia Desanctis, MD   END OF SESSION:   PT End of Session - 09/30/21 1025     Visit Number 7    Number of Visits 12    Date for PT Re-Evaluation 10/26/21    Authorization Type [facility] MEDICARE    Progress Note Due on Visit 10    PT Start Time 1000    PT Stop Time 1045    PT Time Calculation (min) 45 min    Activity Tolerance Patient tolerated treatment well    Behavior During Therapy WFL for tasks assessed/performed                  Past Medical History:  Diagnosis Date   A-fib ([place])    A-fib ([place])    Edema    ESRD (end stage renal disease) ([place])    MWF Emilie Rutter   History of insertion of tunneled central venous catheter (CVC) with port    Hypotension    Past Surgical History:  Procedure Laterality Date   A/V FISTULAGRAM Left 09/09/2021   Procedure: A/V Fistulagram;  Surgeon: Cherre Robins, MD;  Location: [place] [facility];  Service: Cardiovascular;  Laterality: Left;   AV FISTULA PLACEMENT Left 06/29/2021   Procedure: LEFT RADIOCEPHALIC ARTERIOVENOUS (AV) FISTULA CREATION;  Surgeon: Angelia Mould, MD;  Location: [facility] OR;  Service: Vascular;  Laterality: Left;   IR FLUORO GUIDE CV LINE RIGHT  06/04/2021   IR REMOVAL TUN CV CATH W/O FL  06/04/2021   IR US GUIDE VASC ACCESS RIGHT  06/04/2021   PERIPHERAL VASCULAR BALLOON ANGIOPLASTY  09/09/2021   Procedure: PERIPHERAL VASCULAR BALLOON ANGIOPLASTY;  Surgeon: Cherre Robins, MD;  Location: [place] [facility];  Service: Cardiovascular;;   Patient Active Problem List   Diagnosis Date Noted   Hemodialysis catheter dysfunction ([place]) 06/11/2021   Pressure injury of skin 06/10/2021   Bradycardia 06/09/2021   Paroxysmal atrial fibrillation ([place]) 06/09/2021   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 He is able to stand better. Still has pain mainly in left knee.  PAIN:  Are you having pain? Yes:  NPRS scale: 5/10 Pain location: right hip, left shoulder, right wrist, left knee Pain description: chronic, intermittent, aching Aggravating factors: Standing, pushing from armrest of chair, reaching overhead Relieving factors: Rest  PATIENT GOALS: Patient states he wants to be able to stand and walk on his own.   OBJECTIVE: (objective measures completed at initial evaluation unless otherwise dated) MUSCLE LENGTH: Significant limitation of hamstring muscle length   POSTURE: rounded shoulders, forward head, and decreased lumbar lordosis   LOWER EXTREMITY ROM: Limitation in bilateral knee extension, lacking approx 35-40 deg bilaterally   LOWER EXTREMITY MMT:   MMT Right eval Left eval  Hip flexion 4- 4-  Hip extension 3- 3-  Hip abduction 3- 3-  Knee flexion 4 4  Knee extension 4 4  FUNCTIONAL TESTS:  Sit to stand: patient requires significant use of BUE for support for sit to stand and controlled of decent 09/30/2021: patient still needs UE assist standing from standard chair, able to perform STS from elevated table without UE support   GAIT: - 09/28/2021 Distance walked: - Assistive device utilized: Rollator Level of assistance: Supervision Comments: knee flexion contracture, early toe off more on left, he is able to place more weight through LE rather than as much reliance on UE     TODAY'S TREATMENT: [facility] Adult  PT Treatment:                                                DATE: 09/30/2021 Therapeutic Exercise: NuStep L6 x 6 min with LE to improve strength, endurance, workload capacity, while taking subjective Sit to stand from elevated table with hands on thighs 2 x 10 Knee extension machine 20# 3 x 15 Knee flexion machine 25% 3 x 15 Side stepping in // bars with yellow at knees 2 x 2 lengths Standing without UE support 2 x 1 min Manual: Passive hamstring and hip flexor stretching Passive knee extension, hip extension and abduction   [facility] Adult PT Treatment:                                                DATE: 09/28/2021 Therapeutic Exercise: Recumbent bike L2 x 5 min to improve strength, endurance, workload capacity Knee extension machine 15# 3 x 15 Side stepping in at counter with yellow at knees 2 x 2 lengths Sit to stand from elevated table with hands on thighs 2 x 10 Manual: Passive hamstring and hip flexor stretching Passive knee extension, hip extension and abduction  [facility] Adult PT Treatment:                                                DATE: 09/16/2021 Therapeutic Exercise: Recumbent bike L2 x 5 min to improve strength, endurance, workload capacity Squat with BUE in // bars 2 x 10 Standing march in // bars 2 x 10 each - focus on maintaining stance leg knee extension Side stepping in // bars with yellow at knees x 2 lengths Knee extension machine 15# 3 x 10 Manual: Passive hamstring and hip flexor stretching Passive knee extension   PATIENT EDUCATION:  Education details: HEP Person educated: Patient Education method: Consulting civil engineer, Media planner, Corporate treasurer cues, Verbal cues Education comprehension: verbalized understanding, returned demonstration, verbal cues required, tactile cues required, and needs further education   HOME EXERCISE PROGRAM: Access Code: [email or web address]     ASSESSMENT: CLINICAL IMPRESSION: Patient tolerated therapy well with no adverse effects. He is progressing  well with therapy demonstrates overall improvement in LE strength and standing tolerance, exhibiting ability to stand without UE support for 1 min. He continues to exhibit limitations in knee and hip mobility that limit walking and standing, and gross strength deficits but overall is tolerating progressions in therapy. His left knee pain does limit his standing capacity as well. No changes to HEP, patient encouraged to work on stretching and standing tolerance. Patient would benefit  from continued skilled PT to progress strength and mobility in order to improving walking and maximize functional ability.     OBJECTIVE IMPAIRMENTS Abnormal gait, decreased activity tolerance, decreased balance, decreased mobility, difficulty walking, decreased ROM, decreased strength, impaired flexibility, postural dysfunction, and pain.    ACTIVITY LIMITATIONS standing, squatting, stairs, transfers, and locomotion level   PARTICIPATION LIMITATIONS: meal prep, cleaning, laundry, driving, shopping, community activity, occupation, and yard work   PERSONAL FACTORS Age, Fitness, Past/current experiences, Time since onset of injury/illness/exacerbation, Transportation, and 1 comorbidity: ESRD on hemodialysis  are also affecting patient's functional outcome.      GOALS: Goals reviewed with patient? Yes   SHORT TERM GOALS: Target date: 09/28/2021    Patient will be I with initial HEP in order to progress with therapy. Baseline: HEP provided at eval 09/30/2021: patient independent with initial HEP Goal status: MET   2.  Patient be able to perform sit to stand without UE support to indicate improved strength and functional mobility. Baseline: patient requires use of BUE 09/30/2021: patient able to perform STS from elevated table without UE support Goal status: PARTIALLY MET   3.  Patient will be able to stand >/= 1 min without UE support in order to improve balance and ability to perform ADLs Baseline: patient unable to  stand without UE support 09/30/2021: patient able to stand 1 min without UE support Goal status: MET   LONG TERM GOALS: Target date: 10/26/2021    Patient will be I with final HEP to maintain progress from PT. Baseline: HEP provided at eval Goal status: INITIAL   2.  Patient will be able to ambulate at mod I level using RW for 56f in order to improve independence with household ambulation. Baseline: Patient unable to ambulate Goal status: INITIAL   3.  Patient will exhibit bilateral knee extension lacking </= 20 deg in order to improve gait and standing ability Baseline: lacking approx 35-40 deg Goal status: INITIAL   4.  Patient will exhibit hip strength >/= 4-/5 MMT and knee strength >/= 4+/5 MMT in order to improve walking and standing tolerance. Baseline: patient exhibits deficits in hip and knee strength Goal status: INITIAL     PLAN: PT FREQUENCY: 1-2x/week   PT DURATION: 8 weeks   PLANNED INTERVENTIONS: Therapeutic exercises, Therapeutic activity, Neuromuscular re-education, Balance training, Gait training, Patient/Family education, Joint mobilization, Stair training, Aquatic Therapy, Cryotherapy, Moist heat, Manual therapy, and Re-evaluation   PLAN FOR NEXT SESSION: Review HEP and progress PRN, standing and gait training in // bars, gross LE strengthening, knee and hip  extension mobility and stretching    CHilda Blades PT, DPT, LAT, ATC 09/30/21  10:50 AM Phone: [phone]: [phone]

## 2021-09-30 ENCOUNTER — Ambulatory Visit: Payer: Medicare (Managed Care) | Admitting: Physical Therapy

## 2021-09-30 ENCOUNTER — Other Ambulatory Visit: Payer: Self-pay

## 2021-09-30 ENCOUNTER — Other Ambulatory Visit: Payer: Self-pay | Admitting: *Deleted

## 2021-09-30 ENCOUNTER — Encounter: Payer: Self-pay | Admitting: Physical Therapy

## 2021-09-30 DIAGNOSIS — M6281 Muscle weakness (generalized): Secondary | ICD-10-CM

## 2021-09-30 DIAGNOSIS — N186 End stage renal disease: Secondary | ICD-10-CM

## 2021-09-30 DIAGNOSIS — R2689 Other abnormalities of gait and mobility: Secondary | ICD-10-CM

## 2021-10-04 NOTE — Therapy (Signed)
OUTPATIENT PHYSICAL THERAPY TREATMENT NOTE   Patient Name: [name] MRN: [id number] DOB:May 12, 1950, 71 y.o., male Today's Date: 10/05/2021  PCP: Merryl Hacker, No   REFERRING PROVIDER: Claudia Desanctis, MD   END OF SESSION:   PT End of Session - 10/05/21 1018     Visit Number 8    Number of Visits 12    Date for PT Re-Evaluation 10/26/21    Authorization Type [facility] MEDICARE    Progress Note Due on Visit 10    PT Start Time 1000    PT Stop Time 1038    PT Time Calculation (min) 38 min    Activity Tolerance Patient tolerated treatment well    Behavior During Therapy WFL for tasks assessed/performed                   Past Medical History:  Diagnosis Date   A-fib ([place])    A-fib ([place])    Edema    ESRD (end stage renal disease) ([place])    MWF Emilie Rutter   History of insertion of tunneled central venous catheter (CVC) with port    Hypotension    Past Surgical History:  Procedure Laterality Date   A/V FISTULAGRAM Left 09/09/2021   Procedure: A/V Fistulagram;  Surgeon: Cherre Robins, MD;  Location: [place] [facility];  Service: Cardiovascular;  Laterality: Left;   AV FISTULA PLACEMENT Left 06/29/2021   Procedure: LEFT RADIOCEPHALIC ARTERIOVENOUS (AV) FISTULA CREATION;  Surgeon: Angelia Mould, MD;  Location: [facility] OR;  Service: Vascular;  Laterality: Left;   IR FLUORO GUIDE CV LINE RIGHT  06/04/2021   IR REMOVAL TUN CV CATH W/O FL  06/04/2021   IR US GUIDE VASC ACCESS RIGHT  06/04/2021   PERIPHERAL VASCULAR BALLOON ANGIOPLASTY  09/09/2021   Procedure: PERIPHERAL VASCULAR BALLOON ANGIOPLASTY;  Surgeon: Cherre Robins, MD;  Location: [place] [facility];  Service: Cardiovascular;;   Patient Active Problem List   Diagnosis Date Noted   Hemodialysis catheter dysfunction ([place]) 06/11/2021   Pressure injury of skin 06/10/2021   Bradycardia 06/09/2021   Paroxysmal atrial fibrillation ([place]) 06/09/2021   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 ([place]) 56/86/1683   Complication of vascular  dialysis catheter, initial encounter    Hyperkalemia     REFERRING DIAG: Mobility - indication deconditioning in ESRD patient  THERAPY DIAG:  Muscle weakness (generalized)  Other abnormalities of gait and mobility  Rationale for Evaluation and Treatment Rehabilitation  PERTINENT HISTORY: ESRD on hemodialysis, AV fistula placement,  PRECAUTIONS: Fall, patient currently non-ambulatory   SUBJECTIVE: Patient reports he had dialysis yesterday and since then has felt a little weaker and has had increased low back pain.  PAIN:  Are you having pain? Yes:  NPRS scale: 6/10 Pain location: right hip, left shoulder, right wrist, left knee Pain description: chronic, intermittent, aching Aggravating factors: Standing, pushing from armrest of chair, reaching overhead Relieving factors: Rest  PATIENT GOALS: Patient states he wants to be able to stand and walk on his own.   OBJECTIVE: (objective measures completed at initial evaluation unless otherwise dated) MUSCLE LENGTH: Significant limitation of hamstring muscle length   POSTURE: rounded shoulders, forward head, and decreased lumbar lordosis   LOWER EXTREMITY ROM: Limitation in bilateral knee extension, lacking approx 35-40 deg bilaterally   LOWER EXTREMITY MMT:   MMT Right eval Left eval  Hip flexion 4- 4-  Hip extension 3- 3-  Hip abduction 3- 3-  Knee flexion 4 4  Knee extension 4 4  FUNCTIONAL TESTS:  Sit to stand: patient requires significant use of BUE for support for sit to stand and controlled of decent 09/30/2021: patient still needs UE assist standing from standard chair, able to perform STS from elevated table without UE support   GAIT: - 09/28/2021 Distance walked: - Assistive device utilized: Rollator Level of assistance: Supervision Comments: knee flexion contracture, early toe off more on left, he is able to place more weight through LE rather than as much reliance on UE     TODAY'S TREATMENT: [facility]  Adult PT Treatment:                                                DATE: 10/05/2021 Therapeutic Exercise: NuStep L6 x 6 min with LE to improve strength, endurance, workload capacity, while taking subjective SLR 2 x 10 each Hooklying clamshell with green 2 x 15 Bridge 2 x 10 Hooklying alternating march 2 x 20 Sit to stand from elevated table with hands on thighs 2 x 10 Manual: Passive hamstring and hip flexor stretching Passive knee extension, hip extension and abduction   [facility] Adult PT Treatment:                                                DATE: 09/30/2021 Therapeutic Exercise: NuStep L6 x 6 min with LE to improve strength, endurance, workload capacity, while taking subjective Sit to stand from elevated table with hands on thighs 2 x 10 Knee extension machine 20# 3 x 15 Knee flexion machine 25% 3 x 15 Side stepping in // bars with yellow at knees 2 x 2 lengths Standing without UE support 2 x 1 min Manual: Passive hamstring and hip flexor stretching Passive knee extension, hip extension and abduction  [facility] Adult PT Treatment:                                                DATE: 09/28/2021 Therapeutic Exercise: Recumbent bike L2 x 5 min to improve strength, endurance, workload capacity Knee extension machine 15# 3 x 15 Side stepping in at counter with yellow at knees 2 x 2 lengths Sit to stand from elevated table with hands on thighs 2 x 10 Manual: Passive hamstring and hip flexor stretching Passive knee extension, hip extension and abduction   PATIENT EDUCATION:  Education details: HEP Person educated: Patient Education method: Consulting civil engineer, Demonstration, Corporate treasurer cues, Verbal cues Education comprehension: verbalized understanding, returned demonstration, verbal cues required, tactile cues required, and needs further education   HOME EXERCISE PROGRAM: Access Code: [email or web address]     ASSESSMENT: CLINICAL IMPRESSION: Patient tolerated therapy fair with no adverse effects. Therapy  exercise limited due to patient reporting increased back pain and weakness from dialysis so did not perform standing or walking exercises. He requested to leave early due to onset of chills. No changes to HEP, patient encouraged to work on exercises as able and contact his doctor if his symptoms persist. Patient would benefit from continued skilled PT to progress strength and mobility in order to improving walking and maximize functional ability.     OBJECTIVE IMPAIRMENTS Abnormal  gait, decreased activity tolerance, decreased balance, decreased mobility, difficulty walking, decreased ROM, decreased strength, impaired flexibility, postural dysfunction, and pain.    ACTIVITY LIMITATIONS standing, squatting, stairs, transfers, and locomotion level   PARTICIPATION LIMITATIONS: meal prep, cleaning, laundry, driving, shopping, community activity, occupation, and yard work   PERSONAL FACTORS Age, Fitness, Past/current experiences, Time since onset of injury/illness/exacerbation, Transportation, and 1 comorbidity: ESRD on hemodialysis  are also affecting patient's functional outcome.      GOALS: Goals reviewed with patient? Yes   SHORT TERM GOALS: Target date: 09/28/2021    Patient will be I with initial HEP in order to progress with therapy. Baseline: HEP provided at eval 09/30/2021: patient independent with initial HEP Goal status: MET   2.  Patient be able to perform sit to stand without UE support to indicate improved strength and functional mobility. Baseline: patient requires use of BUE 09/30/2021: patient able to perform STS from elevated table without UE support Goal status: PARTIALLY MET   3.  Patient will be able to stand >/= 1 min without UE support in order to improve balance and ability to perform ADLs Baseline: patient unable to stand without UE support 09/30/2021: patient able to stand 1 min without UE support Goal status: MET   LONG TERM GOALS: Target date: 10/26/2021    Patient  will be I with final HEP to maintain progress from PT. Baseline: HEP provided at eval Goal status: INITIAL   2.  Patient will be able to ambulate at mod I level using RW for 71f in order to improve independence with household ambulation. Baseline: Patient unable to ambulate Goal status: INITIAL   3.  Patient will exhibit bilateral knee extension lacking </= 20 deg in order to improve gait and standing ability Baseline: lacking approx 35-40 deg Goal status: INITIAL   4.  Patient will exhibit hip strength >/= 4-/5 MMT and knee strength >/= 4+/5 MMT in order to improve walking and standing tolerance. Baseline: patient exhibits deficits in hip and knee strength Goal status: INITIAL     PLAN: PT FREQUENCY: 1-2x/week   PT DURATION: 8 weeks   PLANNED INTERVENTIONS: Therapeutic exercises, Therapeutic activity, Neuromuscular re-education, Balance training, Gait training, Patient/Family education, Joint mobilization, Stair training, Aquatic Therapy, Cryotherapy, Moist heat, Manual therapy, and Re-evaluation   PLAN FOR NEXT SESSION: Review HEP and progress PRN, standing and gait training in // bars, gross LE strengthening, knee and hip  extension mobility and stretching    CHilda Blades PT, DPT, LAT, ATC 10/05/21  10:38 AM Phone: [phone]: [phone]

## 2021-10-05 ENCOUNTER — Ambulatory Visit: Payer: Medicare (Managed Care) | Admitting: Physical Therapy

## 2021-10-05 ENCOUNTER — Other Ambulatory Visit: Payer: Self-pay

## 2021-10-05 ENCOUNTER — Encounter: Payer: Self-pay | Admitting: Physical Therapy

## 2021-10-05 DIAGNOSIS — M6281 Muscle weakness (generalized): Secondary | ICD-10-CM | POA: Diagnosis not present

## 2021-10-05 DIAGNOSIS — R2689 Other abnormalities of gait and mobility: Secondary | ICD-10-CM

## 2021-10-07 ENCOUNTER — Ambulatory Visit: Payer: Medicare (Managed Care) | Admitting: Physical Therapy

## 2021-10-12 ENCOUNTER — Other Ambulatory Visit: Payer: Self-pay

## 2021-10-12 ENCOUNTER — Ambulatory Visit: Payer: Medicare (Managed Care) | Admitting: Physical Therapy

## 2021-10-12 ENCOUNTER — Encounter: Payer: Self-pay | Admitting: Physical Therapy

## 2021-10-12 DIAGNOSIS — M6281 Muscle weakness (generalized): Secondary | ICD-10-CM

## 2021-10-12 DIAGNOSIS — R2689 Other abnormalities of gait and mobility: Secondary | ICD-10-CM

## 2021-10-12 NOTE — Therapy (Signed)
OUTPATIENT PHYSICAL THERAPY TREATMENT NOTE   Patient Name: [name] MRN: [id number] DOB:13-Jul-1950, 71 y.o., male Today's Date: 10/12/2021  PCP: Merryl Hacker, No   REFERRING PROVIDER: Claudia Desanctis, MD   END OF SESSION:   PT End of Session - 10/12/21 1531     Visit Number 9    Number of Visits 12    Date for PT Re-Evaluation 10/26/21    Authorization Type [facility] MEDICARE    Progress Note Due on Visit 10    PT Start Time 4970    PT Stop Time 1615    PT Time Calculation (min) 45 min    Activity Tolerance Patient tolerated treatment well    Behavior During Therapy WFL for tasks assessed/performed                    Past Medical History:  Diagnosis Date   A-fib ([place])    A-fib ([place])    Edema    ESRD (end stage renal disease) ([place])    MWF Emilie Rutter   History of insertion of tunneled central venous catheter (CVC) with port    Hypotension    Past Surgical History:  Procedure Laterality Date   A/V FISTULAGRAM Left 09/09/2021   Procedure: A/V Fistulagram;  Surgeon: Cherre Robins, MD;  Location: [place] [facility];  Service: Cardiovascular;  Laterality: Left;   AV FISTULA PLACEMENT Left 06/29/2021   Procedure: LEFT RADIOCEPHALIC ARTERIOVENOUS (AV) FISTULA CREATION;  Surgeon: Angelia Mould, MD;  Location: [facility] OR;  Service: Vascular;  Laterality: Left;   IR FLUORO GUIDE CV LINE RIGHT  06/04/2021   IR REMOVAL TUN CV CATH W/O FL  06/04/2021   IR US GUIDE VASC ACCESS RIGHT  06/04/2021   PERIPHERAL VASCULAR BALLOON ANGIOPLASTY  09/09/2021   Procedure: PERIPHERAL VASCULAR BALLOON ANGIOPLASTY;  Surgeon: Cherre Robins, MD;  Location: [place] [facility];  Service: Cardiovascular;;   Patient Active Problem List   Diagnosis Date Noted   Hemodialysis catheter dysfunction ([place]) 06/11/2021   Pressure injury of skin 06/10/2021   Bradycardia 06/09/2021   Paroxysmal atrial fibrillation ([place]) 06/09/2021   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 ([place]) 26/37/8588   Complication of  vascular dialysis catheter, initial encounter    Hyperkalemia     REFERRING DIAG: Mobility - indication deconditioning in ESRD patient  THERAPY DIAG:  Muscle weakness (generalized)  Other abnormalities of gait and mobility  Rationale for Evaluation and Treatment Rehabilitation  PERTINENT HISTORY: ESRD on hemodialysis, AV fistula placement,  PRECAUTIONS: Fall, patient currently non-ambulatory   SUBJECTIVE: Patient reports he had dialysis yesterday and since then has felt a little weaker and has had increased low back pain.  PAIN:  Are you having pain? Yes:  NPRS scale: 6/10 Pain location: right hip, left shoulder, right wrist, left knee Pain description: chronic, intermittent, aching Aggravating factors: Standing, pushing from armrest of chair, reaching overhead Relieving factors: Rest  PATIENT GOALS: Patient states he wants to be able to stand and walk on his own.   OBJECTIVE: (objective measures completed at initial evaluation unless otherwise dated) MUSCLE LENGTH: Significant limitation of hamstring muscle length   POSTURE: rounded shoulders, forward head, and decreased lumbar lordosis   LOWER EXTREMITY ROM: Limitation in bilateral knee extension, lacking approx 35-40 deg bilaterally   LOWER EXTREMITY MMT:   MMT Right eval Left eval  Hip flexion 4- 4-  Hip extension 3- 3-  Hip abduction 3- 3-  Knee flexion 4 4  Knee extension 4 4  FUNCTIONAL TESTS:  Sit to stand: patient requires significant use of BUE for support for sit to stand and controlled of decent 09/30/2021: patient still needs UE assist standing from standard chair, able to perform STS from elevated table without UE support   GAIT: - 09/28/2021 Distance walked: - Assistive device utilized: Rollator Level of assistance: Supervision Comments: knee flexion contracture, early toe off more on left, he is able to place more weight through LE rather than as much reliance on UE     TODAY'S  TREATMENT: [facility] Adult PT Treatment:                                                DATE: 10/12/2021 Therapeutic Exercise: NuStep L6 x 5 min with LE to improve strength, endurance, workload capacity, while taking subjective Sit to stand from slightly elevated table without UE support 2 x 10 Knee extension machine 25# x 10, 30# 2 x 10 Knee flexion machine 30# 3 x 10 Standing heel raises in // bars 2 x 20 Sidestepping in // bars with yellow at knees 2 x 2 lengths Forward step-up 4" box in // bars x 10 each Manual: Passive hamstring and hip flexor stretching Passive knee extension, hip extension and abduction   [facility] Adult PT Treatment:                                                DATE: 10/05/2021 Therapeutic Exercise: NuStep L6 x 6 min with LE to improve strength, endurance, workload capacity, while taking subjective SLR 2 x 10 each Hooklying clamshell with green 2 x 15 Bridge 2 x 10 Hooklying alternating march 2 x 20 Sit to stand from elevated table with hands on thighs 2 x 10 Manual: Passive hamstring and hip flexor stretching Passive knee extension, hip extension and abduction  [facility] Adult PT Treatment:                                                DATE: 09/30/2021 Therapeutic Exercise: NuStep L6 x 6 min with LE to improve strength, endurance, workload capacity, while taking subjective Sit to stand from elevated table with hands on thighs 2 x 10 Knee extension machine 20# 3 x 15 Knee flexion machine 25% 3 x 15 Side stepping in // bars with yellow at knees 2 x 2 lengths Standing without UE support 2 x 1 min Manual: Passive hamstring and hip flexor stretching Passive knee extension, hip extension and abduction   PATIENT EDUCATION:  Education details: HEP Person educated: Patient Education method: Consulting civil engineer, Demonstration, Corporate treasurer cues, Verbal cues Education comprehension: verbalized understanding, returned demonstration, verbal cues required, tactile cues required, and needs  further education   HOME EXERCISE PROGRAM: Access Code: [email or web address]     ASSESSMENT: CLINICAL IMPRESSION: Patient tolerated therapy well with no adverse effects. Therapy continues to focus on progress his hip and knee mobility, and progressing strength and standing/walking tolerance. Patient was able to perform standing exercises again and incorporated step-ups with BUE support. He does continue to require UE support for standing exercises and cueing to reduce relaince on UE  to increase weight placed through his legs. No changes to HEP this visit. Patient would benefit from continued skilled PT to progress strength and mobility in order to improving walking and maximize functional ability.     OBJECTIVE IMPAIRMENTS Abnormal gait, decreased activity tolerance, decreased balance, decreased mobility, difficulty walking, decreased ROM, decreased strength, impaired flexibility, postural dysfunction, and pain.    ACTIVITY LIMITATIONS standing, squatting, stairs, transfers, and locomotion level   PARTICIPATION LIMITATIONS: meal prep, cleaning, laundry, driving, shopping, community activity, occupation, and yard work   PERSONAL FACTORS Age, Fitness, Past/current experiences, Time since onset of injury/illness/exacerbation, Transportation, and 1 comorbidity: ESRD on hemodialysis  are also affecting patient's functional outcome.      GOALS: Goals reviewed with patient? Yes   SHORT TERM GOALS: Target date: 09/28/2021    Patient will be I with initial HEP in order to progress with therapy. Baseline: HEP provided at eval 09/30/2021: patient independent with initial HEP Goal status: MET   2.  Patient be able to perform sit to stand without UE support to indicate improved strength and functional mobility. Baseline: patient requires use of BUE 09/30/2021: patient able to perform STS from elevated table without UE support Goal status: PARTIALLY MET   3.  Patient will be able to stand >/= 1 min without UE  support in order to improve balance and ability to perform ADLs Baseline: patient unable to stand without UE support 09/30/2021: patient able to stand 1 min without UE support Goal status: MET   LONG TERM GOALS: Target date: 10/26/2021    Patient will be I with final HEP to maintain progress from PT. Baseline: HEP provided at eval Goal status: INITIAL   2.  Patient will be able to ambulate at mod I level using RW for 19f in order to improve independence with household ambulation. Baseline: Patient unable to ambulate Goal status: INITIAL   3.  Patient will exhibit bilateral knee extension lacking </= 20 deg in order to improve gait and standing ability Baseline: lacking approx 35-40 deg Goal status: INITIAL   4.  Patient will exhibit hip strength >/= 4-/5 MMT and knee strength >/= 4+/5 MMT in order to improve walking and standing tolerance. Baseline: patient exhibits deficits in hip and knee strength Goal status: INITIAL     PLAN: PT FREQUENCY: 1-2x/week   PT DURATION: 8 weeks   PLANNED INTERVENTIONS: Therapeutic exercises, Therapeutic activity, Neuromuscular re-education, Balance training, Gait training, Patient/Family education, Joint mobilization, Stair training, Aquatic Therapy, Cryotherapy, Moist heat, Manual therapy, and Re-evaluation   PLAN FOR NEXT SESSION: Review HEP and progress PRN, standing and gait training in // bars, gross LE strengthening, knee and hip  extension mobility and stretching    CHilda Blades PT, DPT, LAT, ATC 10/12/21  4:18 PM Phone: [phone]: [phone]

## 2021-10-19 ENCOUNTER — Telehealth: Payer: Self-pay | Admitting: Physical Therapy

## 2021-10-19 ENCOUNTER — Encounter: Payer: Self-pay | Admitting: Physical Therapy

## 2021-10-19 ENCOUNTER — Encounter: Payer: Medicare (Managed Care) | Admitting: Physical Therapy

## 2021-10-19 ENCOUNTER — Ambulatory Visit: Payer: Medicare (Managed Care) | Attending: Family | Admitting: Physical Therapy

## 2021-10-19 DIAGNOSIS — R2689 Other abnormalities of gait and mobility: Secondary | ICD-10-CM | POA: Diagnosis present

## 2021-10-19 DIAGNOSIS — M6281 Muscle weakness (generalized): Secondary | ICD-10-CM | POA: Diagnosis present

## 2021-10-19 NOTE — Telephone Encounter (Signed)
Attempted to contact patient due to missed PT appointment. Informed patient of missed appointment, reminded of next scheduled appointment, and advised of attendance policy. Patient encouraged to contact PT office to reschedule his appointment.   Hilda Blades, PT, DPT, LAT, ATC 10/19/21  3:09 PM Phone: [phone] Fax: [phone]

## 2021-10-19 NOTE — Therapy (Signed)
Progress Note Reporting Period 6/13 to 7/25  See note below for Objective Data and Assessment of Progress/Goals.    Patient Name: [name] MRN: [id number] DOB:07-Sep-1950, 71 y.o., male Today's Date: 10/19/2021  PCP: Merryl Hacker, No   REFERRING PROVIDER: Claudia Desanctis, MD   END OF SESSION:   PT End of Session - 10/19/21 1612     Visit Number 10    Number of Visits 12    Date for PT Re-Evaluation 10/26/21    Authorization Type [facility] MEDICARE    Progress Note Due on Visit 10    Activity Tolerance Patient tolerated treatment well    Behavior During Therapy [facility] for tasks assessed/performed                    Past Medical History:  Diagnosis Date   A-fib ([place])    A-fib ([place])    Edema    ESRD (end stage renal disease) ([place])    MWF Emilie Rutter   History of insertion of tunneled central venous catheter (CVC) with port    Hypotension    Past Surgical History:  Procedure Laterality Date   A/V FISTULAGRAM Left 09/09/2021   Procedure: A/V Fistulagram;  Surgeon: Cherre Robins, MD;  Location: [place] [facility];  Service: Cardiovascular;  Laterality: Left;   AV FISTULA PLACEMENT Left 06/29/2021   Procedure: LEFT RADIOCEPHALIC ARTERIOVENOUS (AV) FISTULA CREATION;  Surgeon: Angelia Mould, MD;  Location: [facility] OR;  Service: Vascular;  Laterality: Left;   IR FLUORO GUIDE CV LINE RIGHT  06/04/2021   IR REMOVAL TUN CV CATH W/O FL  06/04/2021   IR US GUIDE VASC ACCESS RIGHT  06/04/2021   PERIPHERAL VASCULAR BALLOON ANGIOPLASTY  09/09/2021   Procedure: PERIPHERAL VASCULAR BALLOON ANGIOPLASTY;  Surgeon: Cherre Robins, MD;  Location: [place] CV LAB;  Service: Cardiovascular;;   Patient Active Problem List   Diagnosis Date Noted   Hemodialysis catheter dysfunction ([place]) 06/11/2021   Pressure injury of skin 06/10/2021   Bradycardia 06/09/2021   Paroxysmal atrial fibrillation ([place]) 06/09/2021   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 ([place]) 59/93/5701   Complication of vascular  dialysis catheter, initial encounter    Hyperkalemia     REFERRING DIAG: Mobility - indication deconditioning in ESRD patient  THERAPY DIAG:  Muscle weakness (generalized)  Other abnormalities of gait and mobility  Rationale for Evaluation and Treatment Rehabilitation  PERTINENT HISTORY: ESRD on hemodialysis, AV fistula placement,  PRECAUTIONS: Fall, patient currently non-ambulatory   SUBJECTIVE: Patient reports he had dialysis yesterday and since then has felt a little weaker and has had increased low back pain.  PAIN:  Are you having pain? Yes:  NPRS scale: 6/10 Pain location: right hip, left shoulder, right wrist, left knee Pain description: chronic, intermittent, aching Aggravating factors: Standing, pushing from armrest of chair, reaching overhead Relieving factors: Rest  PATIENT GOALS: Patient states he wants to be able to stand and walk on his own.   OBJECTIVE: (objective measures completed at initial evaluation unless otherwise dated) MUSCLE LENGTH: Significant limitation of hamstring muscle length   POSTURE: rounded shoulders, forward head, and decreased lumbar lordosis   LOWER EXTREMITY ROM: Limitation in bilateral knee extension, lacking approx 35-40 deg bilaterally   LOWER EXTREMITY MMT:   MMT Right eval Left eval R/L  Hip flexion 4- 4- 4/4-  Hip extension 3- 3-   Hip abduction 3- 3-   Knee flexion 4 4 4+/4+  Knee extension 4 4 4+/4+    FUNCTIONAL  TESTS:  Sit to stand: patient requires significant use of BUE for support for sit to stand and controlled of decent 09/30/2021: patient still needs UE assist standing from standard chair, able to perform STS from elevated table without UE support   GAIT: - 09/28/2021 Distance walked: - Assistive device utilized: Rollator Level of assistance: Supervision Comments: knee flexion contracture, early toe off more on left, he is able to place more weight through LE rather than as much reliance on UE      TODAY'S TREATMENT:  [facility] Adult PT Treatment:                                                DATE: 10/19/2021 Therapeutic Exercise: NuStep L6 x 5 min with LE to improve strength, endurance, workload capacity, while taking subjective Sit to stand from slightly elevated table without UE support 3x3 58 cm Supine bridge - 2x10 Standing heel raises in // bars 2 x 20 Discussed LLLD for knee Forward step-up 4" box in // bars x 10 each  Manual: Passive hamstring and hip flexor stretching Passive knee extension, hip extension and abduction  Therapeutic Activity - collecting information for goals, checking progress, and reviewing with patient  [facility] Adult PT Treatment:                                                DATE: 10/12/2021 Therapeutic Exercise: NuStep L6 x 5 min with LE to improve strength, endurance, workload capacity, while taking subjective Sit to stand from slightly elevated table without UE support 2 x 10 Knee extension machine 25# x 10, 30# 2 x 10 Knee flexion machine 30# 3 x 10 Standing heel raises in // bars 2 x 20 Sidestepping in // bars with yellow at knees 2 x 2 lengths Forward step-up 4" box in // bars x 10 each Manual: Passive hamstring and hip flexor stretching Passive knee extension, hip extension and abduction   [facility] Adult PT Treatment:                                                DATE: 10/05/2021 Therapeutic Exercise: NuStep L6 x 6 min with LE to improve strength, endurance, workload capacity, while taking subjective SLR 2 x 10 each Hooklying clamshell with green 2 x 15 Bridge 2 x 10 Hooklying alternating march 2 x 20 Sit to stand from elevated table with hands on thighs 2 x 10 Manual: Passive hamstring and hip flexor stretching Passive knee extension, hip extension and abduction  [facility] Adult PT Treatment:                                                DATE: 09/30/2021 Therapeutic Exercise: NuStep L6 x 6 min with LE to improve strength, endurance, workload  capacity, while taking subjective Sit to stand from elevated table with hands on thighs 2 x 10 Knee extension machine 20# 3 x 15 Knee flexion machine 25% 3 x 15  Side stepping in // bars with yellow at knees 2 x 2 lengths Standing without UE support 2 x 1 min Manual: Passive hamstring and hip flexor stretching Passive knee extension, hip extension and abduction   PATIENT EDUCATION:  Education details: HEP Person educated: Patient Education method: Consulting civil engineer, Demonstration, Corporate treasurer cues, Verbal cues Education comprehension: verbalized understanding, returned demonstration, verbal cues required, tactile cues required, and needs further education   HOME EXERCISE PROGRAM: Access Code: [email or web address]     ASSESSMENT: CLINICAL IMPRESSION: [place] has progressed well with therapy.  Improved impairments include: knee and hip ROM, knee and hip strength, gait.  Functional improvements include: ability to ambulate in community with rollator, transfers, subjective report of improved stability in gait.  Progressions needed include: continued work on knee and hip ROM, LE strength, and gait.  Barriers to progress include: chronic knee and hip flexion contracture.  Please see GOALS section for progress on short term and long term goals established at evaluation.  I recommend continuation of PT to allow completion of remaining goals and continued functional progression.     OBJECTIVE IMPAIRMENTS Abnormal gait, decreased activity tolerance, decreased balance, decreased mobility, difficulty walking, decreased ROM, decreased strength, impaired flexibility, postural dysfunction, and pain.    ACTIVITY LIMITATIONS standing, squatting, stairs, transfers, and locomotion level   PARTICIPATION LIMITATIONS: meal prep, cleaning, laundry, driving, shopping, community activity, occupation, and yard work   PERSONAL FACTORS Age, Fitness, Past/current experiences, Time since onset of injury/illness/exacerbation,  Transportation, and 1 comorbidity: ESRD on hemodialysis  are also affecting patient's functional outcome.      GOALS: Goals reviewed with patient? Yes   SHORT TERM GOALS: Target date: 09/28/2021    Patient will be I with initial HEP in order to progress with therapy. Baseline: HEP provided at eval 09/30/2021: patient independent with initial HEP Goal status: MET   2.  Patient be able to perform sit to stand without UE support to indicate improved strength and functional mobility. Baseline: patient requires use of BUE 09/30/2021: patient able to perform STS from elevated table without UE support Goal status: PARTIALLY MET   3.  Patient will be able to stand >/= 1 min without UE support in order to improve balance and ability to perform ADLs Baseline: patient unable to stand without UE support 09/30/2021: patient able to stand 1 min without UE support Goal status: MET   LONG TERM GOALS: Target date: 10/26/2021    Patient will be I with final HEP to maintain progress from PT. Baseline: HEP provided at eval Goal status: ongoing   2.  Patient will be able to ambulate at mod I level using RW for 38f in order to improve independence with household ambulation. Baseline: Patient unable to ambulate 8/1: able to ambulate with rollator Goal status: MET   3.  Patient will exhibit bilateral knee extension lacking </= 20 deg in order to improve gait and standing ability Baseline: lacking approx 35-40 deg 8/1: 23 Goal status: progressing   4.  Patient will exhibit hip strength >/= 4-/5 MMT and knee strength >/= 4+/5 MMT in order to improve walking and standing tolerance. Baseline: patient exhibits deficits in hip and knee strength 8/1: see chart Goal status: progressing     PLAN: PT FREQUENCY: 1-2x/week   PT DURATION: 8 weeks   PLANNED INTERVENTIONS: Therapeutic exercises, Therapeutic activity, Neuromuscular re-education, Balance training, Gait training, Patient/Family education, Joint  mobilization, Stair training, Aquatic Therapy, Cryotherapy, Moist heat, Manual therapy, and Re-evaluation   PLAN FOR NEXT SESSION: Review  HEP and progress PRN, standing and gait training in // bars, gross LE strengthening, knee and hip  extension mobility and stretching    Mathis Dad PT 10/19/21  5:00 PM Phone: [phone] Fax: [phone]

## 2021-10-21 ENCOUNTER — Encounter (HOSPITAL_COMMUNITY): Payer: Medicare (Managed Care)

## 2021-10-26 ENCOUNTER — Ambulatory Visit: Payer: Medicare (Managed Care) | Admitting: Physical Therapy

## 2021-11-01 NOTE — Therapy (Signed)
Progress Note Reporting Period 6/13 to 7/25  See note below for Objective Data and Assessment of Progress/Goals.    Patient Name: [name] MRN: [id number] DOB:1950/08/31, 71 y.o., male Today's Date: 11/01/2021  PCP: Merryl Hacker, No   REFERRING PROVIDER: Claudia Desanctis, MD   END OF SESSION:            Past Medical History:  Diagnosis Date   A-fib ([place])    A-fib ([place])    Edema    ESRD (end stage renal disease) ([place])    MWF Emilie Rutter   History of insertion of tunneled central venous catheter (CVC) with port    Hypotension    Past Surgical History:  Procedure Laterality Date   A/V FISTULAGRAM Left 09/09/2021   Procedure: A/V Fistulagram;  Surgeon: Cherre Robins, MD;  Location: [place] CV LAB;  Service: Cardiovascular;  Laterality: Left;   AV FISTULA PLACEMENT Left 06/29/2021   Procedure: LEFT RADIOCEPHALIC ARTERIOVENOUS (AV) FISTULA CREATION;  Surgeon: Angelia Mould, MD;  Location: [facility] OR;  Service: Vascular;  Laterality: Left;   IR FLUORO GUIDE CV LINE RIGHT  06/04/2021   IR REMOVAL TUN CV CATH W/O FL  06/04/2021   IR US GUIDE VASC ACCESS RIGHT  06/04/2021   PERIPHERAL VASCULAR BALLOON ANGIOPLASTY  09/09/2021   Procedure: PERIPHERAL VASCULAR BALLOON ANGIOPLASTY;  Surgeon: Cherre Robins, MD;  Location: [place] CV LAB;  Service: Cardiovascular;;   Patient Active Problem List   Diagnosis Date Noted   Hemodialysis catheter dysfunction ([place]) 06/11/2021   Pressure injury of skin 06/10/2021   Bradycardia 06/09/2021   Paroxysmal atrial fibrillation ([place]) 06/09/2021   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  Rationale for Evaluation and Treatment Rehabilitation  PERTINENT HISTORY: ESRD on hemodialysis, AV fistula placement,  PRECAUTIONS: Fall, patient  currently non-ambulatory   SUBJECTIVE: Patient reports he had dialysis yesterday and since then has felt a little weaker and has had increased low back pain.  PAIN:  Are you having pain? Yes:  NPRS scale: 6/10 Pain location: right hip, left shoulder, right wrist, left knee Pain description: chronic, intermittent, aching Aggravating factors: Standing, pushing from armrest of chair, reaching overhead Relieving factors: Rest  PATIENT GOALS: Patient states he wants to be able to stand and walk on his own.   OBJECTIVE: (objective measures completed at initial evaluation unless otherwise dated) MUSCLE LENGTH: Significant limitation of hamstring muscle length   POSTURE: rounded shoulders, forward head, and decreased lumbar lordosis   LOWER EXTREMITY ROM: Limitation in bilateral knee extension, lacking approx 35-40 deg bilaterally   LOWER EXTREMITY MMT:   MMT Right eval Left eval R/L  Hip flexion 4- 4- 4/4-  Hip extension 3- 3-   Hip abduction 3- 3-   Knee flexion 4 4 4+/4+  Knee extension 4 4 4+/4+    FUNCTIONAL TESTS:  Sit to stand: patient requires significant use of BUE for support for sit to stand and controlled of decent 09/30/2021: patient still needs UE assist standing from standard chair, able to perform STS from elevated table without UE support   GAIT: - 09/28/2021 Distance walked: - Assistive device utilized: Rollator Level of assistance: Supervision Comments: knee flexion contracture, early toe off more on left, he is able to place more weight through LE rather than as much reliance  on UE     TODAY'S TREATMENT: [place] Adult PT Treatment:                                                DATE: 11/02/2021 Therapeutic Exercise: NuStep L6 x 5 min with LE to improve strength, endurance, workload capacity, while taking subjective Sit to stand from slightly elevated table without UE support 2 x 10 Knee extension machine 25# x 10, 30# 2 x 10 Knee flexion machine 30# 3 x  10 Standing heel raises in // bars 2 x 20 Sidestepping in // bars with yellow at knees 2 x 2 lengths Forward step-up 4" box in // bars x 10 each Manual: Passive hamstring and hip flexor stretching Passive knee extension, hip extension and abduction   [facility] Adult PT Treatment:                                                DATE: 10/19/2021 Therapeutic Exercise: NuStep L6 x 5 min with LE to improve strength, endurance, workload capacity, while taking subjective Sit to stand from slightly elevated table without UE support 3x3 58 cm Supine bridge - 2x10 Standing heel raises in // bars 2 x 20 Discussed LLLD for knee Forward step-up 4" box in // bars x 10 each Manual: Passive hamstring and hip flexor stretching Passive knee extension, hip extension and abduction Therapeutic Activity - collecting information for goals, checking progress, and reviewing with patient  [facility] Adult PT Treatment:                                                DATE: 10/12/2021 Therapeutic Exercise: NuStep L6 x 5 min with LE to improve strength, endurance, workload capacity, while taking subjective Sit to stand from slightly elevated table without UE support 2 x 10 Knee extension machine 25# x 10, 30# 2 x 10 Knee flexion machine 30# 3 x 10 Standing heel raises in // bars 2 x 20 Sidestepping in // bars with yellow at knees 2 x 2 lengths Forward step-up 4" box in // bars x 10 each Manual: Passive hamstring and hip flexor stretching Passive knee extension, hip extension and abduction   PATIENT EDUCATION:  Education details: HEP Person educated: Patient Education method: Consulting civil engineer, Demonstration, Corporate treasurer cues, Verbal cues Education comprehension: verbalized understanding, returned demonstration, verbal cues required, tactile cues required, and needs further education   HOME EXERCISE PROGRAM: Access Code: [email or web address]     ASSESSMENT: CLINICAL IMPRESSION: Patient tolerated therapy well with no adverse effects.  *** Patient would benefit from continued skilled PT to progress strength and mobility in order to improving walking and maximize functional ability.  [name] has progressed well with therapy.  Improved impairments include: knee and hip ROM, knee and hip strength, gait.  Functional improvements include: ability to ambulate in community with rollator, transfers, subjective report of improved stability in gait.  Progressions needed include: continued work on knee and hip ROM, LE strength, and gait.  Barriers to progress include: chronic knee and hip flexion contracture.  Please see GOALS section for progress on short term and long  term goals established at evaluation.  I recommend continuation of PT to allow completion of remaining goals and continued functional progression.     OBJECTIVE IMPAIRMENTS Abnormal gait, decreased activity tolerance, decreased balance, decreased mobility, difficulty walking, decreased ROM, decreased strength, impaired flexibility, postural dysfunction, and pain.    ACTIVITY LIMITATIONS standing, squatting, stairs, transfers, and locomotion level   PARTICIPATION LIMITATIONS: meal prep, cleaning, laundry, driving, shopping, community activity, occupation, and yard work   PERSONAL FACTORS Age, Fitness, Past/current experiences, Time since onset of injury/illness/exacerbation, Transportation, and 1 comorbidity: ESRD on hemodialysis  are also affecting patient's functional outcome.      GOALS: Goals reviewed with patient? Yes   SHORT TERM GOALS: Target date: 09/28/2021    Patient will be I with initial HEP in order to progress with therapy. Baseline: HEP provided at eval 09/30/2021: patient independent with initial HEP Goal status: MET   2.  Patient be able to perform sit to stand without UE support to indicate improved strength and functional mobility. Baseline: patient requires use of BUE 09/30/2021: patient able to perform STS from elevated table without UE support Goal  status: PARTIALLY MET   3.  Patient will be able to stand >/= 1 min without UE support in order to improve balance and ability to perform ADLs Baseline: patient unable to stand without UE support 09/30/2021: patient able to stand 1 min without UE support Goal status: MET   LONG TERM GOALS: Target date: 10/26/2021    Patient will be I with final HEP to maintain progress from PT. Baseline: HEP provided at eval Goal status: ongoing   2.  Patient will be able to ambulate at mod I level using RW for 57f in order to improve independence with household ambulation. Baseline: Patient unable to ambulate 8/1: able to ambulate with rollator Goal status: MET   3.  Patient will exhibit bilateral knee extension lacking </= 20 deg in order to improve gait and standing ability Baseline: lacking approx 35-40 deg 8/1: 23 Goal status: progressing   4.  Patient will exhibit hip strength >/= 4-/5 MMT and knee strength >/= 4+/5 MMT in order to improve walking and standing tolerance. Baseline: patient exhibits deficits in hip and knee strength 8/1: see chart Goal status: progressing     PLAN: PT FREQUENCY: 1-2x/week   PT DURATION: 8 weeks   PLANNED INTERVENTIONS: Therapeutic exercises, Therapeutic activity, Neuromuscular re-education, Balance training, Gait training, Patient/Family education, Joint mobilization, Stair training, Aquatic Therapy, Cryotherapy, Moist heat, Manual therapy, and Re-evaluation   PLAN FOR NEXT SESSION: Review HEP and progress PRN, standing and gait training in // bars, gross LE strengthening, knee and hip  extension mobility and stretching    CHilda Blades PT, DPT, LAT, ATC 11/01/21  4:06 PM Phone: [phone]: [phone]

## 2021-11-02 ENCOUNTER — Other Ambulatory Visit: Payer: Self-pay

## 2021-11-02 ENCOUNTER — Encounter: Payer: Self-pay | Admitting: Physical Therapy

## 2021-11-02 ENCOUNTER — Ambulatory Visit: Payer: Medicare (Managed Care) | Admitting: Physical Therapy

## 2021-11-02 DIAGNOSIS — M6281 Muscle weakness (generalized): Secondary | ICD-10-CM

## 2021-11-02 DIAGNOSIS — R2689 Other abnormalities of gait and mobility: Secondary | ICD-10-CM

## 2021-11-02 NOTE — Patient Instructions (Signed)
Access Code: [facility] URL: [email or web address] Date: 11/02/2021 Prepared by: Hilda Blades  Exercises - Supine Bridge  - 1 x daily - 3 sets - 15 reps - Active Straight Leg Raise with Quad Set  - 1 x daily - 3 sets - 15 reps - Clamshell  - 1 x daily - 3 sets - 20 reps - Seated Hip Abduction with Resistance  - 1 x daily - 3 sets - 20 reps - Seated March with Resistance  - 1 x daily - 3 sets - 20 reps - Seated Knee Extension with Anchored Resistance  - 1 x daily - 3 sets - 15 reps - Mini Squat with Counter Support  - 1 x daily - 3 sets - 10 reps - Seated Hamstring Stretch  - 2-3 x daily - 3 sets - 30 seconds hold - Modified Thomas Stretch  - 2-3 x daily - 3 sets - 1 minute hold

## 2021-11-09 ENCOUNTER — Ambulatory Visit: Payer: Medicare (Managed Care) | Admitting: Physical Therapy

## 2021-11-09 ENCOUNTER — Other Ambulatory Visit: Payer: Self-pay

## 2021-11-09 ENCOUNTER — Encounter: Payer: Self-pay | Admitting: Physical Therapy

## 2021-11-09 DIAGNOSIS — R2689 Other abnormalities of gait and mobility: Secondary | ICD-10-CM

## 2021-11-09 DIAGNOSIS — M6281 Muscle weakness (generalized): Secondary | ICD-10-CM

## 2021-11-09 NOTE — Therapy (Signed)
  Patient Name: [name] MRN: [id number] DOB:12/21/1950, 71 y.o., male Today's Date: 11/09/2021  PCP: Pcp, No   REFERRING PROVIDER: Foster, Lori C, MD   END OF SESSION:   PT End of Session - 11/09/21 1214     Visit Number 12    Number of Visits 19    Date for PT Re-Evaluation 12/28/21    Authorization Type [employer]    Progress Note Due on Visit 20    PT Start Time 1215    PT Stop Time 1300    PT Time Calculation (min) 45 min    Activity Tolerance Patient tolerated treatment well    Behavior During Therapy WFL for tasks assessed/performed                      Past Medical History:  Diagnosis Date   A-fib (HCC)    A-fib (HCC)    Edema    ESRD (end stage renal disease) (HCC)    MWF [facility]   History of insertion of tunneled central venous catheter (CVC) with port    Hypotension    Past Surgical History:  Procedure Laterality Date   A/V FISTULAGRAM Left 09/09/2021   Procedure: A/V Fistulagram;  Surgeon: Hawken, Thomas N, MD;  Location: [facility];  Service: Cardiovascular;  Laterality: Left;   AV FISTULA PLACEMENT Left 06/29/2021   Procedure: LEFT RADIOCEPHALIC ARTERIOVENOUS (AV) FISTULA CREATION;  Surgeon: Dickson, Christopher S, MD;  Location: [facility] OR;  Service: Vascular;  Laterality: Left;   IR FLUORO GUIDE CV LINE RIGHT  06/04/2021   IR REMOVAL TUN CV CATH W/O FL  06/04/2021   IR US GUIDE VASC ACCESS RIGHT  06/04/2021   PERIPHERAL VASCULAR BALLOON ANGIOPLASTY  09/09/2021   Procedure: PERIPHERAL VASCULAR BALLOON ANGIOPLASTY;  Surgeon: Hawken, Thomas N, MD;  Location: [facility];  Service: Cardiovascular;;   Patient Active Problem List   Diagnosis Date Noted   Hemodialysis catheter dysfunction (HCC) 06/11/2021   Pressure injury of skin 06/10/2021   Bradycardia 06/09/2021   Paroxysmal atrial fibrillation (HCC) 06/09/2021   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 He reports cramps come and go mostly when he is taking dialysis.   PAIN:  Are you having pain? Yes:  NPRS scale: 5/10 Pain location: right hip, left shoulder, right wrist, left knee Pain description: chronic, intermittent, aching Aggravating factors: Standing, pushing from armrest of chair, reaching overhead Relieving factors: Rest  PATIENT GOALS: Patient states he wants to be able to stand and walk on his own.   OBJECTIVE: (objective measures completed at initial evaluation unless otherwise dated) MUSCLE LENGTH: Significant limitation of hamstring muscle length   POSTURE: rounded shoulders, forward head, and decreased lumbar lordosis   LOWER EXTREMITY ROM: Limitation in bilateral knee extension, lacking approx 35-40 deg bilaterally  11/02/2021: lacking approx 22-25 deg   LOWER EXTREMITY MMT:   MMT Right eval Left eval R/L Rt / Lt 11/02/2021  Hip flexion 4- 4- 4/4- 4- / 4-  Hip extension 3- 3-  3- / 3-  Hip   abduction 3- 3-  3 / 3  Knee flexion 4 4 4+/4+ 4+ / 4+  Knee extension 4 4 4+/4+ 4+ / 4+    FUNCTIONAL TESTS:  Sit to stand: patient requires significant use of BUE for support for sit to stand and controlled of decent 09/30/2021: patient still needs UE assist standing from standard chair, able to perform STS from elevated table without UE support 11/02/2021: patient still needs UE assist standing from standard chair, able to perform STS from elevated table without UE support 6 MWT: 595 ft, multiple standing rest breaks due to right wrist discomfort - 11/09/2021    GAIT: - 11/09/2021 Distance walked: 595 ft Assistive device utilized: Rollator Level of assistance: Mod I Comments: patient remains in knee flexion and hip flexion throughout, improved heel toe progression     TODAY'S TREATMENT: [facility] Adult PT Treatment:                                                DATE: 11/09/2021 Therapeutic Exercise: NuStep L8 x 8 min with LE to improve strength, endurance, workload capacity, while taking subjective Seated hamstring stretch with foot in chair 2 x 30 sec with pressure over knee into extension Sit to stand from slightly elevated table without UE support 2 x 10 6MWT for walking tolerance Side stepping in // bars with green at knees 2 x 2 lengths Forward marching with single UE support in // bars 2 x 2 lengths Manual: Passive hamstring and hip flexor stretching Passive knee extension, hip extension and abduction   [facility] Adult PT Treatment:                                                DATE: 11/02/2021 Therapeutic Exercise: NuStep L6 x 5 min with LE to improve strength, endurance, workload capacity, while taking subjective Sit to stand from slightly elevated table without UE support 2 x 10 Seated hamstring stretch 2 x 30 sec with pressure over knee into extension Modified thomas stretch 2 x 60 sec Manual: Passive hamstring and hip flexor stretching Passive knee extension, hip extension and abduction  [facility] Adult PT Treatment:                                                DATE: 10/19/2021 Therapeutic Exercise: NuStep L6 x 5 min with LE to improve strength, endurance, workload capacity, while taking subjective Sit to stand from slightly elevated table without UE support 3x3 58 cm Supine bridge - 2x10 Standing heel raises in // bars 2 x 20 Discussed LLLD for knee Forward step-up 4" box in // bars x 10 each Manual: Passive hamstring and hip flexor stretching Passive knee extension, hip extension and abduction Therapeutic Activity - collecting  information for goals, checking progress, and reviewing with patient   PATIENT EDUCATION:  Education details: HEP Person educated: Patient Education method: Consulting civil engineer, Demonstration, Corporate treasurer cues, Verbal cues Education comprehension: verbalized understanding, returned demonstration, verbal cues required, tactile cues required, and needs further education   HOME EXERCISE PROGRAM: Access Code: [email or web address]     ASSESSMENT: CLINICAL IMPRESSION: Patient  tolerated therapy well with no adverse effects. Therapy focused primarily on improve hip and knee mobility, and progression of strengthening to improve standing and walking tolerance. Performed 6MWT this visit and patient does demonstrate decreased ambulatory ability but this seemed to be more related to wrist pain using rollator rather than endurance or leg weakness. Patient does seem to be progressing well with therex targeting LE strengthening and standing and walking ability. He was encouraged to continue stretching at home to improve ability to attain improved posture. Patient would benefit from continued skilled PT to progress strength and mobility in order to improving walking and maximize functional ability.     OBJECTIVE IMPAIRMENTS Abnormal gait, decreased activity tolerance, decreased balance, decreased mobility, difficulty walking, decreased ROM, decreased strength, impaired flexibility, postural dysfunction, and pain.    ACTIVITY LIMITATIONS standing, squatting, stairs, transfers, and locomotion level   PARTICIPATION LIMITATIONS: meal prep, cleaning, laundry, driving, shopping, community activity, occupation, and yard work   PERSONAL FACTORS Age, Fitness, Past/current experiences, Time since onset of injury/illness/exacerbation, Transportation, and 1 comorbidity: ESRD on hemodialysis  are also affecting patient's functional outcome.      GOALS: Goals reviewed with patient? Yes   SHORT TERM GOALS: Target date: 11/30/2021   Patient  will be I with initial HEP in order to progress with therapy. Baseline: HEP provided at eval 09/30/2021: patient independent with initial HEP Goal status: MET   2.  Patient be able to perform sit to stand without UE support to indicate improved strength and functional mobility. Baseline: patient requires use of BUE 09/30/2021: patient able to perform STS from elevated table without UE support 11/02/2021: patient able to perform STS from elevated table without UE support Goal status: PARTIALLY MET   3.  Patient will be able to stand >/= 1 min without UE support in order to improve balance and ability to perform ADLs Baseline: patient unable to stand without UE support 09/30/2021: patient able to stand 1 min without UE support Goal status: MET   LONG TERM GOALS: Target date: 12/28/2021    Patient will be I with final HEP to maintain progress from PT. Baseline: HEP provided at eval 11/02/2021: progress HEP Goal status: PARTIALLY MET   2.  Patient will be able to ambulate at mod I level using RW for 52f in order to improve independence with household ambulation. Baseline: Patient unable to ambulate 8/1: able to ambulate with rollator 11/02/2021: patient able to ambulate 10+ ft with rollator Goal status: MET   3.  Patient will exhibit bilateral knee extension lacking </= 20 deg in order to improve gait and standing ability Baseline: lacking approx 35-40 deg 8/1: 23 11/02/2021: lacking approx 22-25 deg Goal status: PARTIALLY MET   4.  Patient will exhibit hip strength >/= 4-/5 MMT and knee strength >/= 4+/5 MMT in order to improve walking and standing tolerance. Baseline: patient exhibits deficits in hip and knee strength 8/1: see chart 11/02/2021: continues to exhibit gross strength deficits Goal status: PARTIALLY MET     PLAN: PT FREQUENCY: 1x/week   PT DURATION: 8 weeks   PLANNED INTERVENTIONS: Therapeutic exercises, Therapeutic activity, Neuromuscular re-education, Balance  training, Gait training, Patient/Family education, Joint mobilization, Stair training, Aquatic Therapy, Cryotherapy, Moist heat, Manual therapy, and Re-evaluation   PLAN FOR NEXT SESSION: Review HEP and progress PRN, standing and gait training in // bars, gross LE strengthening, knee and hip  extension mobility and stretching    CHilda Blades PT, DPT, LAT, ATC 11/09/21  1:12 PM Phone:  [phone] Fax: [phone]      

## 2021-11-11 ENCOUNTER — Ambulatory Visit (HOSPITAL_COMMUNITY): Payer: Medicare (Managed Care) | Attending: Vascular Surgery

## 2021-11-11 ENCOUNTER — Ambulatory Visit: Payer: Medicare (Managed Care) | Admitting: Physical Therapy

## 2021-11-11 DIAGNOSIS — I10 Essential (primary) hypertension: Secondary | ICD-10-CM | POA: Insufficient documentation

## 2021-11-11 DIAGNOSIS — N189 Chronic kidney disease, unspecified: Secondary | ICD-10-CM | POA: Insufficient documentation

## 2021-11-15 ENCOUNTER — Other Ambulatory Visit: Payer: Self-pay | Admitting: *Deleted

## 2021-11-15 DIAGNOSIS — N186 End stage renal disease: Secondary | ICD-10-CM

## 2021-11-18 ENCOUNTER — Encounter: Payer: Self-pay | Admitting: Physical Therapy

## 2021-11-18 ENCOUNTER — Ambulatory Visit: Payer: Medicare (Managed Care) | Admitting: Physical Therapy

## 2021-11-18 ENCOUNTER — Other Ambulatory Visit: Payer: Self-pay

## 2021-11-18 DIAGNOSIS — M6281 Muscle weakness (generalized): Secondary | ICD-10-CM

## 2021-11-18 DIAGNOSIS — R2689 Other abnormalities of gait and mobility: Secondary | ICD-10-CM

## 2021-11-18 NOTE — Therapy (Signed)
Patient Name: [name] MRN: [id number] DOB:[birth year], 71 y.o., male Today's Date: 11/18/2021  PCP: Pcp, No   REFERRING PROVIDER: Claudia Desanctis, MD   END OF SESSION:   PT End of Session - 11/18/21 1220     Visit Number 13    Number of Visits 19    Date for PT Re-Evaluation 12/28/21    Authorization Type [facility] MEDICARE    Progress Note Due on Visit 20    PT Start Time 1215    PT Stop Time 1300    PT Time Calculation (min) 45 min    Activity Tolerance Patient tolerated treatment well    Behavior During Therapy WFL for tasks assessed/performed                       Past Medical History:  Diagnosis Date   A-fib ([place])    A-fib ([place])    Edema    ESRD (end stage renal disease) ([place])    MWF [facility]   History of insertion of tunneled central venous catheter (CVC) with port    Hypotension    Shortness of breath 03/12/2021   Past Surgical History:  Procedure Laterality Date   A/V FISTULAGRAM Left 09/09/2021   Procedure: A/V Fistulagram;  Surgeon: Cherre Robins, MD;  Location: [place] [facility];  Service: Cardiovascular;  Laterality: Left;   AV FISTULA PLACEMENT Left 06/29/2021   Procedure: LEFT RADIOCEPHALIC ARTERIOVENOUS (AV) FISTULA CREATION;  Surgeon: Angelia Mould, MD;  Location: [facility] OR;  Service: Vascular;  Laterality: Left;   IR FLUORO GUIDE CV LINE RIGHT  06/04/2021   IR REMOVAL TUN CV CATH W/O FL  06/04/2021   IR US GUIDE VASC ACCESS RIGHT  06/04/2021   PERIPHERAL VASCULAR BALLOON ANGIOPLASTY  09/09/2021   Procedure: PERIPHERAL VASCULAR BALLOON ANGIOPLASTY;  Surgeon: Cherre Robins, MD;  Location: [place] [facility];  Service: Cardiovascular;;   Patient Active Problem List   Diagnosis Date Noted   Chronic kidney disease 11/11/2021   Hypertension 11/11/2021   Hemodialysis catheter dysfunction ([place]) 06/11/2021   Pressure injury of skin 06/10/2021   Bradycardia 06/09/2021   Paroxysmal atrial fibrillation ([place]) 06/09/2021   ESRD  on MWF hemodialysis ([place]) 77/82/4235   Complication of vascular dialysis catheter, initial encounter    Hyperkalemia    Unspecified protein-calorie malnutrition ([place]) 05/18/2021   Allergy, unspecified, initial encounter 03/12/2021   Anaphylactic shock, unspecified, initial encounter 03/12/2021   Anemia in chronic kidney disease 03/12/2021   Encounter for screening for respiratory tuberculosis 03/12/2021   Iron deficiency anemia, unspecified 03/12/2021   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   Secondary hyperparathyroidism of renal origin ([place]) 03/12/2021   Shortness of breath 03/12/2021   Type 2 diabetes mellitus with diabetic peripheral angiopathy without gangrene ([place]) 03/12/2021   Type 2 diabetes mellitus with other diabetic kidney complication ([place]) 36/14/4315   Acute metabolic encephalopathy 40/10/6759   Acute respiratory failure with hypoxia ([place]) 01/07/2021   Bacteremia due to other bacteria 01/07/2021   Pressure injury of right hip, unstageable ([place]) 01/07/2021   Septic shock ([place]) 12/27/2020   ESRD with anemia ([place]) 11/28/2020   Melena 10/15/2020   History of drug abuse ([place]) 01/07/2020   Tobacco abuse 01/07/2020   Muscle cramps 04/30/2012   Benign prostatic hyperplasia with urinary retention 06/01/2011   Acute renal failure ([place]) 05/29/2011   Hepatitis C 05/29/2011   Heroin withdrawal ([place]) 05/29/2011    REFERRING DIAG: Mobility - indication  deconditioning in ESRD patient  THERAPY DIAG:  Muscle weakness (generalized)  Other abnormalities of gait and mobility  Rationale for Evaluation and Treatment Rehabilitation  PERTINENT HISTORY: ESRD on hemodialysis, AV fistula placement,  PRECAUTIONS: Fall, patient currently non-ambulatory   SUBJECTIVE: Patient reports he is doing well with no new issues. He states he has been doing a little stretching at home.  PAIN:  Are you having pain? Yes:  NPRS scale: 5/10 Pain location: right hip, left shoulder,  right wrist, left knee Pain description: chronic, intermittent, aching Aggravating factors: Standing, pushing from armrest of chair, reaching overhead Relieving factors: Rest  PATIENT GOALS: Patient states he wants to be able to stand and walk on his own.   OBJECTIVE: (objective measures completed at initial evaluation unless otherwise dated) MUSCLE LENGTH: Significant limitation of hamstring muscle length   POSTURE: rounded shoulders, forward head, and decreased lumbar lordosis   LOWER EXTREMITY ROM: Limitation in bilateral knee extension, lacking approx 35-40 deg bilaterally  11/02/2021: lacking approx 22-25 deg   LOWER EXTREMITY MMT:   MMT Right eval Left eval R/L Rt / Lt 11/02/2021  Hip flexion 4- 4- 4/4- 4- / 4-  Hip extension 3- 3-  3- / 3-  Hip abduction 3- 3-  3 / 3  Knee flexion 4 4 4+/4+ 4+ / 4+  Knee extension 4 4 4+/4+ 4+ / 4+    FUNCTIONAL TESTS:  Sit to stand: patient requires significant use of BUE for support for sit to stand and controlled of decent 09/30/2021: patient still needs UE assist standing from standard chair, able to perform STS from elevated table without UE support 11/02/2021: patient still needs UE assist standing from standard chair, able to perform STS from elevated table without UE support 6 MWT: 595 ft, multiple standing rest breaks due to right wrist discomfort - 11/09/2021   GAIT: - 11/09/2021 Distance walked: 595 ft Assistive device utilized: Rollator Level of assistance: Mod I Comments: patient remains in knee flexion and hip flexion throughout, improved heel toe progression     TODAY'S TREATMENT: [facility] Adult PT Treatment:                                                DATE: 11/18/2021 Therapeutic Exercise: NuStep L8 x 8 min with LE to improve strength, endurance, workload capacity, while taking subjective Seated hamstring stretch with foot in chair 2 x 30 sec with pressure over knee into extension Sit to stand from slightly elevated  table without UE support 2 x 10 - cued for upright posture in standing Standing row with red 2 x 15 Side stepping at counter with green at knees 2 x 3 lengths down/back Manual: Passive hamstring and hip flexor stretching Passive knee extension, hip extension and abduction   [facility] Adult PT Treatment:                                                DATE: 11/09/2021 Therapeutic Exercise: NuStep L8 x 8 min with LE to improve strength, endurance, workload capacity, while taking subjective Seated hamstring stretch with foot in chair 2 x 30 sec with pressure over knee into extension Sit to stand from slightly elevated table without UE support 2 x 10  6MWT for walking tolerance Side stepping in // bars with green at knees 2 x 2 lengths Forward marching with single UE support in // bars 2 x 2 lengths Manual: Passive hamstring and hip flexor stretching Passive knee extension, hip extension and abduction  [facility] Adult PT Treatment:                                                DATE: 11/02/2021 Therapeutic Exercise: NuStep L6 x 5 min with LE to improve strength, endurance, workload capacity, while taking subjective Sit to stand from slightly elevated table without UE support 2 x 10 Seated hamstring stretch 2 x 30 sec with pressure over knee into extension Modified thomas stretch 2 x 60 sec Manual: Passive hamstring and hip flexor stretching Passive knee extension, hip extension and abduction   PATIENT EDUCATION:  Education details: HEP Person educated: Patient Education method: Consulting civil engineer, Media planner, Corporate treasurer cues, Verbal cues Education comprehension: verbalized understanding, returned demonstration, verbal cues required, tactile cues required, and needs further education   HOME EXERCISE PROGRAM: Access Code: [email or web address]     ASSESSMENT: CLINICAL IMPRESSION: Patient tolerated therapy well with no adverse effects. Therapy continues to focus on improving hip and knee mobility to improve ability  to stand with better posture, and LE and postural strengthening with good tolerance. He was able to perform more standing exercises without needing seated rest breaks and seems to be improving with his strength and walking ability. No changes made to HEP this visit. Patient would benefit from continued skilled PT to progress strength and mobility in order to improving walking and maximize functional ability.     OBJECTIVE IMPAIRMENTS Abnormal gait, decreased activity tolerance, decreased balance, decreased mobility, difficulty walking, decreased ROM, decreased strength, impaired flexibility, postural dysfunction, and pain.    ACTIVITY LIMITATIONS standing, squatting, stairs, transfers, and locomotion level   PARTICIPATION LIMITATIONS: meal prep, cleaning, laundry, driving, shopping, community activity, occupation, and yard work   PERSONAL FACTORS Age, Fitness, Past/current experiences, Time since onset of injury/illness/exacerbation, Transportation, and 1 comorbidity: ESRD on hemodialysis  are also affecting patient's functional outcome.      GOALS: Goals reviewed with patient? Yes   SHORT TERM GOALS: Target date: 11/30/2021   Patient will be I with initial HEP in order to progress with therapy. Baseline: HEP provided at eval 09/30/2021: patient independent with initial HEP Goal status: MET   2.  Patient be able to perform sit to stand without UE support to indicate improved strength and functional mobility. Baseline: patient requires use of BUE 09/30/2021: patient able to perform STS from elevated table without UE support 11/02/2021: patient able to perform STS from elevated table without UE support Goal status: PARTIALLY MET   3.  Patient will be able to stand >/= 1 min without UE support in order to improve balance and ability to perform ADLs Baseline: patient unable to stand without UE support 09/30/2021: patient able to stand 1 min without UE support Goal status: MET   LONG TERM  GOALS: Target date: 12/28/2021    Patient will be I with final HEP to maintain progress from PT. Baseline: HEP provided at eval 11/02/2021: progress HEP Goal status: PARTIALLY MET   2.  Patient will be able to ambulate at mod I level using RW for 35f in order to improve independence with household ambulation. Baseline: Patient unable to ambulate  8/1: able to ambulate with rollator 11/02/2021: patient able to ambulate 10+ ft with rollator Goal status: MET   3.  Patient will exhibit bilateral knee extension lacking </= 20 deg in order to improve gait and standing ability Baseline: lacking approx 35-40 deg 8/1: 23 11/02/2021: lacking approx 22-25 deg Goal status: PARTIALLY MET   4.  Patient will exhibit hip strength >/= 4-/5 MMT and knee strength >/= 4+/5 MMT in order to improve walking and standing tolerance. Baseline: patient exhibits deficits in hip and knee strength 8/1: see chart 11/02/2021: continues to exhibit gross strength deficits Goal status: PARTIALLY MET     PLAN: PT FREQUENCY: 1x/week   PT DURATION: 8 weeks   PLANNED INTERVENTIONS: Therapeutic exercises, Therapeutic activity, Neuromuscular re-education, Balance training, Gait training, Patient/Family education, Joint mobilization, Stair training, Aquatic Therapy, Cryotherapy, Moist heat, Manual therapy, and Re-evaluation   PLAN FOR NEXT SESSION: Review HEP and progress PRN, standing and gait training in // bars, gross LE strengthening, knee and hip  extension mobility and stretching    Hilda Blades, PT, DPT, LAT, ATC 11/18/21  1:20 PM Phone: [phone] Fax: [phone]

## 2021-11-21 NOTE — Progress Notes (Unsigned)
Dialysis Access Visit   History of Present Illness   [name] is a 71 y.o. year old male who presents for follow-up for: left AV fistulogram with Angioplasty of several areas along proximal forearm on 09/09/21 by Dr. Stanford Breed. This was performed secondary to poorly maturing left radiocephalic arteriovenous fistula. The patient notes no steal symptoms.   He is currently dialyzing via right IJ TDC on MWF at [facility] on [address]  Physical Examination   Vitals:   11/23/21 1448  BP: 102/61  Pulse: 82  Resp: 20  Temp: 98.6 F (37 C)  TempSrc: Temporal  SpO2: 96%  Weight: 154 lb 12.8 oz (70.2 kg)  Height: 6\' 1"  (1.854 m)   Body mass index is 20.42 kg/m.  left armleft 2+ radial pulse, hand grip is 5/5, sensation in digits is intact, palpable thrill but not very strong, bruit can be auscultated    Non invasive vascular study: Findings:  +--------------------+----------+-----------------+------------------------  ----+  AVF                 PSV (cm/s)Flow Vol (mL/min)          Comments              +--------------------+----------+-----------------+------------------------  ----+  Native artery inflow   119           40            Distal inflow  artery                                                        waveform improves  with                                                    proximal compression of  AVF.  +--------------------+----------+-----------------+------------------------  ----+  AVF Anastomosis        184                                                     +--------------------+----------+-----------------+------------------------  ----+      +------------+----------+-------------+----------+-------------------------  ----+  OUTFLOW VEINPSV (cm/s)Diameter (cm)Depth (cm)          Describe              +------------+----------+-------------+----------+-------------------------  ----+  Prox Forearm    60        0.59         0.21   connects to both radial  veins  +------------+----------+-------------+----------+-------------------------  ----+  Mid Forearm     65        0.52        0.13                                   +------------+----------+-------------+----------+-------------------------  ----+  Dist Forearm   336        0.28        0.17                                   +------------+----------+-------------+----------+-------------------------  ----+  Summary:  Arteriovenous fistula-Velocities less than 100cm/s noted.     Patent radial cephalic AVF in the Left forearm.   Medical Decision Making   [name] is a 71 y.o. year old male who presents s/p left AV fistulogram with Angioplasty of several areas along proximal forearm on 09/09/21 by Dr. Stanford Breed. This was performed secondary to poorly maturing left radiocephalic arteriovenous fistula. Patent is without signs or symptoms of steal syndrome. His duplex today still shows a poorly maturing AV fistula although it is patent. Clinically it is easily palpable with a thrill, but the thrill is not very strong in proximal forearm. Discussed duplex with Dr. Stanford Breed, who also examined patient, and we both suspect that the fistula will need to be converted to either upper arm fistula or forearm loop. However, we gave patient option to allow them to attempt cannulation of the fistula in 2 weeks to see if it will function with the understanding that if it does not that the next step will be surgical revision. The patient may follow up on a prn basis   Karoline Caldwell, PA-C [facility] Office: [facility] MD: Roxanne Mins

## 2021-11-23 ENCOUNTER — Encounter: Payer: Self-pay | Admitting: Physical Therapy

## 2021-11-23 ENCOUNTER — Ambulatory Visit (INDEPENDENT_AMBULATORY_CARE_PROVIDER_SITE_OTHER): Payer: Medicare HMO | Admitting: Physician Assistant

## 2021-11-23 ENCOUNTER — Other Ambulatory Visit: Payer: Self-pay

## 2021-11-23 ENCOUNTER — Ambulatory Visit (HOSPITAL_COMMUNITY)
Admission: RE | Admit: 2021-11-23 | Discharge: 2021-11-23 | Disposition: A | Discharge status: Home / Self Care | Payer: Medicare HMO | Source: Ambulatory Visit | Attending: Vascular Surgery | Admitting: Vascular Surgery

## 2021-11-23 ENCOUNTER — Ambulatory Visit: Payer: Medicare HMO | Attending: Family | Admitting: Physical Therapy

## 2021-11-23 VITALS — BP 102/61 | HR 82 | Temp 98.6°F | Resp 20 | Ht 73.0 in | Wt 154.8 lb

## 2021-11-23 DIAGNOSIS — Z992 Dependence on renal dialysis: Secondary | ICD-10-CM | POA: Diagnosis not present

## 2021-11-23 DIAGNOSIS — R2689 Other abnormalities of gait and mobility: Secondary | ICD-10-CM | POA: Diagnosis present

## 2021-11-23 DIAGNOSIS — N186 End stage renal disease: Secondary | ICD-10-CM

## 2021-11-23 DIAGNOSIS — M6281 Muscle weakness (generalized): Secondary | ICD-10-CM | POA: Diagnosis present

## 2021-11-23 NOTE — Therapy (Signed)
Patient Name: [name] MRN: [id number] DOB:1950-08-01, 71 y.o., male Today's Date: 11/23/2021  PCP: Pcp, No   REFERRING PROVIDER: Claudia Desanctis, MD   END OF SESSION:   PT End of Session - 11/23/21 1218     Visit Number 14    Number of Visits 19    Date for PT Re-Evaluation 12/28/21    Authorization Type [facility] MEDICARE    Progress Note Due on Visit 20    PT Start Time 1215    PT Stop Time 1300    PT Time Calculation (min) 45 min    Activity Tolerance Patient tolerated treatment well    Behavior During Therapy WFL for tasks assessed/performed                        Past Medical History:  Diagnosis Date   A-fib ([place])    A-fib ([place])    Edema    ESRD (end stage renal disease) ([place])    MWF [facility]   History of insertion of tunneled central venous catheter (CVC) with port    Hypotension    Shortness of breath 03/12/2021   Past Surgical History:  Procedure Laterality Date   A/V FISTULAGRAM Left 09/09/2021   Procedure: A/V Fistulagram;  Surgeon: Cherre Robins, MD;  Location: [facility];  Service: Cardiovascular;  Laterality: Left;   AV FISTULA PLACEMENT Left 06/29/2021   Procedure: LEFT RADIOCEPHALIC ARTERIOVENOUS (AV) FISTULA CREATION;  Surgeon: Angelia Mould, MD;  Location: [facility] OR;  Service: Vascular;  Laterality: Left;   IR FLUORO GUIDE CV LINE RIGHT  06/04/2021   IR REMOVAL TUN CV CATH W/O FL  06/04/2021   IR US GUIDE VASC ACCESS RIGHT  06/04/2021   PERIPHERAL VASCULAR BALLOON ANGIOPLASTY  09/09/2021   Procedure: PERIPHERAL VASCULAR BALLOON ANGIOPLASTY;  Surgeon: Cherre Robins, MD;  Location: [facility];  Service: Cardiovascular;;   Patient Active Problem List   Diagnosis Date Noted   Chronic kidney disease 11/11/2021   Hypertension 11/11/2021   Hemodialysis catheter dysfunction ([place]) 06/11/2021   Pressure injury of skin 06/10/2021   Bradycardia 06/09/2021   Paroxysmal atrial fibrillation ([place]) 06/09/2021   ESRD  on MWF hemodialysis ([place]) 69/62/9528   Complication of vascular dialysis catheter, initial encounter    Hyperkalemia    Unspecified protein-calorie malnutrition ([place]) 05/18/2021   Allergy, unspecified, initial encounter 03/12/2021   Anaphylactic shock, unspecified, initial encounter 03/12/2021   Anemia in chronic kidney disease 03/12/2021   Encounter for screening for respiratory tuberculosis 03/12/2021   Iron deficiency anemia, unspecified 03/12/2021   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   Secondary hyperparathyroidism of renal origin ([place]) 03/12/2021   Shortness of breath 03/12/2021   Type 2 diabetes mellitus with diabetic peripheral angiopathy without gangrene ([place]) 03/12/2021   Type 2 diabetes mellitus with other diabetic kidney complication ([place]) 41/32/4401   Acute metabolic encephalopathy 02/72/5366   Acute respiratory failure with hypoxia ([place]) 01/07/2021   Bacteremia due to other bacteria 01/07/2021   Pressure injury of right hip, unstageable ([place]) 01/07/2021   Septic shock ([place]) 12/27/2020   ESRD with anemia ([place]) 11/28/2020   Melena 10/15/2020   History of drug abuse ([place]) 01/07/2020   Tobacco abuse 01/07/2020   Muscle cramps 04/30/2012   Benign prostatic hyperplasia with urinary retention 06/01/2011   Acute renal failure ([place]) 05/29/2011   Hepatitis C 05/29/2011   Heroin withdrawal ([place]) 05/29/2011    REFERRING DIAG: Mobility -  indication deconditioning in ESRD patient  THERAPY DIAG:  Muscle weakness (generalized)  Other abnormalities of gait and mobility  Rationale for Evaluation and Treatment Rehabilitation  PERTINENT HISTORY: ESRD on hemodialysis, AV fistula placement,  PRECAUTIONS: Fall, patient currently non-ambulatory   SUBJECTIVE: Patient reports he is doing well with no new issues. He states he has been doing a little stretching at home.  PAIN:  Are you having pain? Yes:  NPRS scale: 5/10 Pain location: right hip, left shoulder,  right wrist, left knee Pain description: chronic, intermittent, aching Aggravating factors: Standing, pushing from armrest of chair, reaching overhead Relieving factors: Rest  PATIENT GOALS: Patient states he wants to be able to stand and walk on his own.   OBJECTIVE: (objective measures completed at initial evaluation unless otherwise dated) MUSCLE LENGTH: Significant limitation of hamstring muscle length   POSTURE: rounded shoulders, forward head, and decreased lumbar lordosis   LOWER EXTREMITY ROM: Limitation in bilateral knee extension, lacking approx 35-40 deg bilaterally  11/02/2021: lacking approx 22-25 deg   LOWER EXTREMITY MMT:   MMT Right eval Left eval R/L Rt / Lt 11/02/2021  Hip flexion 4- 4- 4/4- 4- / 4-  Hip extension 3- 3-  3- / 3-  Hip abduction 3- 3-  3 / 3  Knee flexion 4 4 4+/4+ 4+ / 4+  Knee extension 4 4 4+/4+ 4+ / 4+    FUNCTIONAL TESTS:  Sit to stand: patient requires significant use of BUE for support for sit to stand and controlled of decent 09/30/2021: patient still needs UE assist standing from standard chair, able to perform STS from elevated table without UE support 11/02/2021: patient still needs UE assist standing from standard chair, able to perform STS from elevated table without UE support 6 MWT: 595 ft, multiple standing rest breaks due to right wrist discomfort - 11/09/2021   GAIT: - 11/09/2021 Distance walked: 595 ft Assistive device utilized: Rollator Level of assistance: Mod I Comments: patient remains in knee flexion and hip flexion throughout, improved heel toe progression     TODAY'S TREATMENT: [facility] Adult PT Treatment:                                                DATE: 11/23/2021 Therapeutic Exercise: NuStep L8 x 8 min with LE to improve strength, endurance, workload capacity, while taking subjective Seated hamstring stretch with foot in chair 2 x 30 sec with pressure over knee into extension Sit to stand from slightly elevated table  without UE support 2 x 10 - cued for upright posture in standing Standing row with green 2 x 20 Standing heel raises 2 x 20 Standing hip abduction with green at knees 2 x 15 each Knee extension machine 15# 3 x 15 Manual: Passive hamstring and hip flexor stretching Passive knee extension, hip extension and abduction   [facility] Adult PT Treatment:                                                DATE: 11/18/2021 Therapeutic Exercise: NuStep L8 x 8 min with LE to improve strength, endurance, workload capacity, while taking subjective Seated hamstring stretch with foot in chair 2 x 30 sec with pressure over knee into extension Sit  to stand from slightly elevated table without UE support 2 x 10 - cued for upright posture in standing Standing row with red 2 x 15 Side stepping at counter with green at knees 2 x 3 lengths down/back Manual: Passive hamstring and hip flexor stretching Passive knee extension, hip extension and abduction  [facility] Adult PT Treatment:                                                DATE: 11/09/2021 Therapeutic Exercise: NuStep L8 x 8 min with LE to improve strength, endurance, workload capacity, while taking subjective Seated hamstring stretch with foot in chair 2 x 30 sec with pressure over knee into extension Sit to stand from slightly elevated table without UE support 2 x 10 6MWT for walking tolerance Side stepping in // bars with green at knees 2 x 2 lengths Forward marching with single UE support in // bars 2 x 2 lengths Manual: Passive hamstring and hip flexor stretching Passive knee extension, hip extension and abduction   PATIENT EDUCATION:  Education details: HEP Person educated: Patient Education method: Consulting civil engineer, Demonstration, Corporate treasurer cues, Verbal cues Education comprehension: verbalized understanding, returned demonstration, verbal cues required, tactile cues required, and needs further education   HOME EXERCISE PROGRAM: Access Code: [email or web address]      ASSESSMENT: CLINICAL IMPRESSION: Patient tolerated therapy well with no adverse effects. Therapy focused on continued progression of LE strength and hip/knee flexibility to allow for better standing posture. He does seem to be progressing well with strengthening and tolerating greater resistance with exercises. He does require cueing for postural control with standing. No changes made to HEP this visit. Patient would benefit from continued skilled PT to progress strength and mobility in order to improving walking and maximize functional ability.     OBJECTIVE IMPAIRMENTS Abnormal gait, decreased activity tolerance, decreased balance, decreased mobility, difficulty walking, decreased ROM, decreased strength, impaired flexibility, postural dysfunction, and pain.    ACTIVITY LIMITATIONS standing, squatting, stairs, transfers, and locomotion level   PARTICIPATION LIMITATIONS: meal prep, cleaning, laundry, driving, shopping, community activity, occupation, and yard work   PERSONAL FACTORS Age, Fitness, Past/current experiences, Time since onset of injury/illness/exacerbation, Transportation, and 1 comorbidity: ESRD on hemodialysis  are also affecting patient's functional outcome.      GOALS: Goals reviewed with patient? Yes   SHORT TERM GOALS: Target date: 11/30/2021   Patient will be I with initial HEP in order to progress with therapy. Baseline: HEP provided at eval 09/30/2021: patient independent with initial HEP Goal status: MET   2.  Patient be able to perform sit to stand without UE support to indicate improved strength and functional mobility. Baseline: patient requires use of BUE 09/30/2021: patient able to perform STS from elevated table without UE support 11/02/2021: patient able to perform STS from elevated table without UE support Goal status: PARTIALLY MET   3.  Patient will be able to stand >/= 1 min without UE support in order to improve balance and ability to perform  ADLs Baseline: patient unable to stand without UE support 09/30/2021: patient able to stand 1 min without UE support Goal status: MET   LONG TERM GOALS: Target date: 12/28/2021    Patient will be I with final HEP to maintain progress from PT. Baseline: HEP provided at eval 11/02/2021: progress HEP Goal status: PARTIALLY MET   2.  Patient will be able to ambulate at mod I level using RW for 44f in order to improve independence with household ambulation. Baseline: Patient unable to ambulate 8/1: able to ambulate with rollator 11/02/2021: patient able to ambulate 10+ ft with rollator Goal status: MET   3.  Patient will exhibit bilateral knee extension lacking </= 20 deg in order to improve gait and standing ability Baseline: lacking approx 35-40 deg 8/1: 23 11/02/2021: lacking approx 22-25 deg Goal status: PARTIALLY MET   4.  Patient will exhibit hip strength >/= 4-/5 MMT and knee strength >/= 4+/5 MMT in order to improve walking and standing tolerance. Baseline: patient exhibits deficits in hip and knee strength 8/1: see chart 11/02/2021: continues to exhibit gross strength deficits Goal status: PARTIALLY MET     PLAN: PT FREQUENCY: 1x/week   PT DURATION: 8 weeks   PLANNED INTERVENTIONS: Therapeutic exercises, Therapeutic activity, Neuromuscular re-education, Balance training, Gait training, Patient/Family education, Joint mobilization, Stair training, Aquatic Therapy, Cryotherapy, Moist heat, Manual therapy, and Re-evaluation   PLAN FOR NEXT SESSION: Review HEP and progress PRN, standing and gait training in // bars, gross LE strengthening, knee and hip  extension mobility and stretching    CHilda Blades PT, DPT, LAT, ATC 11/23/21  1:01 PM Phone: [phone]: [phone]

## 2021-12-01 ENCOUNTER — Ambulatory Visit: Payer: Medicare HMO | Admitting: Physical Therapy

## 2021-12-06 NOTE — Therapy (Signed)
Patient Name: [name] MRN: [id number] DOB:05-15-1950, 71 y.o., male Today's Date: 12/07/2021  PCP: Merryl Hacker, No   REFERRING PROVIDER: Claudia Desanctis, MD   END OF SESSION:   PT End of Session - 12/07/21 1215     Visit Number 15    Number of Visits 19    Date for PT Re-Evaluation 12/28/21    Authorization Type [facility] MEDICARE    Progress Note Due on Visit 20    PT Start Time 1215    PT Stop Time 1300    PT Time Calculation (min) 45 min    Activity Tolerance Patient tolerated treatment well    Behavior During Therapy WFL for tasks assessed/performed                         Past Medical History:  Diagnosis Date   A-fib ([place])    A-fib ([place])    Edema    ESRD (end stage renal disease) ([place])    MWF Emilie Rutter   History of insertion of tunneled central venous catheter (CVC) with port    Hypotension    Shortness of breath 03/12/2021   Past Surgical History:  Procedure Laterality Date   A/V FISTULAGRAM Left 09/09/2021   Procedure: A/V Fistulagram;  Surgeon: Cherre Robins, MD;  Location: [place] [facility];  Service: Cardiovascular;  Laterality: Left;   AV FISTULA PLACEMENT Left 06/29/2021   Procedure: LEFT RADIOCEPHALIC ARTERIOVENOUS (AV) FISTULA CREATION;  Surgeon: Angelia Mould, MD;  Location: [facility] OR;  Service: Vascular;  Laterality: Left;   IR FLUORO GUIDE CV LINE RIGHT  06/04/2021   IR REMOVAL TUN CV CATH W/O FL  06/04/2021   IR US GUIDE VASC ACCESS RIGHT  06/04/2021   PERIPHERAL VASCULAR BALLOON ANGIOPLASTY  09/09/2021   Procedure: PERIPHERAL VASCULAR BALLOON ANGIOPLASTY;  Surgeon: Cherre Robins, MD;  Location: [place] [facility];  Service: Cardiovascular;;   Patient Active Problem List   Diagnosis Date Noted   Chronic kidney disease 11/11/2021   Hypertension 11/11/2021   Hemodialysis catheter dysfunction ([place]) 06/11/2021   Pressure injury of skin 06/10/2021   Bradycardia 06/09/2021   Paroxysmal atrial fibrillation ([place]) 06/09/2021    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 ([place]) 65/68/1275   Complication of vascular dialysis catheter, initial encounter    Hyperkalemia    Unspecified protein-calorie malnutrition ([place]) 05/18/2021   Allergy, unspecified, initial encounter 03/12/2021   Anaphylactic shock, unspecified, initial encounter 03/12/2021   Anemia in chronic kidney disease 03/12/2021   Encounter for screening for respiratory tuberculosis 03/12/2021   Iron deficiency anemia, unspecified 03/12/2021   Other specified coagulation defects ([place]) 03/12/2021   Secondary hyperparathyroidism of renal origin ([place]) 03/12/2021   Shortness of breath 03/12/2021   Type 2 diabetes mellitus with diabetic peripheral angiopathy without gangrene ([place]) 03/12/2021   Type 2 diabetes mellitus with other diabetic kidney complication ([place]) 17/00/1749   Acute metabolic encephalopathy 44/96/7591   Acute respiratory failure with hypoxia ([place]) 01/07/2021   Bacteremia due to other bacteria 01/07/2021   Pressure injury of right hip, unstageable ([place]) 01/07/2021   Septic shock ([place]) 12/27/2020   ESRD with anemia ([place]) 11/28/2020   [place] 10/15/2020   History of drug abuse ([place]) 01/07/2020   Tobacco abuse 01/07/2020   Muscle cramps 04/30/2012   Benign prostatic hyperplasia with urinary retention 06/01/2011   Acute renal failure ([place]) 05/29/2011   Hepatitis C 05/29/2011   Heroin withdrawal ([place]) 05/29/2011    REFERRING DIAG: Mobility -  indication deconditioning in ESRD patient  THERAPY DIAG:  Muscle weakness (generalized)  Other abnormalities of gait and mobility  Rationale for Evaluation and Treatment Rehabilitation  PERTINENT HISTORY: ESRD on hemodialysis, AV fistula placement,  PRECAUTIONS: Fall, patient currently non-ambulatory   SUBJECTIVE: Patient reports he is doing alright. Patient reports he has been having some right hip and leg pain related to the wound he has.   PAIN:  Are you having pain? Yes:  NPRS scale: 5/10 Pain location:  right hip, left shoulder, right wrist, left knee Pain description: chronic, intermittent, aching Aggravating factors: Standing, pushing from armrest of chair, reaching overhead Relieving factors: Rest  PATIENT GOALS: Patient states he wants to be able to stand and walk on his own.   OBJECTIVE: (objective measures completed at initial evaluation unless otherwise dated) MUSCLE LENGTH: Significant limitation of hamstring muscle length   POSTURE: rounded shoulders, forward head, and decreased lumbar lordosis   LOWER EXTREMITY ROM: Limitation in bilateral knee extension, lacking approx 35-40 deg bilaterally  11/02/2021: lacking approx 22-25 deg   LOWER EXTREMITY MMT:   MMT Right eval Left eval R/L Rt / Lt 11/02/2021  Hip flexion 4- 4- 4/4- 4- / 4-  Hip extension 3- 3-  3- / 3-  Hip abduction 3- 3-  3 / 3  Knee flexion 4 4 4+/4+ 4+ / 4+  Knee extension 4 4 4+/4+ 4+ / 4+    FUNCTIONAL TESTS:  Sit to stand: patient requires significant use of BUE for support for sit to stand and controlled of decent 09/30/2021: patient still needs UE assist standing from standard chair, able to perform STS from elevated table without UE support 11/02/2021: patient still needs UE assist standing from standard chair, able to perform STS from elevated table without UE support 6 MWT: 595 ft, multiple standing rest breaks due to right wrist discomfort - 11/09/2021   GAIT: - 11/09/2021 Distance walked: 595 ft Assistive device utilized: Rollator Level of assistance: Mod I Comments: patient remains in knee flexion and hip flexion throughout, improved heel toe progression     TODAY'S TREATMENT: [facility] Adult PT Treatment:                                                DATE: 12/07/2021 Therapeutic Exercise: NuStep L8 x 8 min with UE/LE to improve strength, endurance, workload capacity, while taking subjective Standing heel raises 2 x 20 Standing hip abduction with green at knees in // bars 2 x 2 lengths  down/back Rockerboard sagittal and frontal plane taps x 2 min each Forward 6" step-ups x 10 each Standing romberg on Airex 2  x 1 min Bridge 2 x 10 SLR x 10 each Seated hamstring stretch with foot on stool 2 x 30 sec with pressure over knee into extension Sit to stand from slightly elevated table without UE support 2 x 10 - cued for upright posture in standing Knee extension machine 15# 2 x 15   [facility] Adult PT Treatment:                                                DATE: 11/23/2021 Therapeutic Exercise: NuStep L8 x 8 min with LE to improve strength, endurance, workload capacity, while  taking subjective Seated hamstring stretch with foot in chair 2 x 30 sec with pressure over knee into extension Sit to stand from slightly elevated table without UE support 2 x 10 - cued for upright posture in standing Standing row with green 2 x 20 Standing heel raises 2 x 20 Standing hip abduction with green at knees 2 x 15 each Knee extension machine 15# 3 x 15 Manual: Passive hamstring and hip flexor stretching Passive knee extension, hip extension and abduction  [facility] Adult PT Treatment:                                                DATE: 11/18/2021 Therapeutic Exercise: NuStep L8 x 8 min with LE to improve strength, endurance, workload capacity, while taking subjective Seated hamstring stretch with foot in chair 2 x 30 sec with pressure over knee into extension Sit to stand from slightly elevated table without UE support 2 x 10 - cued for upright posture in standing Standing row with red 2 x 15 Side stepping at counter with green at knees 2 x 3 lengths down/back Manual: Passive hamstring and hip flexor stretching Passive knee extension, hip extension and abduction   PATIENT EDUCATION:  Education details: HEP Person educated: Patient Education method: Consulting civil engineer, Demonstration, Corporate treasurer cues, Verbal cues Education comprehension: verbalized understanding, returned demonstration, verbal cues  required, tactile cues required, and needs further education   HOME EXERCISE PROGRAM: Access Code: [email or web address]     ASSESSMENT: CLINICAL IMPRESSION: Patient tolerated therapy well with no adverse effects. Therapy focused primarily on progression of strengthening and incorporated more standing stability exercises this visit with good tolerance. He reports right hip and leg pain related to a wound so did require rest breaks while performing standing exercises. Overall he seems to be improving in regard to standing and walking tolerance but does remain limited with hip and knee mobility limiting upright posture. No changes made to HEP this visit. Patient would benefit from continued skilled PT to progress strength and mobility in order to improving walking and maximize functional ability.     OBJECTIVE IMPAIRMENTS Abnormal gait, decreased activity tolerance, decreased balance, decreased mobility, difficulty walking, decreased ROM, decreased strength, impaired flexibility, postural dysfunction, and pain.    ACTIVITY LIMITATIONS standing, squatting, stairs, transfers, and locomotion level   PARTICIPATION LIMITATIONS: meal prep, cleaning, laundry, driving, shopping, community activity, occupation, and yard work   PERSONAL FACTORS Age, Fitness, Past/current experiences, Time since onset of injury/illness/exacerbation, Transportation, and 1 comorbidity: ESRD on hemodialysis  are also affecting patient's functional outcome.      GOALS: Goals reviewed with patient? Yes   SHORT TERM GOALS: Target date: 11/30/2021   Patient will be I with initial HEP in order to progress with therapy. Baseline: HEP provided at eval 09/30/2021: patient independent with initial HEP Goal status: MET   2.  Patient be able to perform sit to stand without UE support to indicate improved strength and functional mobility. Baseline: patient requires use of BUE 09/30/2021: patient able to perform STS from elevated table without  UE support 11/02/2021: patient able to perform STS from elevated table without UE support Goal status: PARTIALLY MET   3.  Patient will be able to stand >/= 1 min without UE support in order to improve balance and ability to perform ADLs Baseline: patient unable to stand without UE support 09/30/2021: patient  able to stand 1 min without UE support Goal status: MET   LONG TERM GOALS: Target date: 12/28/2021    Patient will be I with final HEP to maintain progress from PT. Baseline: HEP provided at eval 11/02/2021: progress HEP Goal status: PARTIALLY MET   2.  Patient will be able to ambulate at mod I level using RW for 40f in order to improve independence with household ambulation. Baseline: Patient unable to ambulate 8/1: able to ambulate with rollator 11/02/2021: patient able to ambulate 10+ ft with rollator Goal status: MET   3.  Patient will exhibit bilateral knee extension lacking </= 20 deg in order to improve gait and standing ability Baseline: lacking approx 35-40 deg 8/1: 23 11/02/2021: lacking approx 22-25 deg Goal status: PARTIALLY MET   4.  Patient will exhibit hip strength >/= 4-/5 MMT and knee strength >/= 4+/5 MMT in order to improve walking and standing tolerance. Baseline: patient exhibits deficits in hip and knee strength 8/1: see chart 11/02/2021: continues to exhibit gross strength deficits Goal status: PARTIALLY MET     PLAN: PT FREQUENCY: 1x/week   PT DURATION: 8 weeks   PLANNED INTERVENTIONS: Therapeutic exercises, Therapeutic activity, Neuromuscular re-education, Balance training, Gait training, Patient/Family education, Joint mobilization, Stair training, Aquatic Therapy, Cryotherapy, Moist heat, Manual therapy, and Re-evaluation   PLAN FOR NEXT SESSION: Review HEP and progress PRN, standing and gait training in // bars, gross LE strengthening, knee and hip  extension mobility and stretching    CHilda Blades PT, DPT, LAT, ATC 12/07/21  1:22  PM Phone: [phone]: [phone]

## 2021-12-07 ENCOUNTER — Other Ambulatory Visit: Payer: Self-pay

## 2021-12-07 ENCOUNTER — Encounter: Payer: Self-pay | Admitting: Physical Therapy

## 2021-12-07 ENCOUNTER — Ambulatory Visit: Payer: Medicare HMO | Admitting: Physical Therapy

## 2021-12-07 DIAGNOSIS — R2689 Other abnormalities of gait and mobility: Secondary | ICD-10-CM

## 2021-12-07 DIAGNOSIS — M6281 Muscle weakness (generalized): Secondary | ICD-10-CM

## 2021-12-07 NOTE — Progress Notes (Unsigned)
Cardiology Office Note:    Date:  12/07/2021   ID:  [name], DOB [birth year], MRN [id number]  PCP:  Arthur Holms, NP   [facility] Providers Cardiologist:  Lenna Sciara, MD Referring MD: Traci Sermon, [place]*   Chief Complaint/Reason for Referral: Cardiology follow-up  PATIENT DID NOT APPEAR FOR APPOINTMENT   ASSESSMENT:    Atrial fibrillation, unspecified type ([place])  ESRD on dialysis ([place])  Bradycardia  PLAN:    In order of problems listed above: 1.  Atrial fibrillation:  2.  End-stage renal disease:  3.  Bradycardia:    Dispo:  No follow-ups on file.        History of Present Illness:    FOCUSED PROBLEM LIST:   1.  Atrial fibrillation with a CHA2DS2-VASc score of 2 2.  End-stage renal disease on hemodialysis MWF 3.  On nocturnal oxygen  March 2023: The patient is a 71 y.o. male with the indicated medical history here to establish cardiovascular care.  He had recently moved here from [place].  He experienced a long hospitalization being treated for sepsis.  This his primary care provider here recently.  He has been doing fairly well.  He has some slight tenderness around his left chest dialysis access.  He denies any other types of chest pain.  He denies any significant shortness of breath.  He has had no peripheral edema or paroxysmal nocturnal dyspnea.  He denies any palpitations.  He has had no signs or symptoms of stroke.  He is required no emergency room visits or hospitalizations since his discharge from [place].  He has been otherwise well and without complaints.  He is tolerating dialysis well.  Plan: Stop amiodarone, increase Coreg to 12.5 mg twice daily, obtain TTE, and start Eliquis 5 mg twice daily.  Today: In the interim the patient was admitted shortly thereafter with bleeding from his hemodialysis access site.  He was found to be bradycardic at times in the emergency department.  His beta-blocker was decreased to 6.25 mg twice daily and the  patient left the hospital [place].  He also had his fistula revised.  An echocardiogram demonstrated reassuring findings with a preserved ejection fraction and no significant valvular abnormalities.            Current Medications: No outpatient medications have been marked as taking for the 12/09/21 encounter (Office Visit) with [name], MD.   Current Facility-Administered Medications for the 12/09/21 encounter (Office Visit) with [name], MD  Medication   0.9 %  sodium chloride infusion   sodium chloride flush (NS) 0.9 % injection 3 mL   sodium chloride flush (NS) 0.9 % injection 3 mL     Allergies:    Betadine [povidone iodine]   Social History:   Social History   Tobacco Use   Smoking status: Former    Types: Cigarettes    Passive exposure: Never  Scientific laboratory technician Use: Never used  Substance Use Topics   Alcohol use: Not Currently   Drug use: Not Currently     Family Hx: Family History  Problem Relation Age of Onset   Hypertension Mother    Diabetes Mother    Heart attack Father      Review of Systems:   Please see the history of present illness.    All other systems reviewed and are negative.     EKGs/Labs/Other Test Reviewed:    EKG: EKG today demonstrates sinus rhythm  Prior  CV studies:  TTE 2023:  1. Challenging to fully assess wall motion.. Left ventricular ejection  fraction, by estimation, is 50 to 55%. The left ventricle has low normal  function. The left ventricle demonstrates regional wall motion  abnormalities (see scoring diagram/findings  for description). There is mild left ventricular hypertrophy. Left  ventricular diastolic parameters are consistent with Grade I diastolic  dysfunction (impaired relaxation).   2. Right ventricular systolic function is normal. The right ventricular  size is normal. Tricuspid regurgitation signal is inadequate for assessing  PA pressure.   3. No evidence of mitral valve  regurgitation. Severe mitral annular  calcification.   4. The aortic valve is grossly normal. Aortic valve regurgitation is not  visualized.   5. The inferior vena cava is normal in size with greater than 50%  respiratory variability, suggesting right atrial pressure of 3 mmHg.   Imaging studies that I have independently reviewed today: TTE  Recent Labs: Creatinine 4.7 potassium 4.2 sodium 140 Labs November 2022  Risk Assessment/Calculations:     CHA2DS2-VASc Score = 2   This indicates a 2.2% annual risk of stroke. The patient's score is based upon: CHF History: 0 HTN History: 1 Diabetes History: 0 Stroke History: 0 Vascular Disease History: 0 Age Score: 1 Gender Score: 0         Signed, [name], MD  12/07/2021 8:44 AM    [facility] [address], [place], [place]  [address] Phone: [phone]; Fax: [phone]   Note:  This document was prepared using Dragon voice recognition software and may include unintentional dictation errors.

## 2021-12-09 ENCOUNTER — Ambulatory Visit (INDEPENDENT_AMBULATORY_CARE_PROVIDER_SITE_OTHER): Payer: Self-pay | Admitting: Internal Medicine

## 2021-12-09 DIAGNOSIS — R001 Bradycardia, unspecified: Secondary | ICD-10-CM

## 2021-12-09 DIAGNOSIS — N186 End stage renal disease: Secondary | ICD-10-CM

## 2021-12-09 DIAGNOSIS — Z992 Dependence on renal dialysis: Secondary | ICD-10-CM

## 2021-12-09 DIAGNOSIS — I4891 Unspecified atrial fibrillation: Secondary | ICD-10-CM

## 2021-12-15 NOTE — Therapy (Signed)
Patient Name: [name] MRN: [id number] DOB:[birth year], 71 y.o., male Today's Date: 12/16/2021  PCP: Pcp, No   REFERRING PROVIDER: Claudia Desanctis, MD   END OF SESSION:   PT End of Session - 12/16/21 1236     Visit Number 16    Number of Visits 19    Date for PT Re-Evaluation 12/28/21    Authorization Type [facility] MEDICARE    Progress Note Due on Visit 20    PT Start Time 1218    PT Stop Time 1300    PT Time Calculation (min) 42 min    Activity Tolerance Patient tolerated treatment well    Behavior During Therapy WFL for tasks assessed/performed                          Past Medical History:  Diagnosis Date   A-fib ([place])    A-fib ([place])    Edema    ESRD (end stage renal disease) ([place])    MWF Emilie Rutter   History of insertion of tunneled central venous catheter (CVC) with port    Hypotension    Shortness of breath 03/12/2021   Past Surgical History:  Procedure Laterality Date   A/V FISTULAGRAM Left 09/09/2021   Procedure: A/V Fistulagram;  Surgeon: Cherre Robins, MD;  Location: [place] [facility];  Service: Cardiovascular;  Laterality: Left;   AV FISTULA PLACEMENT Left 06/29/2021   Procedure: LEFT RADIOCEPHALIC ARTERIOVENOUS (AV) FISTULA CREATION;  Surgeon: Angelia Mould, MD;  Location: [facility] OR;  Service: Vascular;  Laterality: Left;   IR FLUORO GUIDE CV LINE RIGHT  06/04/2021   IR REMOVAL TUN CV CATH W/O FL  06/04/2021   IR US GUIDE VASC ACCESS RIGHT  06/04/2021   PERIPHERAL VASCULAR BALLOON ANGIOPLASTY  09/09/2021   Procedure: PERIPHERAL VASCULAR BALLOON ANGIOPLASTY;  Surgeon: Cherre Robins, MD;  Location: [place] [facility];  Service: Cardiovascular;;   Patient Active Problem List   Diagnosis Date Noted   Chronic kidney disease 11/11/2021   Hypertension 11/11/2021   Hemodialysis catheter dysfunction ([place]) 06/11/2021   Pressure injury of skin 06/10/2021   Bradycardia 06/09/2021   Paroxysmal atrial fibrillation ([place]) 06/09/2021    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 ([place]) 97/28/2060   Complication of vascular dialysis catheter, initial encounter    Hyperkalemia    Unspecified protein-calorie malnutrition ([place]) 05/18/2021   Allergy, unspecified, initial encounter 03/12/2021   Anaphylactic shock, unspecified, initial encounter 03/12/2021   Anemia in chronic kidney disease 03/12/2021   Encounter for screening for respiratory tuberculosis 03/12/2021   Iron deficiency anemia, unspecified 03/12/2021   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   Secondary hyperparathyroidism of renal origin ([place]) 03/12/2021   Shortness of breath 03/12/2021   Type 2 diabetes mellitus with diabetic peripheral angiopathy without gangrene ([place]) 03/12/2021   Type 2 diabetes mellitus with other diabetic kidney complication ([place]) 15/61/5379   Acute metabolic encephalopathy 43/27/6147   Acute respiratory failure with hypoxia ([place]) 01/07/2021   Bacteremia due to other bacteria 01/07/2021   Pressure injury of right hip, unstageable ([place]) 01/07/2021   Septic shock ([name]) 12/27/2020   ESRD with anemia ([place]) 11/28/2020   Melena 10/15/2020   History of drug abuse ([place]) 01/07/2020   Tobacco abuse 01/07/2020   Muscle cramps 04/30/2012   Benign prostatic hyperplasia with urinary retention 06/01/2011   Acute renal failure ([place]) 05/29/2011   Hepatitis C 05/29/2011   Heroin withdrawal ([place]) 05/29/2011    REFERRING DIAG:  Mobility - indication deconditioning in ESRD patient  THERAPY DIAG:  Muscle weakness (generalized)  Other abnormalities of gait and mobility  Rationale for Evaluation and Treatment Rehabilitation  PERTINENT HISTORY: ESRD on hemodialysis, AV fistula placement,  PRECAUTIONS: Fall, patient currently non-ambulatory   SUBJECTIVE: Patient reports he is doing well, no new issues.  PAIN:  Are you having pain? Yes:  NPRS scale: 5/10 Pain location: right hip, left shoulder, right wrist, left knee Pain description: chronic,  intermittent, aching Aggravating factors: Standing, pushing from armrest of chair, reaching overhead Relieving factors: Rest  PATIENT GOALS: Patient states he wants to be able to stand and walk on his own.   OBJECTIVE: (objective measures completed at initial evaluation unless otherwise dated) MUSCLE LENGTH: Significant limitation of hamstring muscle length   POSTURE: rounded shoulders, forward head, and decreased lumbar lordosis   LOWER EXTREMITY ROM: Limitation in bilateral knee extension, lacking approx 35-40 deg bilaterally  11/02/2021: lacking approx 22-25 deg   LOWER EXTREMITY MMT:   MMT Right eval Left eval R/L Rt / Lt 11/02/2021  Hip flexion 4- 4- 4/4- 4- / 4-  Hip extension 3- 3-  3- / 3-  Hip abduction 3- 3-  3 / 3  Knee flexion 4 4 4+/4+ 4+ / 4+  Knee extension 4 4 4+/4+ 4+ / 4+    FUNCTIONAL TESTS:  Sit to stand: patient requires significant use of BUE for support for sit to stand and controlled of decent 09/30/2021: patient still needs UE assist standing from standard chair, able to perform STS from elevated table without UE support 11/02/2021: patient still needs UE assist standing from standard chair, able to perform STS from elevated table without UE support 6 MWT: 595 ft, multiple standing rest breaks due to right wrist discomfort - 11/09/2021   GAIT: - 11/09/2021 Distance walked: 595 ft Assistive device utilized: Rollator Level of assistance: Mod I Comments: patient remains in knee flexion and hip flexion throughout, improved heel toe progression     TODAY'S TREATMENT: [facility] Adult PT Treatment:                                                DATE: 12/16/2021 Therapeutic Exercise: NuStep L8 x 5 min with UE/LE to improve strength, endurance, workload capacity, while taking subjective Seated hamstring stretch with foot on stool 2 x 30 sec with pressure over knee into extension Sit to stand from slightly elevated table without UE support 2 x 10 - cued for upright  posture in standing Standing heel raises 2 x 20 Standing hip abduction with green at knees in // bars 2 x 2 lengths down/back Forward 6" step-ups x 10 each Standing romberg on Airex 2 x 1 min Knee extension machine 20# x 20, 2 x 15 Manual: Passive hamstring and hip flexor stretching Passive knee extension, hip extension and abduction   [facility] Adult PT Treatment:                                                DATE: 12/07/2021 Therapeutic Exercise: NuStep L8 x 8 min with UE/LE to improve strength, endurance, workload capacity, while taking subjective Standing heel raises 2 x 20 Standing hip abduction with green at knees in //  bars 2 x 2 lengths down/back Rockerboard sagittal and frontal plane taps x 2 min each Forward 6" step-ups x 10 each Standing romberg on Airex 2  x 1 min Bridge 2 x 10 SLR x 10 each Seated hamstring stretch with foot on stool 2 x 30 sec with pressure over knee into extension Sit to stand from slightly elevated table without UE support 2 x 10 - cued for upright posture in standing Knee extension machine 15# 2 x 15  [facility] Adult PT Treatment:                                                DATE: 11/23/2021 Therapeutic Exercise: NuStep L8 x 8 min with LE to improve strength, endurance, workload capacity, while taking subjective Seated hamstring stretch with foot in chair 2 x 30 sec with pressure over knee into extension Sit to stand from slightly elevated table without UE support 2 x 10 - cued for upright posture in standing Standing row with green 2 x 20 Standing heel raises 2 x 20 Standing hip abduction with green at knees 2 x 15 each Knee extension machine 15# 3 x 15 Manual: Passive hamstring and hip flexor stretching Passive knee extension, hip extension and abduction   PATIENT EDUCATION:  Education details: HEP Person educated: Patient Education method: Consulting civil engineer, Demonstration, Corporate treasurer cues, Verbal cues Education comprehension: verbalized understanding,  returned demonstration, verbal cues required, tactile cues required, and needs further education   HOME EXERCISE PROGRAM: Access Code: [email or web address]     ASSESSMENT: CLINICAL IMPRESSION: Patient tolerated therapy well with no adverse effects. Therapy continues to focus on improving hip and knee flexibility and strengthening and standing stability. He demonstrates progression with his strengthening exercises tolerating increased weight and does seem to be improving with standing tolerance and stability. No changes made to HEP this visit. Patient would benefit from continued skilled PT to progress strength and mobility in order to improving walking and maximize functional ability.     OBJECTIVE IMPAIRMENTS Abnormal gait, decreased activity tolerance, decreased balance, decreased mobility, difficulty walking, decreased ROM, decreased strength, impaired flexibility, postural dysfunction, and pain.    ACTIVITY LIMITATIONS standing, squatting, stairs, transfers, and locomotion level   PARTICIPATION LIMITATIONS: meal prep, cleaning, laundry, driving, shopping, community activity, occupation, and yard work   PERSONAL FACTORS Age, Fitness, Past/current experiences, Time since onset of injury/illness/exacerbation, Transportation, and 1 comorbidity: ESRD on hemodialysis  are also affecting patient's functional outcome.      GOALS: Goals reviewed with patient? Yes   SHORT TERM GOALS: Target date: 11/30/2021   Patient will be I with initial HEP in order to progress with therapy. Baseline: HEP provided at eval 09/30/2021: patient independent with initial HEP Goal status: MET   2.  Patient be able to perform sit to stand without UE support to indicate improved strength and functional mobility. Baseline: patient requires use of BUE 09/30/2021: patient able to perform STS from elevated table without UE support 11/02/2021: patient able to perform STS from elevated table without UE support Goal status:  PARTIALLY MET   3.  Patient will be able to stand >/= 1 min without UE support in order to improve balance and ability to perform ADLs Baseline: patient unable to stand without UE support 09/30/2021: patient able to stand 1 min without UE support Goal status: MET   LONG TERM GOALS: Target  date: 12/28/2021    Patient will be I with final HEP to maintain progress from PT. Baseline: HEP provided at eval 11/02/2021: progress HEP Goal status: PARTIALLY MET   2.  Patient will be able to ambulate at mod I level using RW for 55f in order to improve independence with household ambulation. Baseline: Patient unable to ambulate 8/1: able to ambulate with rollator 11/02/2021: patient able to ambulate 10+ ft with rollator Goal status: MET   3.  Patient will exhibit bilateral knee extension lacking </= 20 deg in order to improve gait and standing ability Baseline: lacking approx 35-40 deg 8/1: 23 11/02/2021: lacking approx 22-25 deg Goal status: PARTIALLY MET   4.  Patient will exhibit hip strength >/= 4-/5 MMT and knee strength >/= 4+/5 MMT in order to improve walking and standing tolerance. Baseline: patient exhibits deficits in hip and knee strength 8/1: see chart 11/02/2021: continues to exhibit gross strength deficits Goal status: PARTIALLY MET     PLAN: PT FREQUENCY: 1x/week   PT DURATION: 8 weeks   PLANNED INTERVENTIONS: Therapeutic exercises, Therapeutic activity, Neuromuscular re-education, Balance training, Gait training, Patient/Family education, Joint mobilization, Stair training, Aquatic Therapy, Cryotherapy, Moist heat, Manual therapy, and Re-evaluation   PLAN FOR NEXT SESSION: Review HEP and progress PRN, standing and gait training in // bars, gross LE strengthening, knee and hip  extension mobility and stretching    CHilda Blades PT, DPT, LAT, ATC 12/16/21  1:02 PM Phone: [phone]: [phone]

## 2021-12-16 ENCOUNTER — Ambulatory Visit: Payer: Medicare HMO | Admitting: Physical Therapy

## 2021-12-16 ENCOUNTER — Encounter: Payer: Self-pay | Admitting: Physical Therapy

## 2021-12-16 ENCOUNTER — Other Ambulatory Visit: Payer: Self-pay

## 2021-12-16 DIAGNOSIS — M6281 Muscle weakness (generalized): Secondary | ICD-10-CM | POA: Diagnosis not present

## 2021-12-16 DIAGNOSIS — R2689 Other abnormalities of gait and mobility: Secondary | ICD-10-CM

## 2021-12-22 NOTE — Therapy (Signed)
Patient Name: [name] MRN: [id number] DOB:1950/09/11, 71 y.o., male Today's Date: 12/23/2021  PCP: Pcp, No   REFERRING PROVIDER: Claudia Desanctis, MD   END OF SESSION:   PT End of Session - 12/23/21 1151     Visit Number 17    Number of Visits 19    Date for PT Re-Evaluation 12/28/21    Authorization Type [facility] MEDICARE    Progress Note Due on Visit 20    PT Start Time 1130    PT Stop Time 1215    PT Time Calculation (min) 45 min    Activity Tolerance Patient tolerated treatment well    Behavior During Therapy WFL for tasks assessed/performed                           Past Medical History:  Diagnosis Date   A-fib ([place])    A-fib ([place])    Edema    ESRD (end stage renal disease) ([place])    MWF Emilie Rutter   History of insertion of tunneled central venous catheter (CVC) with port    Hypotension    Shortness of breath 03/12/2021   Past Surgical History:  Procedure Laterality Date   A/V FISTULAGRAM Left 09/09/2021   Procedure: A/V Fistulagram;  Surgeon: Cherre Robins, MD;  Location: [place] [facility];  Service: Cardiovascular;  Laterality: Left;   AV FISTULA PLACEMENT Left 06/29/2021   Procedure: LEFT RADIOCEPHALIC ARTERIOVENOUS (AV) FISTULA CREATION;  Surgeon: Angelia Mould, MD;  Location: [facility] OR;  Service: Vascular;  Laterality: Left;   IR FLUORO GUIDE CV LINE RIGHT  06/04/2021   IR REMOVAL TUN CV CATH W/O FL  06/04/2021   IR US GUIDE VASC ACCESS RIGHT  06/04/2021   PERIPHERAL VASCULAR BALLOON ANGIOPLASTY  09/09/2021   Procedure: PERIPHERAL VASCULAR BALLOON ANGIOPLASTY;  Surgeon: Cherre Robins, MD;  Location: [place] [facility];  Service: Cardiovascular;;   Patient Active Problem List   Diagnosis Date Noted   Chronic kidney disease 11/11/2021   Hypertension 11/11/2021   Hemodialysis catheter dysfunction ([place]) 06/11/2021   Pressure injury of skin 06/10/2021   Bradycardia 06/09/2021   Paroxysmal atrial fibrillation ([place]) 06/09/2021    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 ([place]) 15/83/0940   Complication of vascular dialysis catheter, initial encounter    Hyperkalemia    Unspecified protein-calorie malnutrition ([place]) 05/18/2021   Allergy, unspecified, initial encounter 03/12/2021   Anaphylactic shock, unspecified, initial encounter 03/12/2021   Anemia in chronic kidney disease 03/12/2021   Encounter for screening for respiratory tuberculosis 03/12/2021   Iron deficiency anemia, unspecified 03/12/2021   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   Secondary hyperparathyroidism of renal origin ([place]) 03/12/2021   Shortness of breath 03/12/2021   Type 2 diabetes mellitus with diabetic peripheral angiopathy without gangrene ([place]) 03/12/2021   Type 2 diabetes mellitus with other diabetic kidney complication ([place]) 76/80/8811   Acute metabolic encephalopathy 06/02/9456   Acute respiratory failure with hypoxia ([place]) 01/07/2021   Bacteremia due to other bacteria 01/07/2021   Pressure injury of right hip, unstageable ([place]) 01/07/2021   Septic shock ([place]) 12/27/2020   ESRD with anemia ([place]) 11/28/2020   Melena 10/15/2020   History of drug abuse ([place]) 01/07/2020   Tobacco abuse 01/07/2020   Muscle cramps 04/30/2012   Benign prostatic hyperplasia with urinary retention 06/01/2011   Acute renal failure ([place]) 05/29/2011   Hepatitis C 05/29/2011   Heroin withdrawal ([place]) 05/29/2011    REFERRING  DIAG: Mobility - indication deconditioning in ESRD patient  THERAPY DIAG:  Muscle weakness (generalized)  Other abnormalities of gait and mobility  Rationale for Evaluation and Treatment Rehabilitation  PERTINENT HISTORY: ESRD on hemodialysis, AV fistula placement,  PRECAUTIONS: Fall, patient currently non-ambulatory   SUBJECTIVE: Patient reports he is doing well, he got a cane and has tried using it but gets worn out. Notes improvement in knee pain this visit.  PAIN:  Are you having pain? Yes:  NPRS scale: 4/10 Pain  location: right hip, left shoulder, right wrist, left knee Pain description: chronic, intermittent, aching Aggravating factors: Standing, pushing from armrest of chair, reaching overhead Relieving factors: Rest  PATIENT GOALS: Patient states he wants to be able to stand and walk on his own.   OBJECTIVE: (objective measures completed at initial evaluation unless otherwise dated) MUSCLE LENGTH: Significant limitation of hamstring muscle length   POSTURE: rounded shoulders, forward head, and decreased lumbar lordosis   LOWER EXTREMITY ROM: Limitation in bilateral knee extension, lacking approx 35-40 deg bilaterally  11/02/2021: lacking approx 22-25 deg   LOWER EXTREMITY MMT:   MMT Right eval Left eval R/L Rt / Lt 11/02/2021  Hip flexion 4- 4- 4/4- 4- / 4-  Hip extension 3- 3-  3- / 3-  Hip abduction 3- 3-  3 / 3  Knee flexion 4 4 4+/4+ 4+ / 4+  Knee extension 4 4 4+/4+ 4+ / 4+    FUNCTIONAL TESTS:  Sit to stand: patient requires significant use of BUE for support for sit to stand and controlled of decent 09/30/2021: patient still needs UE assist standing from standard chair, able to perform STS from elevated table without UE support 11/02/2021: patient still needs UE assist standing from standard chair, able to perform STS from elevated table without UE support 6 MWT: 595 ft, multiple standing rest breaks due to right wrist discomfort - 11/09/2021   GAIT: - 11/09/2021 Distance walked: 595 ft Assistive device utilized: Rollator Level of assistance: Mod I Comments: patient remains in knee flexion and hip flexion throughout, improved heel toe progression     TODAY'S TREATMENT: [facility] Adult PT Treatment:                                                DATE: 12/23/2021 Therapeutic Exercise: NuStep L8 x 5 min with UE/LE to improve strength, endurance, workload capacity, while taking subjective Seated hamstring stretch with foot on stool 2 x 30 sec with pressure over knee into  extension Sit to stand from slightly elevated table without UE support x 10, holding 10# KB at chest x 10 Knee extension machine 20# 3 x 15 Knee flexion machine 25# 3 x 15 Standing heel raises 2 x 20 Standing hip abduction with green at knees in // bars 2 x 2 lengths down/back Manual: Passive hamstring and hip flexor stretching Passive knee extension, hip extension and abduction   [facility] Adult PT Treatment:                                                DATE: 12/16/2021 Therapeutic Exercise: NuStep L8 x 5 min with UE/LE to improve strength, endurance, workload capacity, while taking subjective Seated hamstring stretch with foot on stool  2 x 30 sec with pressure over knee into extension Sit to stand from slightly elevated table without UE support 2 x 10 - cued for upright posture in standing Standing heel raises 2 x 20 Standing hip abduction with green at knees in // bars 2 x 2 lengths down/back Forward 6" step-ups x 10 each Standing romberg on Airex 2 x 1 min Knee extension machine 20# x 20, 2 x 15 Manual: Passive hamstring and hip flexor stretching Passive knee extension, hip extension and abduction  [facility] Adult PT Treatment:                                                DATE: 12/07/2021 Therapeutic Exercise: NuStep L8 x 8 min with UE/LE to improve strength, endurance, workload capacity, while taking subjective Standing heel raises 2 x 20 Standing hip abduction with green at knees in // bars 2 x 2 lengths down/back Rockerboard sagittal and frontal plane taps x 2 min each Forward 6" step-ups x 10 each Standing romberg on Airex 2  x 1 min Bridge 2 x 10 SLR x 10 each Seated hamstring stretch with foot on stool 2 x 30 sec with pressure over knee into extension Sit to stand from slightly elevated table without UE support 2 x 10 - cued for upright posture in standing Knee extension machine 15# 2 x 15   PATIENT EDUCATION:  Education details: HEP Person educated: Patient Education  method: Consulting civil engineer, Demonstration, Corporate treasurer cues, Verbal cues Education comprehension: verbalized understanding, returned demonstration, verbal cues required, tactile cues required, and needs further education   HOME EXERCISE PROGRAM: Access Code: [email or web address]     ASSESSMENT: CLINICAL IMPRESSION: Patient tolerated therapy well with no adverse effects. He reports overall improvement in knee pain and mobility. Therapy continues to focus on progressing his strength with good tolerance. Patient is doing well ambulating with a rollator but states he would like to progress to using a cane and has tried at home but gets worn out. Will plan to reassess at next visit and determine wether to re-cert or discharge based on performance and patient goals. Patient would benefit from continued skilled PT to progress strength and mobility in order to improving walking and maximize functional ability.     OBJECTIVE IMPAIRMENTS Abnormal gait, decreased activity tolerance, decreased balance, decreased mobility, difficulty walking, decreased ROM, decreased strength, impaired flexibility, postural dysfunction, and pain.    ACTIVITY LIMITATIONS standing, squatting, stairs, transfers, and locomotion level   PARTICIPATION LIMITATIONS: meal prep, cleaning, laundry, driving, shopping, community activity, occupation, and yard work   PERSONAL FACTORS Age, Fitness, Past/current experiences, Time since onset of injury/illness/exacerbation, Transportation, and 1 comorbidity: ESRD on hemodialysis  are also affecting patient's functional outcome.      GOALS: Goals reviewed with patient? Yes   SHORT TERM GOALS: Target date: 11/30/2021   Patient will be I with initial HEP in order to progress with therapy. Baseline: HEP provided at eval 09/30/2021: patient independent with initial HEP Goal status: MET   2.  Patient be able to perform sit to stand without UE support to indicate improved strength and functional  mobility. Baseline: patient requires use of BUE 09/30/2021: patient able to perform STS from elevated table without UE support 11/02/2021: patient able to perform STS from elevated table without UE support Goal status: PARTIALLY MET   3.  Patient will be  able to stand >/= 1 min without UE support in order to improve balance and ability to perform ADLs Baseline: patient unable to stand without UE support 09/30/2021: patient able to stand 1 min without UE support Goal status: MET   LONG TERM GOALS: Target date: 12/28/2021    Patient will be I with final HEP to maintain progress from PT. Baseline: HEP provided at eval 11/02/2021: progress HEP Goal status: PARTIALLY MET   2.  Patient will be able to ambulate at mod I level using RW for 40f in order to improve independence with household ambulation. Baseline: Patient unable to ambulate 8/1: able to ambulate with rollator 11/02/2021: patient able to ambulate 10+ ft with rollator Goal status: MET   3.  Patient will exhibit bilateral knee extension lacking </= 20 deg in order to improve gait and standing ability Baseline: lacking approx 35-40 deg 8/1: 23 11/02/2021: lacking approx 22-25 deg Goal status: PARTIALLY MET   4.  Patient will exhibit hip strength >/= 4-/5 MMT and knee strength >/= 4+/5 MMT in order to improve walking and standing tolerance. Baseline: patient exhibits deficits in hip and knee strength 8/1: see chart 11/02/2021: continues to exhibit gross strength deficits Goal status: PARTIALLY MET     PLAN: PT FREQUENCY: 1x/week   PT DURATION: 8 weeks   PLANNED INTERVENTIONS: Therapeutic exercises, Therapeutic activity, Neuromuscular re-education, Balance training, Gait training, Patient/Family education, Joint mobilization, Stair training, Aquatic Therapy, Cryotherapy, Moist heat, Manual therapy, and Re-evaluation   PLAN FOR NEXT SESSION: Review HEP and progress PRN, standing and gait training in // bars, gross LE  strengthening, knee and hip  extension mobility and stretching    CHilda Blades PT, DPT, LAT, ATC 12/23/21  12:16 PM Phone: [phone]: [phone]

## 2021-12-23 ENCOUNTER — Encounter: Payer: Medicare (Managed Care) | Admitting: Physical Therapy

## 2021-12-23 ENCOUNTER — Other Ambulatory Visit: Payer: Self-pay

## 2021-12-23 ENCOUNTER — Ambulatory Visit: Payer: Medicare HMO | Attending: Family | Admitting: Physical Therapy

## 2021-12-23 ENCOUNTER — Encounter: Payer: Self-pay | Admitting: Physical Therapy

## 2021-12-23 DIAGNOSIS — R2689 Other abnormalities of gait and mobility: Secondary | ICD-10-CM | POA: Diagnosis present

## 2021-12-23 DIAGNOSIS — M6281 Muscle weakness (generalized): Secondary | ICD-10-CM | POA: Diagnosis present

## 2021-12-27 NOTE — Therapy (Signed)
  Patient Name: [name] MRN: [id number] DOB:11/21/1950, 71 y.o., male Today's Date: 12/27/2021  PCP: Pcp, No   REFERRING PROVIDER: Foster, Lori C, MD   END OF SESSION:                   Past Medical History:  Diagnosis Date   A-fib (HCC)    A-fib (HCC)    Edema    ESRD (end stage renal disease) (HCC)    MWF [facility]   History of insertion of tunneled central venous catheter (CVC) with port    Hypotension    Shortness of breath 03/12/2021   Past Surgical History:  Procedure Laterality Date   A/V FISTULAGRAM Left 09/09/2021   Procedure: A/V Fistulagram;  Surgeon: Hawken, Thomas N, MD;  Location: [facility];  Service: Cardiovascular;  Laterality: Left;   AV FISTULA PLACEMENT Left 06/29/2021   Procedure: LEFT RADIOCEPHALIC ARTERIOVENOUS (AV) FISTULA CREATION;  Surgeon: Dickson, Christopher S, MD;  Location: [facility] OR;  Service: Vascular;  Laterality: Left;   IR FLUORO GUIDE CV LINE RIGHT  06/04/2021   IR REMOVAL TUN CV CATH W/O FL  06/04/2021   IR US GUIDE VASC ACCESS RIGHT  06/04/2021   PERIPHERAL VASCULAR BALLOON ANGIOPLASTY  09/09/2021   Procedure: PERIPHERAL VASCULAR BALLOON ANGIOPLASTY;  Surgeon: Hawken, Thomas N, MD;  Location: [facility];  Service: Cardiovascular;;   Patient Active Problem List   Diagnosis Date Noted   Chronic kidney disease 11/11/2021   Hypertension 11/11/2021   Hemodialysis catheter dysfunction (HCC) 06/11/2021   Pressure injury of skin 06/10/2021   Bradycardia 06/09/2021   Paroxysmal atrial fibrillation (HCC) 06/09/2021   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) 06/09/2021   Complication of vascular dialysis catheter, initial encounter    Hyperkalemia    Unspecified protein-calorie malnutrition (HCC) 05/18/2021   Allergy, unspecified, initial encounter 03/12/2021   Anaphylactic shock, unspecified, initial encounter 03/12/2021   Anemia in chronic kidney disease 03/12/2021   Encounter for screening for respiratory  tuberculosis 03/12/2021   Iron deficiency anemia, unspecified 03/12/2021   Other specified coagulation defects (HCC) 03/12/2021   Secondary hyperparathyroidism of renal origin (HCC) 03/12/2021   Shortness of breath 03/12/2021   Type 2 diabetes mellitus with diabetic peripheral angiopathy without gangrene (HCC) 03/12/2021   Type 2 diabetes mellitus with other diabetic kidney complication (HCC) 03/12/2021   Acute metabolic encephalopathy 01/07/2021   Acute respiratory failure with hypoxia (HCC) 01/07/2021   Bacteremia due to other bacteria 01/07/2021   Pressure injury of right hip, unstageable (HCC) 01/07/2021   Septic shock (HCC) 12/27/2020   ESRD with anemia (HCC) 11/28/2020   Melena 10/15/2020   History of drug abuse (HCC) 01/07/2020   Tobacco abuse 01/07/2020   Muscle cramps 04/30/2012   Benign prostatic hyperplasia with urinary retention 06/01/2011   Acute renal failure (HCC) 05/29/2011   Hepatitis C 05/29/2011   Heroin withdrawal (HCC) 05/29/2011    REFERRING DIAG: Mobility - indication deconditioning in ESRD patient  THERAPY DIAG:  No diagnosis found.  Rationale for Evaluation and Treatment Rehabilitation  PERTINENT HISTORY: ESRD on hemodialysis, AV fistula placement,  PRECAUTIONS: Fall, patient currently non-ambulatory   SUBJECTIVE: Patient reports he is doing well, he got a cane and has tried using it but gets worn out. Notes improvement in knee pain this visit.  PAIN:  Are you having pain? Yes:  NPRS scale: 4/10 Pain location: right hip, left shoulder, right wrist, left knee Pain description: chronic, intermittent, aching Aggravating factors: Standing, pushing   from armrest of chair, reaching overhead Relieving factors: Rest  PATIENT GOALS: Patient states he wants to be able to stand and walk on his own.   OBJECTIVE: (objective measures completed at initial evaluation unless otherwise dated) MUSCLE LENGTH: Significant limitation of hamstring muscle length    POSTURE: rounded shoulders, forward head, and decreased lumbar lordosis   LOWER EXTREMITY ROM: Limitation in bilateral knee extension, lacking approx 35-40 deg bilaterally  11/02/2021: lacking approx 22-25 deg   LOWER EXTREMITY MMT:   MMT Right eval Left eval R/L Rt / Lt 11/02/2021  Hip flexion 4- 4- 4/4- 4- / 4-  Hip extension 3- 3-  3- / 3-  Hip abduction 3- 3-  3 / 3  Knee flexion 4 4 4+/4+ 4+ / 4+  Knee extension 4 4 4+/4+ 4+ / 4+    FUNCTIONAL TESTS:  Sit to stand: patient requires significant use of BUE for support for sit to stand and controlled of decent 09/30/2021: patient still needs UE assist standing from standard chair, able to perform STS from elevated table without UE support 11/02/2021: patient still needs UE assist standing from standard chair, able to perform STS from elevated table without UE support 6 MWT: 595 ft, multiple standing rest breaks due to right wrist discomfort - 11/09/2021   GAIT: - 11/09/2021 Distance walked: 595 ft Assistive device utilized: Rollator Level of assistance: Mod I Comments: patient remains in knee flexion and hip flexion throughout, improved heel toe progression     TODAY'S TREATMENT: [facility] Adult PT Treatment:                                                DATE: 12/28/2021 Therapeutic Exercise: NuStep L8 x 5 min with UE/LE to improve strength, endurance, workload capacity, while taking subjective Seated hamstring stretch with foot on stool 2 x 30 sec with pressure over knee into extension Sit to stand from slightly elevated table without UE support x 10, holding 10# KB at chest x 10 Knee extension machine 20# 3 x 15 Knee flexion machine 25# 3 x 15 Standing heel raises 2 x 20 Standing hip abduction with green at knees in // bars 2 x 2 lengths down/back Manual: Passive hamstring and hip flexor stretching Passive knee extension, hip extension and abduction   [facility] Adult PT Treatment:                                                 DATE: 12/23/2021 Therapeutic Exercise: NuStep L8 x 5 min with UE/LE to improve strength, endurance, workload capacity, while taking subjective Seated hamstring stretch with foot on stool 2 x 30 sec with pressure over knee into extension Sit to stand from slightly elevated table without UE support x 10, holding 10# KB at chest x 10 Knee extension machine 20# 3 x 15 Knee flexion machine 25# 3 x 15 Standing heel raises 2 x 20 Standing hip abduction with green at knees in // bars 2 x 2 lengths down/back Manual: Passive hamstring and hip flexor stretching Passive knee extension, hip extension and abduction  [facility] Adult PT Treatment:  DATE: 12/16/2021 Therapeutic Exercise: NuStep L8 x 5 min with UE/LE to improve strength, endurance, workload capacity, while taking subjective Seated hamstring stretch with foot on stool 2 x 30 sec with pressure over knee into extension Sit to stand from slightly elevated table without UE support 2 x 10 - cued for upright posture in standing Standing heel raises 2 x 20 Standing hip abduction with green at knees in // bars 2 x 2 lengths down/back Forward 6" step-ups x 10 each Standing romberg on Airex 2 x 1 min Knee extension machine 20# x 20, 2 x 15 Manual: Passive hamstring and hip flexor stretching Passive knee extension, hip extension and abduction   PATIENT EDUCATION:  Education details: HEP Person educated: Patient Education method: Explanation, Demonstration, Tactile cues, Verbal cues Education comprehension: verbalized understanding, returned demonstration, verbal cues required, tactile cues required, and needs further education   HOME EXERCISE PROGRAM: Access Code: [email or web address]     ASSESSMENT: CLINICAL IMPRESSION: Patient tolerated therapy well with no adverse effects. ***  Patient would benefit from continued skilled PT to progress strength and mobility in order to improving walking and maximize functional  ability.  He reports overall improvement in knee pain and mobility. Therapy continues to focus on progressing his strength with good tolerance. Patient is doing well ambulating with a rollator but states he would like to progress to using a cane and has tried at home but gets worn out. Will plan to reassess at next visit and determine wether to re-cert or discharge based on performance and patient goals.     OBJECTIVE IMPAIRMENTS Abnormal gait, decreased activity tolerance, decreased balance, decreased mobility, difficulty walking, decreased ROM, decreased strength, impaired flexibility, postural dysfunction, and pain.    ACTIVITY LIMITATIONS standing, squatting, stairs, transfers, and locomotion level   PARTICIPATION LIMITATIONS: meal prep, cleaning, laundry, driving, shopping, community activity, occupation, and yard work   PERSONAL FACTORS Age, Fitness, Past/current experiences, Time since onset of injury/illness/exacerbation, Transportation, and 1 comorbidity: ESRD on hemodialysis  are also affecting patient's functional outcome.      GOALS: Goals reviewed with patient? Yes   SHORT TERM GOALS: Target date: 11/30/2021   Patient will be I with initial HEP in order to progress with therapy. Baseline: HEP provided at eval 09/30/2021: patient independent with initial HEP Goal status: MET   2.  Patient be able to perform sit to stand without UE support to indicate improved strength and functional mobility. Baseline: patient requires use of BUE 09/30/2021: patient able to perform STS from elevated table without UE support 11/02/2021: patient able to perform STS from elevated table without UE support Goal status: PARTIALLY MET   3.  Patient will be able to stand >/= 1 min without UE support in order to improve balance and ability to perform ADLs Baseline: patient unable to stand without UE support 09/30/2021: patient able to stand 1 min without UE support Goal status: MET   LONG TERM GOALS:  Target date: 12/28/2021    Patient will be I with final HEP to maintain progress from PT. Baseline: HEP provided at eval 11/02/2021: progress HEP Goal status: PARTIALLY MET   2.  Patient will be able to ambulate at mod I level using RW for 50ft in order to improve independence with household ambulation. Baseline: Patient unable to ambulate 8/1: able to ambulate with rollator 11/02/2021: patient able to ambulate 10+ ft with rollator Goal status: MET   3.  Patient will exhibit bilateral knee extension lacking </= 20 deg in order   to improve gait and standing ability Baseline: lacking approx 35-40 deg 8/1: 23 11/02/2021: lacking approx 22-25 deg Goal status: PARTIALLY MET   4.  Patient will exhibit hip strength >/= 4-/5 MMT and knee strength >/= 4+/5 MMT in order to improve walking and standing tolerance. Baseline: patient exhibits deficits in hip and knee strength 8/1: see chart 11/02/2021: continues to exhibit gross strength deficits Goal status: PARTIALLY MET     PLAN: PT FREQUENCY: 1x/week   PT DURATION: 8 weeks   PLANNED INTERVENTIONS: Therapeutic exercises, Therapeutic activity, Neuromuscular re-education, Balance training, Gait training, Patient/Family education, Joint mobilization, Stair training, Aquatic Therapy, Cryotherapy, Moist heat, Manual therapy, and Re-evaluation   PLAN FOR NEXT SESSION: Review HEP and progress PRN, standing and gait training in // bars, gross LE strengthening, knee and hip  extension mobility and stretching    Hilda Blades, PT, DPT, LAT, ATC 12/27/21  11:21 AM Phone: [phone] Fax: [phone]

## 2021-12-28 ENCOUNTER — Ambulatory Visit: Payer: Medicare HMO | Admitting: Physical Therapy

## 2021-12-28 ENCOUNTER — Other Ambulatory Visit: Payer: Self-pay

## 2021-12-28 ENCOUNTER — Encounter: Payer: Self-pay | Admitting: Physical Therapy

## 2021-12-28 DIAGNOSIS — M6281 Muscle weakness (generalized): Secondary | ICD-10-CM

## 2021-12-28 DIAGNOSIS — R2689 Other abnormalities of gait and mobility: Secondary | ICD-10-CM

## 2022-01-06 ENCOUNTER — Ambulatory Visit: Payer: Medicare HMO | Admitting: Physical Therapy

## 2022-01-13 ENCOUNTER — Encounter: Payer: Self-pay | Admitting: Physical Therapy

## 2022-01-13 ENCOUNTER — Ambulatory Visit: Payer: Medicare HMO | Admitting: Physical Therapy

## 2022-01-13 ENCOUNTER — Other Ambulatory Visit: Payer: Self-pay

## 2022-01-13 DIAGNOSIS — R2689 Other abnormalities of gait and mobility: Secondary | ICD-10-CM

## 2022-01-13 DIAGNOSIS — M6281 Muscle weakness (generalized): Secondary | ICD-10-CM

## 2022-01-13 NOTE — Therapy (Signed)
Patient Name: [name] MRN: [id number] DOB:[birth year], 71 y.o., male Today's Date: 01/13/2022  PCP: Merryl Hacker, No   REFERRING PROVIDER: Claudia Desanctis, MD   END OF SESSION:   PT End of Session - 01/13/22 1218     Visit Number 19    Number of Visits 22    Date for PT Re-Evaluation 01/25/22    Authorization Type [facility] MEDICARE    Progress Note Due on Visit 20    PT Start Time 1215    PT Stop Time 1300    PT Time Calculation (min) 45 min    Activity Tolerance Patient tolerated treatment well    Behavior During Therapy WFL for tasks assessed/performed                      Past Medical History:  Diagnosis Date   A-fib ([place])    A-fib ([place])    Edema    ESRD (end stage renal disease) ([place])    MWF [facility]   History of insertion of tunneled central venous catheter (CVC) with port    Hypotension    Shortness of breath 03/12/2021   Past Surgical History:  Procedure Laterality Date   A/V FISTULAGRAM Left 09/09/2021   Procedure: A/V Fistulagram;  Surgeon: Cherre Robins, MD;  Location: [facility];  Service: Cardiovascular;  Laterality: Left;   AV FISTULA PLACEMENT Left 06/29/2021   Procedure: LEFT RADIOCEPHALIC ARTERIOVENOUS (AV) FISTULA CREATION;  Surgeon: Angelia Mould, MD;  Location: [facility] OR;  Service: Vascular;  Laterality: Left;   IR FLUORO GUIDE CV LINE RIGHT  06/04/2021   IR REMOVAL TUN CV CATH W/O FL  06/04/2021   IR US GUIDE VASC ACCESS RIGHT  06/04/2021   PERIPHERAL VASCULAR BALLOON ANGIOPLASTY  09/09/2021   Procedure: PERIPHERAL VASCULAR BALLOON ANGIOPLASTY;  Surgeon: Cherre Robins, MD;  Location: [facility];  Service: Cardiovascular;;   Patient Active Problem List   Diagnosis Date Noted   Chronic kidney disease 11/11/2021   Hypertension 11/11/2021   Hemodialysis catheter dysfunction ([place]) 06/11/2021   Pressure injury of skin 06/10/2021   Bradycardia 06/09/2021   Paroxysmal atrial fibrillation ([place]) 06/09/2021   ESRD on  MWF hemodialysis ([place]) 99/24/2683   Complication of vascular dialysis catheter, initial encounter    Hyperkalemia    Unspecified protein-calorie malnutrition ([place]) 05/18/2021   Allergy, unspecified, initial encounter 03/12/2021   Anaphylactic shock, unspecified, initial encounter 03/12/2021   Anemia in chronic kidney disease 03/12/2021   Encounter for screening for respiratory tuberculosis 03/12/2021   Iron deficiency anemia, unspecified 03/12/2021   Other specified coagulation defects ([place]) 03/12/2021   Secondary hyperparathyroidism of renal origin ([place]) 03/12/2021   Shortness of breath 03/12/2021   Type 2 diabetes mellitus with diabetic peripheral angiopathy without gangrene ([place]) 03/12/2021   Type 2 diabetes mellitus with other diabetic kidney complication ([place]) 41/96/2229   Acute metabolic encephalopathy 79/89/2119   Acute respiratory failure with hypoxia ([place]) 01/07/2021   Bacteremia due to other bacteria 01/07/2021   Pressure injury of right hip, unstageable ([place]) 01/07/2021   Septic shock ([place]) 12/27/2020   ESRD with anemia ([place]) 11/28/2020   Melena 10/15/2020   History of drug abuse ([place]) 01/07/2020   Tobacco abuse 01/07/2020   Muscle cramps 04/30/2012   Benign prostatic hyperplasia with urinary retention 06/01/2011   Acute renal failure ([place]) 05/29/2011   Hepatitis C 05/29/2011   Heroin withdrawal ([place]) 05/29/2011    REFERRING DIAG: Mobility - indication deconditioning  in ESRD patient  THERAPY DIAG:  Muscle weakness (generalized)  Other abnormalities of gait and mobility  Rationale for Evaluation and Treatment Rehabilitation  PERTINENT HISTORY: ESRD on hemodialysis, AV fistula placement,  PRECAUTIONS: Fall   SUBJECTIVE: Patient reports he is doing alright. He has been getting some pain in the back of his right leg.   PAIN:  Are you having pain? Yes:  NPRS scale: 4/10 Pain location: right hip, left shoulder, right wrist, left knee Pain description: chronic,  intermittent, aching Aggravating factors: Standing, pushing from armrest of chair, reaching overhead Relieving factors: Rest  PATIENT GOALS: Patient states he wants to be able to stand and walk on his own.   OBJECTIVE: (objective measures completed at initial evaluation unless otherwise dated) MUSCLE LENGTH: Significant limitation of hamstring muscle length   POSTURE: rounded shoulders, forward head, and decreased lumbar lordosis   LOWER EXTREMITY ROM: Limitation in bilateral knee extension, lacking approx 35-40 deg bilaterally  11/02/2021: lacking approx 22-25 deg  12/28/2021: lacking approx 20 deg   LOWER EXTREMITY MMT:   MMT Right eval Left eval R/L Rt / Lt 11/02/2021 Rt / Lt 12/28/2021  Hip flexion 4- 4- 4/4- 4- / 4- 4- / 4-  Hip extension 3- 3-  3- / 3- 3 / 3  Hip abduction 3- 3-  3 / 3 3 / 3  Knee flexion 4 4 4+/4+ 4+ / 4+ 4+ / 4+  Knee extension 4 4 4+/4+ 4+ / 4+ 4+ / 4+    FUNCTIONAL TESTS:  Sit to stand: patient requires significant use of BUE for support for sit to stand and controlled of decent 09/30/2021: patient still needs UE assist standing from standard chair, able to perform STS from elevated table without UE support 11/02/2021: patient still needs UE assist standing from standard chair, able to perform STS from elevated table without UE support 12/28/2021: patient still needs UE assist standing from standard chair, able to perform STS from elevated table without UE support 6 MWT: 595 ft, multiple standing rest breaks due to right wrist discomfort - 11/09/2021   GAIT: - 12/28/2021 Distance walked: 20 ft Assistive device utilized: Small based quad cane Level of assistance: Mod I Comments: patient remains in knee flexion and hip flexion throughout, improved heel toe progression, difficulty coordinating cane with stepping     TODAY'S TREATMENT: [facility] Adult PT Treatment:                                                DATE: 01/13/2022 Therapeutic Exercise: NuStep  L8 x 8 min with UE/LE to improve strength, endurance, workload capacity, while taking subjective Slant board calf stretch 3 x 30 sec Seated hamstring stretch with foot on stool 3 x 30 sec each with pressure over knee into extension Sit to stand from slightly elevated table without UE support 2 x 10 Knee extension machine 20# 3 x 15 Side stepping with green at knees in // bars 2 x 3 lengths down/back Forward marching in // bars x 1 length down back Manual: Passive hamstring and hip flexor stretching Passive knee extension, hip extension and abduction   [facility] Adult PT Treatment:  DATE: 12/28/2021 Therapeutic Exercise: NuStep L8 x 8 min with UE/LE to improve strength, endurance, workload capacity, while taking subjective Seated hamstring stretch with foot on stool 3 x 30 sec each with pressure over knee into extension Sit to stand from slightly elevated table without UE support x 10, holding 10# KB at chest x 10 Knee extension machine 20# 3 x 15 Standing heel raises 2 x 20 Standing hip abduction with green at knees in // bars 2 x 2 lengths down/back Forward marching in // bars x 1 length down back Gait Training: SPC in left and using bilat walking sticks, patient required cueing for proper coordination with cane Walking in // bars with single UE support   [facility] Adult PT Treatment:                                                DATE: 12/23/2021 Therapeutic Exercise: NuStep L8 x 5 min with UE/LE to improve strength, endurance, workload capacity, while taking subjective Seated hamstring stretch with foot on stool 2 x 30 sec with pressure over knee into extension Sit to stand from slightly elevated table without UE support x 10, holding 10# KB at chest x 10 Knee extension machine 20# 3 x 15 Knee flexion machine 25# 3 x 15 Standing heel raises 2 x 20 Standing hip abduction with green at knees in // bars 2 x 2 lengths down/back Manual: Passive  hamstring and hip flexor stretching Passive knee extension, hip extension and abduction   PATIENT EDUCATION:  Education details: HEP Person educated: Patient Education method: Consulting civil engineer, Demonstration, Corporate treasurer cues, Verbal cues Education comprehension: verbalized understanding, returned demonstration, verbal cues required, tactile cues required, and needs further education   HOME EXERCISE PROGRAM: Access Code: [email or web address]     ASSESSMENT: CLINICAL IMPRESSION: Patient tolerated therapy well with no adverse effects. Therapy continues to focus on improving mobility and LE flexibility and strength to improve his standing and walking ability with better posture. He continues to demonstrate greater limitations on the right side so used left hand for support with stepping to simulate using a cane. No changes to HEP this visit. Patient would benefit from continued skilled PT to progress strength and mobility in order to improving walking and maximize functional ability.     OBJECTIVE IMPAIRMENTS Abnormal gait, decreased activity tolerance, decreased balance, decreased mobility, difficulty walking, decreased ROM, decreased strength, impaired flexibility, postural dysfunction, and pain.    ACTIVITY LIMITATIONS standing, squatting, stairs, transfers, and locomotion level   PARTICIPATION LIMITATIONS: meal prep, cleaning, laundry, driving, shopping, community activity, occupation, and yard work   PERSONAL FACTORS Age, Fitness, Past/current experiences, Time since onset of injury/illness/exacerbation, Transportation, and 1 comorbidity: ESRD on hemodialysis  are also affecting patient's functional outcome.      GOALS: Goals reviewed with patient? Yes   SHORT TERM GOALS: Target date: 01/25/2022   Patient will be I with initial HEP in order to progress with therapy. Baseline: HEP provided at eval 09/30/2021: patient independent with initial HEP Goal status: MET   2.  Patient be able to perform sit to  stand without UE support to indicate improved strength and functional mobility. Baseline: patient requires use of BUE 09/30/2021: patient able to perform STS from elevated table without UE support 11/02/2021: patient able to perform STS from elevated table without UE support 12/28/2021: patient able to perform STS from  elevated table without UE support Goal status: PARTIALLY MET   3.  Patient will be able to stand >/= 1 min without UE support in order to improve balance and ability to perform ADLs Baseline: patient unable to stand without UE support 09/30/2021: patient able to stand 1 min without UE support Goal status: MET   LONG TERM GOALS: Target date: 01/25/2022   Patient will be I with final HEP to maintain progress from PT. Baseline: HEP provided at eval 11/02/2021: progress HEP 12/28/2021: progressing HEP Goal status: PARTIALLY MET   2.  Patient will be able to ambulate at mod I level using RW for 12f in order to improve independence with household ambulation. Baseline: Patient unable to ambulate 8/1: able to ambulate with rollator 11/02/2021: patient able to ambulate 10+ ft with rollator Goal status: MET   3.  Patient will exhibit bilateral knee extension lacking </= 20 deg in order to improve gait and standing ability Baseline: lacking approx 35-40 deg 8/1: 23 11/02/2021: lacking approx 22-25 deg 12/28/2021: lacking approx 20 deg Goal status: MET   4.  Patient will exhibit hip strength >/= 4-/5 MMT and knee strength >/= 4+/5 MMT in order to improve walking and standing tolerance. Baseline: patient exhibits deficits in hip and knee strength 8/1: see chart 11/02/2021: continues to exhibit gross strength deficits 12/28/2021: continues to exhibit gross strength deficits Goal status: PARTIALLY MET  5.  Patient will ambulate household distance using SPC to improve mobility. Baseline: patient able to ambulate 20 ft using SPC Goal status: INITIAL     PLAN: PT FREQUENCY:  1x/week   PT DURATION: 4 weeks   PLANNED INTERVENTIONS: Therapeutic exercises, Therapeutic activity, Neuromuscular re-education, Balance training, Gait training, Patient/Family education, Joint mobilization, Stair training, Aquatic Therapy, Cryotherapy, Moist heat, Manual therapy, and Re-evaluation   PLAN FOR NEXT SESSION: Review HEP and progress PRN, standing and gait training in // bars, gait training with quad cane, gross LE strengthening, knee and hip extension mobility and stretching    CHilda Blades PT, DPT, LAT, ATC 01/13/22  1:01 PM Phone: [phone]: [phone]

## 2022-01-19 NOTE — Therapy (Signed)
Progress Note Reporting Period 10/12/2021 to 01/20/2022  See note below for Objective Data and Assessment of Progress/Goals.      Patient Name: [name] MRN: [id number] DOB:1951/03/05, 71 y.o., male Today's Date: 01/20/2022  PCP: Merryl Hacker, No   REFERRING PROVIDER: Claudia Desanctis, MD   END OF SESSION:   PT End of Session - 01/20/22 1053     Visit Number 20    Number of Visits 22    Date for PT Re-Evaluation 01/25/22    Authorization Type [facility] MEDICARE    Progress Note Due on Visit 30    PT Start Time 1048    PT Stop Time 1130    PT Time Calculation (min) 42 min    Activity Tolerance Patient tolerated treatment well    Behavior During Therapy WFL for tasks assessed/performed                       Past Medical History:  Diagnosis Date   A-fib ([place])    A-fib ([place])    Edema    ESRD (end stage renal disease) ([place])    MWF Emilie Rutter   History of insertion of tunneled central venous catheter (CVC) with port    Hypotension    Shortness of breath 03/12/2021   Past Surgical History:  Procedure Laterality Date   A/V FISTULAGRAM Left 09/09/2021   Procedure: A/V Fistulagram;  Surgeon: Cherre Robins, MD;  Location: [facility];  Service: Cardiovascular;  Laterality: Left;   AV FISTULA PLACEMENT Left 06/29/2021   Procedure: LEFT RADIOCEPHALIC ARTERIOVENOUS (AV) FISTULA CREATION;  Surgeon: Angelia Mould, MD;  Location: [facility] OR;  Service: Vascular;  Laterality: Left;   IR FLUORO GUIDE CV LINE RIGHT  06/04/2021   IR REMOVAL TUN CV CATH W/O FL  06/04/2021   IR US GUIDE VASC ACCESS RIGHT  06/04/2021   PERIPHERAL VASCULAR BALLOON ANGIOPLASTY  09/09/2021   Procedure: PERIPHERAL VASCULAR BALLOON ANGIOPLASTY;  Surgeon: Cherre Robins, MD;  Location: [facility];  Service: Cardiovascular;;   Patient Active Problem List   Diagnosis Date Noted   Chronic kidney disease 11/11/2021   Hypertension 11/11/2021   Hemodialysis catheter dysfunction ([place])  06/11/2021   Pressure injury of skin 06/10/2021   Bradycardia 06/09/2021   Paroxysmal atrial fibrillation ([place]) 06/09/2021   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 ([place]) 00/92/3300   Complication of vascular dialysis catheter, initial encounter    Hyperkalemia    Unspecified protein-calorie malnutrition ([place]) 05/18/2021   Allergy, unspecified, initial encounter 03/12/2021   Anaphylactic shock, unspecified, initial encounter 03/12/2021   Anemia in chronic kidney disease 03/12/2021   Encounter for screening for respiratory tuberculosis 03/12/2021   Iron deficiency anemia, unspecified 03/12/2021   Other specified coagulation defects ([place]) 03/12/2021   Secondary hyperparathyroidism of renal origin ([place]) 03/12/2021   Shortness of breath 03/12/2021   Type 2 diabetes mellitus with diabetic peripheral angiopathy without gangrene ([place]) 03/12/2021   Type 2 diabetes mellitus with other diabetic kidney complication ([place]) 76/22/6333   Acute metabolic encephalopathy 54/56/2563   Acute respiratory failure with hypoxia ([place]) 01/07/2021   Bacteremia due to other bacteria 01/07/2021   Pressure injury of right hip, unstageable ([place]) 01/07/2021   Septic shock ([place]) 12/27/2020   ESRD with anemia ([place]) 11/28/2020   Melena 10/15/2020   History of drug abuse ([place]) 01/07/2020   Tobacco abuse 01/07/2020   Muscle cramps 04/30/2012   Benign prostatic hyperplasia with urinary retention 06/01/2011   Acute renal  failure ([place]) 05/29/2011   Hepatitis C 05/29/2011   Heroin withdrawal ([place]) 05/29/2011    REFERRING DIAG: Mobility - indication deconditioning in ESRD patient  THERAPY DIAG:  Muscle weakness (generalized)  Other abnormalities of gait and mobility  Rationale for Evaluation and Treatment Rehabilitation  PERTINENT HISTORY: ESRD on hemodialysis, AV fistula placement,  PRECAUTIONS: Fall   SUBJECTIVE: Patient reports he is doing well, feeling about the same.   PAIN:  Are you having pain? Yes:  NPRS scale:  4/10 Pain location: right hip, left shoulder, right wrist, left knee Pain description: chronic, intermittent, aching Aggravating factors: Standing, pushing from armrest of chair, reaching overhead Relieving factors: Rest  PATIENT GOALS: Patient states he wants to be able to stand and walk on his own.   OBJECTIVE: (objective measures completed at initial evaluation unless otherwise dated) MUSCLE LENGTH: Significant limitation of hamstring muscle length   POSTURE: rounded shoulders, forward head, and decreased lumbar lordosis   LOWER EXTREMITY ROM: Limitation in bilateral knee extension, lacking approx 35-40 deg bilaterally  11/02/2021: lacking approx 22-25 deg  12/28/2021: lacking approx 20 deg  01/20/2022: lacking approx 20 deg   LOWER EXTREMITY MMT:   MMT Right eval Left eval R/L Rt / Lt 11/02/2021 Rt / Lt 12/28/2021  Hip flexion 4- 4- 4/4- 4- / 4- 4- / 4-  Hip extension 3- 3-  3- / 3- 3 / 3  Hip abduction 3- 3-  3 / 3 3 / 3  Knee flexion 4 4 4+/4+ 4+ / 4+ 4+ / 4+  Knee extension 4 4 4+/4+ 4+ / 4+ 4+ / 4+    FUNCTIONAL TESTS:  Sit to stand: patient requires significant use of BUE for support for sit to stand and controlled of decent 09/30/2021: patient still needs UE assist standing from standard chair, able to perform STS from elevated table without UE support 11/02/2021: patient still needs UE assist standing from standard chair, able to perform STS from elevated table without UE support 12/28/2021: patient still needs UE assist standing from standard chair, able to perform STS from elevated table without UE support 01/20/2022: patient able to stand from standard chair without UE assist 6 MWT: 595 ft, multiple standing rest breaks due to right wrist discomfort - 11/09/2021   GAIT: - 12/28/2021 Distance walked: 20 ft Assistive device utilized: Small based quad cane Level of assistance: Mod I Comments: patient remains in knee flexion and hip flexion throughout, improved heel  toe progression, difficulty coordinating cane with stepping     TODAY'S TREATMENT: [facility] Adult PT Treatment:                                                DATE: 01/20/2022 Therapeutic Exercise: NuStep L8 x 8 min with UE/LE to improve strength, endurance, workload capacity, while taking subjective Seated hamstring stretch with foot on stool 3 x 30 sec each with pressure over knee into extension Slant board calf stretch 3 x 30 sec Sit to stand without UE support 2 x 6 Row with blue 2 x 20 Knee extension machine 25# 3 x 15 Side stepping with green at knees in // bars 2 x 3 lengths down/back Manual: Passive hamstring and hip flexor stretching Passive knee extension, hip extension and abduction   [facility] Adult PT Treatment:  DATE: 01/13/2022 Therapeutic Exercise: NuStep L8 x 8 min with UE/LE to improve strength, endurance, workload capacity, while taking subjective Slant board calf stretch 3 x 30 sec Seated hamstring stretch with foot on stool 3 x 30 sec each with pressure over knee into extension Sit to stand from slightly elevated table without UE support 2 x 10 Knee extension machine 20# 3 x 15 Side stepping with green at knees in // bars 2 x 3 lengths down/back Forward marching in // bars x 1 length down back Manual: Passive hamstring and hip flexor stretching Passive knee extension, hip extension and abduction  [facility] Adult PT Treatment:                                                DATE: 12/28/2021 Therapeutic Exercise: NuStep L8 x 8 min with UE/LE to improve strength, endurance, workload capacity, while taking subjective Seated hamstring stretch with foot on stool 3 x 30 sec each with pressure over knee into extension Sit to stand from slightly elevated table without UE support x 10, holding 10# KB at chest x 10 Knee extension machine 20# 3 x 15 Standing heel raises 2 x 20 Standing hip abduction with green at knees in // bars 2 x 2  lengths down/back Forward marching in // bars x 1 length down back Gait Training: SPC in left and using bilat walking sticks, patient required cueing for proper coordination with cane Walking in // bars with single UE support    PATIENT EDUCATION:  Education details: HEP Person educated: Patient Education method: Consulting civil engineer, Media planner, Corporate treasurer cues, Verbal cues Education comprehension: verbalized understanding, returned demonstration, verbal cues required, tactile cues required, and needs further education   HOME EXERCISE PROGRAM: Access Code: [email or web address]     ASSESSMENT: CLINICAL IMPRESSION: Patient tolerated therapy well with no adverse effects. Therapy continues to focus on stretching and mobility to allow for improved upright posture, and progressing strength and standing/walking tolerance. He does demonstrate improvement in ability to stand from a standard chair but continues to exhibit gross strength and mobility deficits. No changes to HEP this visit. Patient would benefit from continued skilled PT to progress strength and mobility in order to improving walking and maximize functional ability.     OBJECTIVE IMPAIRMENTS Abnormal gait, decreased activity tolerance, decreased balance, decreased mobility, difficulty walking, decreased ROM, decreased strength, impaired flexibility, postural dysfunction, and pain.    ACTIVITY LIMITATIONS standing, squatting, stairs, transfers, and locomotion level   PARTICIPATION LIMITATIONS: meal prep, cleaning, laundry, driving, shopping, community activity, occupation, and yard work   PERSONAL FACTORS Age, Fitness, Past/current experiences, Time since onset of injury/illness/exacerbation, Transportation, and 1 comorbidity: ESRD on hemodialysis  are also affecting patient's functional outcome.      GOALS: Goals reviewed with patient? Yes   SHORT TERM GOALS: Target date: 01/25/2022   Patient will be I with initial HEP in order to progress with  therapy. Baseline: HEP provided at eval 09/30/2021: patient independent with initial HEP Goal status: MET   2.  Patient be able to perform sit to stand without UE support to indicate improved strength and functional mobility. Baseline: patient requires use of BUE 09/30/2021: patient able to perform STS from elevated table without UE support 11/02/2021: patient able to perform STS from elevated table without UE support 12/28/2021: patient able to perform STS from elevated table without UE support  01/20/2022: patient able to stand from standard chair without UE assist Goal status: MET   3.  Patient will be able to stand >/= 1 min without UE support in order to improve balance and ability to perform ADLs Baseline: patient unable to stand without UE support 09/30/2021: patient able to stand 1 min without UE support Goal status: MET   LONG TERM GOALS: Target date: 01/25/2022   Patient will be I with final HEP to maintain progress from PT. Baseline: HEP provided at eval 11/02/2021: progress HEP 12/28/2021: progressing HEP 01/20/2022: progressing Goal status: PARTIALLY MET   2.  Patient will be able to ambulate at mod I level using RW for 41f in order to improve independence with household ambulation. Baseline: Patient unable to ambulate 8/1: able to ambulate with rollator 11/02/2021: patient able to ambulate 10+ ft with rollator 01/20/2022: patient able to ambulate with rollator Goal status: MET   3.  Patient will exhibit bilateral knee extension lacking </= 20 deg in order to improve gait and standing ability Baseline: lacking approx 35-40 deg 8/1: 23 11/02/2021: lacking approx 22-25 deg 12/28/2021: lacking approx 20 deg Goal status: MET   4.  Patient will exhibit hip strength >/= 4-/5 MMT and knee strength >/= 4+/5 MMT in order to improve walking and standing tolerance. Baseline: patient exhibits deficits in hip and knee strength 8/1: see chart 11/02/2021: continues to exhibit gross  strength deficits 12/28/2021: continues to exhibit gross strength deficits 01/20/2022: continues to exhibit gross strength deficits Goal status: PARTIALLY MET  5.  Patient will ambulate household distance using SPC to improve mobility. Baseline: patient able to ambulate 20 ft using SPC Goal status: INITIAL     PLAN: PT FREQUENCY: 1x/week   PT DURATION: 4 weeks   PLANNED INTERVENTIONS: Therapeutic exercises, Therapeutic activity, Neuromuscular re-education, Balance training, Gait training, Patient/Family education, Joint mobilization, Stair training, Aquatic Therapy, Cryotherapy, Moist heat, Manual therapy, and Re-evaluation   PLAN FOR NEXT SESSION: Review HEP and progress PRN, standing and gait training in // bars, gait training with quad cane, gross LE strengthening, knee and hip extension mobility and stretching    CHilda Blades PT, DPT, LAT, ATC 01/20/22  12:01 PM Phone: [phone]: [phone]

## 2022-01-20 ENCOUNTER — Ambulatory Visit: Payer: Medicare HMO | Attending: Family | Admitting: Physical Therapy

## 2022-01-20 ENCOUNTER — Encounter: Payer: Self-pay | Admitting: Physical Therapy

## 2022-01-20 ENCOUNTER — Other Ambulatory Visit: Payer: Self-pay

## 2022-01-20 DIAGNOSIS — R2689 Other abnormalities of gait and mobility: Secondary | ICD-10-CM | POA: Diagnosis present

## 2022-01-20 DIAGNOSIS — M6281 Muscle weakness (generalized): Secondary | ICD-10-CM | POA: Insufficient documentation

## 2022-01-26 NOTE — Therapy (Signed)
Progress Note Reporting Period 10/12/2021 to 01/20/2022  See note below for Objective Data and Assessment of Progress/Goals.      Patient Name: [name] MRN: [id number] DOB:1950-04-13, 71 y.o., male Today's Date: 01/26/2022  PCP: Pcp, No   REFERRING PROVIDER: Claudia Desanctis, MD   END OF SESSION:               Past Medical History:  Diagnosis Date   A-fib ([place])    A-fib ([place])    Edema    ESRD (end stage renal disease) ([place])    MWF Emilie Rutter   History of insertion of tunneled central venous catheter (CVC) with port    Hypotension    Shortness of breath 03/12/2021   Past Surgical History:  Procedure Laterality Date   A/V FISTULAGRAM Left 09/09/2021   Procedure: A/V Fistulagram;  Surgeon: Cherre Robins, MD;  Location: [facility];  Service: Cardiovascular;  Laterality: Left;   AV FISTULA PLACEMENT Left 06/29/2021   Procedure: LEFT RADIOCEPHALIC ARTERIOVENOUS (AV) FISTULA CREATION;  Surgeon: Angelia Mould, MD;  Location: [facility];  Service: Vascular;  Laterality: Left;   IR FLUORO GUIDE CV LINE RIGHT  06/04/2021   IR REMOVAL TUN CV CATH W/O FL  06/04/2021   IR US GUIDE VASC ACCESS RIGHT  06/04/2021   PERIPHERAL VASCULAR BALLOON ANGIOPLASTY  09/09/2021   Procedure: PERIPHERAL VASCULAR BALLOON ANGIOPLASTY;  Surgeon: Cherre Robins, MD;  Location: [facility];  Service: Cardiovascular;;   Patient Active Problem List   Diagnosis Date Noted   Chronic kidney disease 11/11/2021   Hypertension 11/11/2021   Hemodialysis catheter dysfunction ([place]) 06/11/2021   Pressure injury of skin 06/10/2021   Bradycardia 06/09/2021   Paroxysmal atrial fibrillation ([place]) 06/09/2021   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 ([place]) 46/80/3212   Complication of vascular dialysis catheter, initial encounter    Hyperkalemia    Unspecified protein-calorie malnutrition ([place]) 05/18/2021   Allergy, unspecified, initial encounter 03/12/2021   Anaphylactic shock, unspecified,  initial encounter 03/12/2021   Anemia in chronic kidney disease 03/12/2021   Encounter for screening for respiratory tuberculosis 03/12/2021   Iron deficiency anemia, unspecified 03/12/2021   Other specified coagulation defects ([place]) 03/12/2021   Secondary hyperparathyroidism of renal origin ([place]) 03/12/2021   Shortness of breath 03/12/2021   Type 2 diabetes mellitus with diabetic peripheral angiopathy without gangrene ([place]) 03/12/2021   Type 2 diabetes mellitus with other diabetic kidney complication ([place]) 24/82/5003   Acute metabolic encephalopathy 70/48/8891   Acute respiratory failure with hypoxia ([place]) 01/07/2021   Bacteremia due to other bacteria 01/07/2021   Pressure injury of right hip, unstageable ([place]) 01/07/2021   Septic shock ([place]) 12/27/2020   ESRD with anemia ([place]) 11/28/2020   [place] 10/15/2020   History of drug abuse ([place]) 01/07/2020   Tobacco abuse 01/07/2020   Muscle cramps 04/30/2012   Benign prostatic hyperplasia with urinary retention 06/01/2011   Acute renal failure ([place]) 05/29/2011   Hepatitis C 05/29/2011   Heroin withdrawal ([place]) 05/29/2011    REFERRING DIAG: Mobility - indication deconditioning in ESRD patient  THERAPY DIAG:  No diagnosis found.  Rationale for Evaluation and Treatment Rehabilitation  PERTINENT HISTORY: ESRD on hemodialysis, AV fistula placement,  PRECAUTIONS: Fall   SUBJECTIVE: Patient reports he is doing well, feeling about the same.   PAIN:  Are you having pain? Yes:  NPRS scale: 4/10 Pain location: right hip, left shoulder, right wrist, left knee Pain description: chronic, intermittent, aching Aggravating factors: Standing, pushing  from armrest of chair, reaching overhead Relieving factors: Rest  PATIENT GOALS: Patient states he wants to be able to stand and walk on his own.   OBJECTIVE: (objective measures completed at initial evaluation unless otherwise dated) MUSCLE LENGTH: Significant limitation of hamstring muscle  length   POSTURE: rounded shoulders, forward head, and decreased lumbar lordosis   LOWER EXTREMITY ROM: Limitation in bilateral knee extension, lacking approx 35-40 deg bilaterally  11/02/2021: lacking approx 22-25 deg  12/28/2021: lacking approx 20 deg  01/20/2022: lacking approx 20 deg   LOWER EXTREMITY MMT:   MMT Right eval Left eval R/L Rt / Lt 11/02/2021 Rt / Lt 12/28/2021  Hip flexion 4- 4- 4/4- 4- / 4- 4- / 4-  Hip extension 3- 3-  3- / 3- 3 / 3  Hip abduction 3- 3-  3 / 3 3 / 3  Knee flexion 4 4 4+/4+ 4+ / 4+ 4+ / 4+  Knee extension 4 4 4+/4+ 4+ / 4+ 4+ / 4+    FUNCTIONAL TESTS:  Sit to stand: patient requires significant use of BUE for support for sit to stand and controlled of decent 09/30/2021: patient still needs UE assist standing from standard chair, able to perform STS from elevated table without UE support 11/02/2021: patient still needs UE assist standing from standard chair, able to perform STS from elevated table without UE support 12/28/2021: patient still needs UE assist standing from standard chair, able to perform STS from elevated table without UE support 01/20/2022: patient able to stand from standard chair without UE assist 6 MWT: 595 ft, multiple standing rest breaks due to right wrist discomfort - 11/09/2021   GAIT: - 12/28/2021 Distance walked: 20 ft Assistive device utilized: Small based quad cane Level of assistance: Mod I Comments: patient remains in knee flexion and hip flexion throughout, improved heel toe progression, difficulty coordinating cane with stepping     TODAY'S TREATMENT: [facility] Adult PT Treatment:                                                DATE: 01/27/2022 Therapeutic Exercise: NuStep L8 x 8 min with UE/LE to improve strength, endurance, workload capacity, while taking subjective Seated hamstring stretch with foot on stool 3 x 30 sec each with pressure over knee into extension Slant board calf stretch 3 x 30 sec Sit to stand without  UE support 2 x 6 Row with blue 2 x 20 Knee extension machine 25# 3 x 15 Side stepping with green at knees in // bars 2 x 3 lengths down/back Manual: Passive hamstring and hip flexor stretching Passive knee extension, hip extension and abduction   [facility] Adult PT Treatment:                                                DATE: 01/20/2022 Therapeutic Exercise: NuStep L8 x 8 min with UE/LE to improve strength, endurance, workload capacity, while taking subjective Seated hamstring stretch with foot on stool 3 x 30 sec each with pressure over knee into extension Slant board calf stretch 3 x 30 sec Sit to stand without UE support 2 x 6 Row with blue 2 x 20 Knee extension machine 25# 3 x 15 Side stepping  with green at knees in // bars 2 x 3 lengths down/back Manual: Passive hamstring and hip flexor stretching Passive knee extension, hip extension and abduction  [facility] Adult PT Treatment:                                                DATE: 01/13/2022 Therapeutic Exercise: NuStep L8 x 8 min with UE/LE to improve strength, endurance, workload capacity, while taking subjective Slant board calf stretch 3 x 30 sec Seated hamstring stretch with foot on stool 3 x 30 sec each with pressure over knee into extension Sit to stand from slightly elevated table without UE support 2 x 10 Knee extension machine 20# 3 x 15 Side stepping with green at knees in // bars 2 x 3 lengths down/back Forward marching in // bars x 1 length down back Manual: Passive hamstring and hip flexor stretching Passive knee extension, hip extension and abduction   PATIENT EDUCATION:  Education details: HEP Person educated: Patient Education method: Consulting civil engineer, Demonstration, Corporate treasurer cues, Verbal cues Education comprehension: verbalized understanding, returned demonstration, verbal cues required, tactile cues required, and needs further education   HOME EXERCISE PROGRAM: Access Code: [email or web address]     ASSESSMENT: CLINICAL  IMPRESSION: Patient tolerated therapy well with no adverse effects. *** Patient would benefit from continued skilled PT to progress strength and mobility in order to improving walking and maximize functional ability.  Therapy continues to focus on stretching and mobility to allow for improved upright posture, and progressing strength and standing/walking tolerance. He does demonstrate improvement in ability to stand from a standard chair but continues to exhibit gross strength and mobility deficits. No changes to HEP this visit.      OBJECTIVE IMPAIRMENTS Abnormal gait, decreased activity tolerance, decreased balance, decreased mobility, difficulty walking, decreased ROM, decreased strength, impaired flexibility, postural dysfunction, and pain.    ACTIVITY LIMITATIONS standing, squatting, stairs, transfers, and locomotion level   PARTICIPATION LIMITATIONS: meal prep, cleaning, laundry, driving, shopping, community activity, occupation, and yard work   PERSONAL FACTORS Age, Fitness, Past/current experiences, Time since onset of injury/illness/exacerbation, Transportation, and 1 comorbidity: ESRD on hemodialysis  are also affecting patient's functional outcome.      GOALS: Goals reviewed with patient? Yes   SHORT TERM GOALS: Target date: 01/25/2022   Patient will be I with initial HEP in order to progress with therapy. Baseline: HEP provided at eval 09/30/2021: patient independent with initial HEP Goal status: MET   2.  Patient be able to perform sit to stand without UE support to indicate improved strength and functional mobility. Baseline: patient requires use of BUE 09/30/2021: patient able to perform STS from elevated table without UE support 11/02/2021: patient able to perform STS from elevated table without UE support 12/28/2021: patient able to perform STS from elevated table without UE support 01/20/2022: patient able to stand from standard chair without UE assist Goal status: MET    3.  Patient will be able to stand >/= 1 min without UE support in order to improve balance and ability to perform ADLs Baseline: patient unable to stand without UE support 09/30/2021: patient able to stand 1 min without UE support Goal status: MET   LONG TERM GOALS: Target date: 01/25/2022   Patient will be I with final HEP to maintain progress from PT. Baseline: HEP provided at eval 11/02/2021: progress HEP 12/28/2021: progressing  HEP 01/20/2022: progressing Goal status: PARTIALLY MET   2.  Patient will be able to ambulate at mod I level using RW for 49f in order to improve independence with household ambulation. Baseline: Patient unable to ambulate 8/1: able to ambulate with rollator 11/02/2021: patient able to ambulate 10+ ft with rollator 01/20/2022: patient able to ambulate with rollator Goal status: MET   3.  Patient will exhibit bilateral knee extension lacking </= 20 deg in order to improve gait and standing ability Baseline: lacking approx 35-40 deg 8/1: 23 11/02/2021: lacking approx 22-25 deg 12/28/2021: lacking approx 20 deg Goal status: MET   4.  Patient will exhibit hip strength >/= 4-/5 MMT and knee strength >/= 4+/5 MMT in order to improve walking and standing tolerance. Baseline: patient exhibits deficits in hip and knee strength 8/1: see chart 11/02/2021: continues to exhibit gross strength deficits 12/28/2021: continues to exhibit gross strength deficits 01/20/2022: continues to exhibit gross strength deficits Goal status: PARTIALLY MET  5.  Patient will ambulate household distance using SPC to improve mobility. Baseline: patient able to ambulate 20 ft using SPC Goal status: INITIAL     PLAN: PT FREQUENCY: 1x/week   PT DURATION: 4 weeks   PLANNED INTERVENTIONS: Therapeutic exercises, Therapeutic activity, Neuromuscular re-education, Balance training, Gait training, Patient/Family education, Joint mobilization, Stair training, Aquatic Therapy, Cryotherapy,  Moist heat, Manual therapy, and Re-evaluation   PLAN FOR NEXT SESSION: Review HEP and progress PRN, standing and gait training in // bars, gait training with quad cane, gross LE strengthening, knee and hip extension mobility and stretching    CHilda Blades PT, DPT, LAT, ATC 01/26/22  3:01 PM Phone: [phone]: [phone]

## 2022-01-27 ENCOUNTER — Other Ambulatory Visit: Payer: Self-pay

## 2022-01-27 ENCOUNTER — Encounter: Payer: Self-pay | Admitting: Physical Therapy

## 2022-01-27 ENCOUNTER — Ambulatory Visit: Payer: Medicare HMO | Admitting: Physical Therapy

## 2022-01-27 DIAGNOSIS — M6281 Muscle weakness (generalized): Secondary | ICD-10-CM | POA: Diagnosis not present

## 2022-01-27 DIAGNOSIS — R2689 Other abnormalities of gait and mobility: Secondary | ICD-10-CM

## 2023-09-12 IMAGING — DX DG PELVIS 1-2V
2 series · 2 of 2 positions shown · non-contrast
Comparison: None.

CLINICAL DATA: Sacral ulcer. Two views to assess stage III pressure
ulcer of sacrum. [facility]

EXAM:
PELVIS - 1-2 VIEW

[pelvis ap]
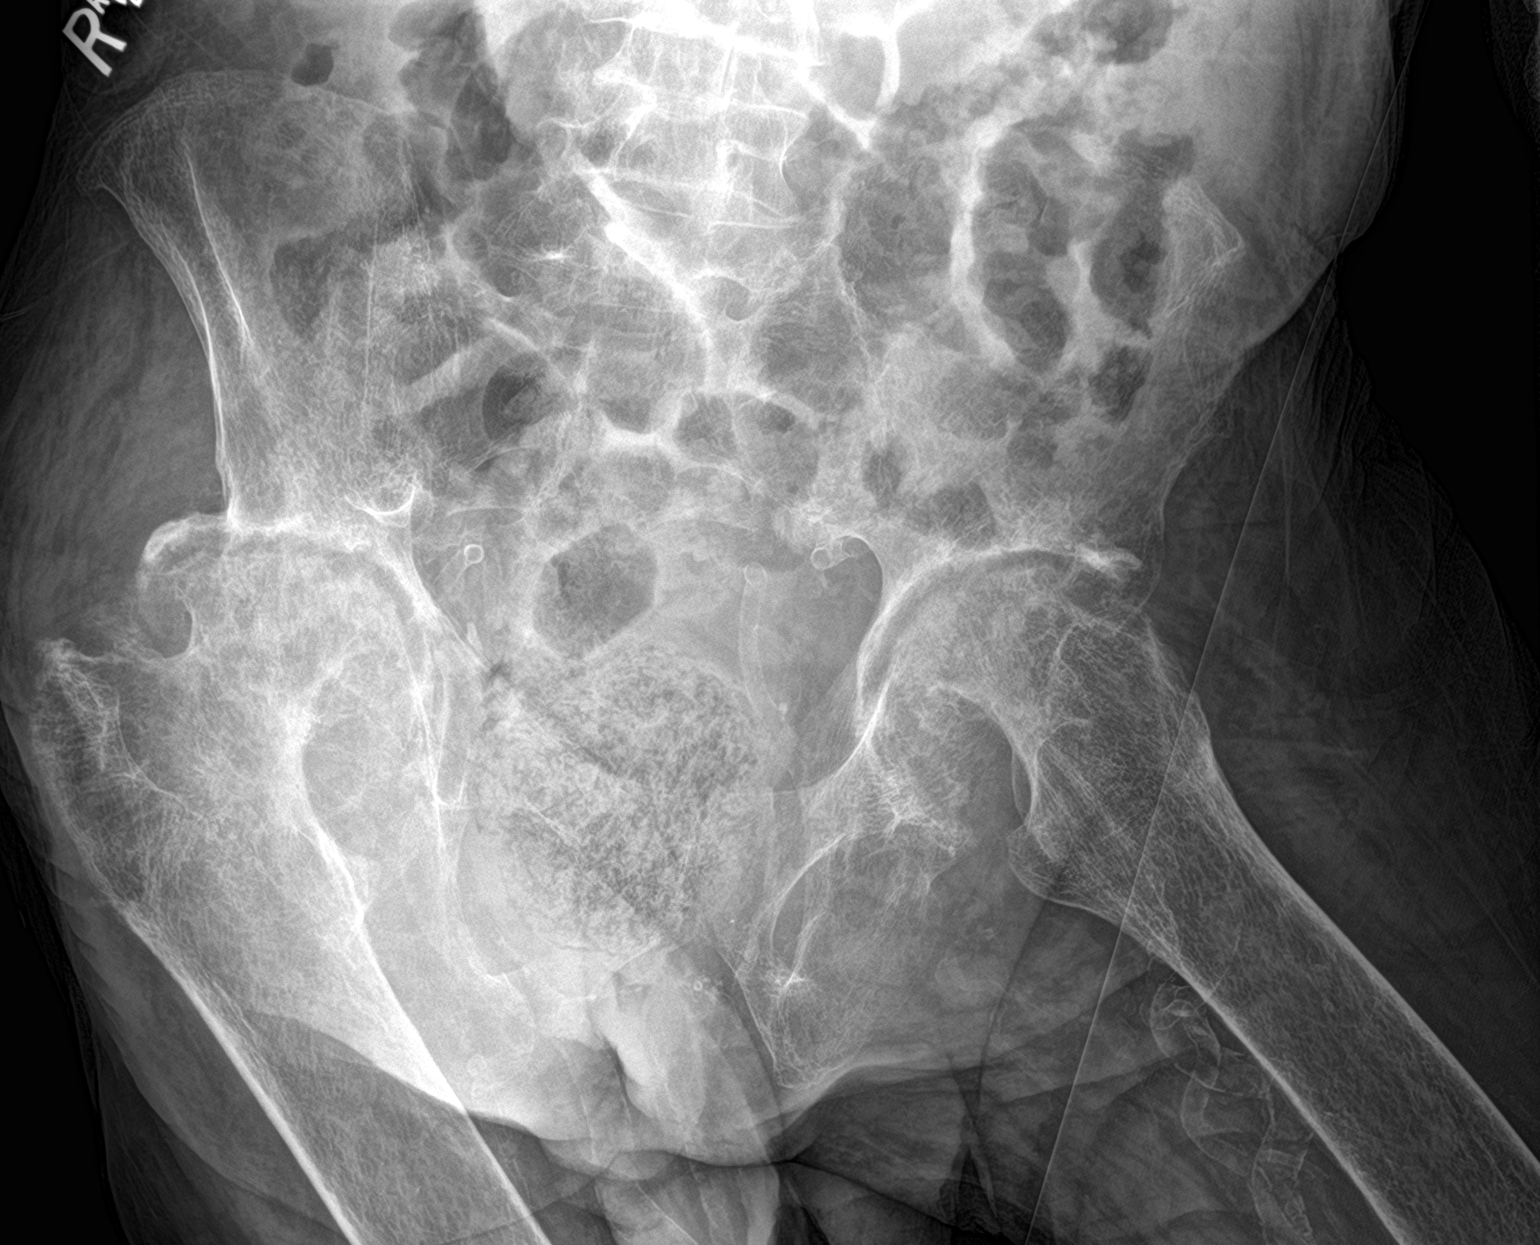

[pelvis obl]
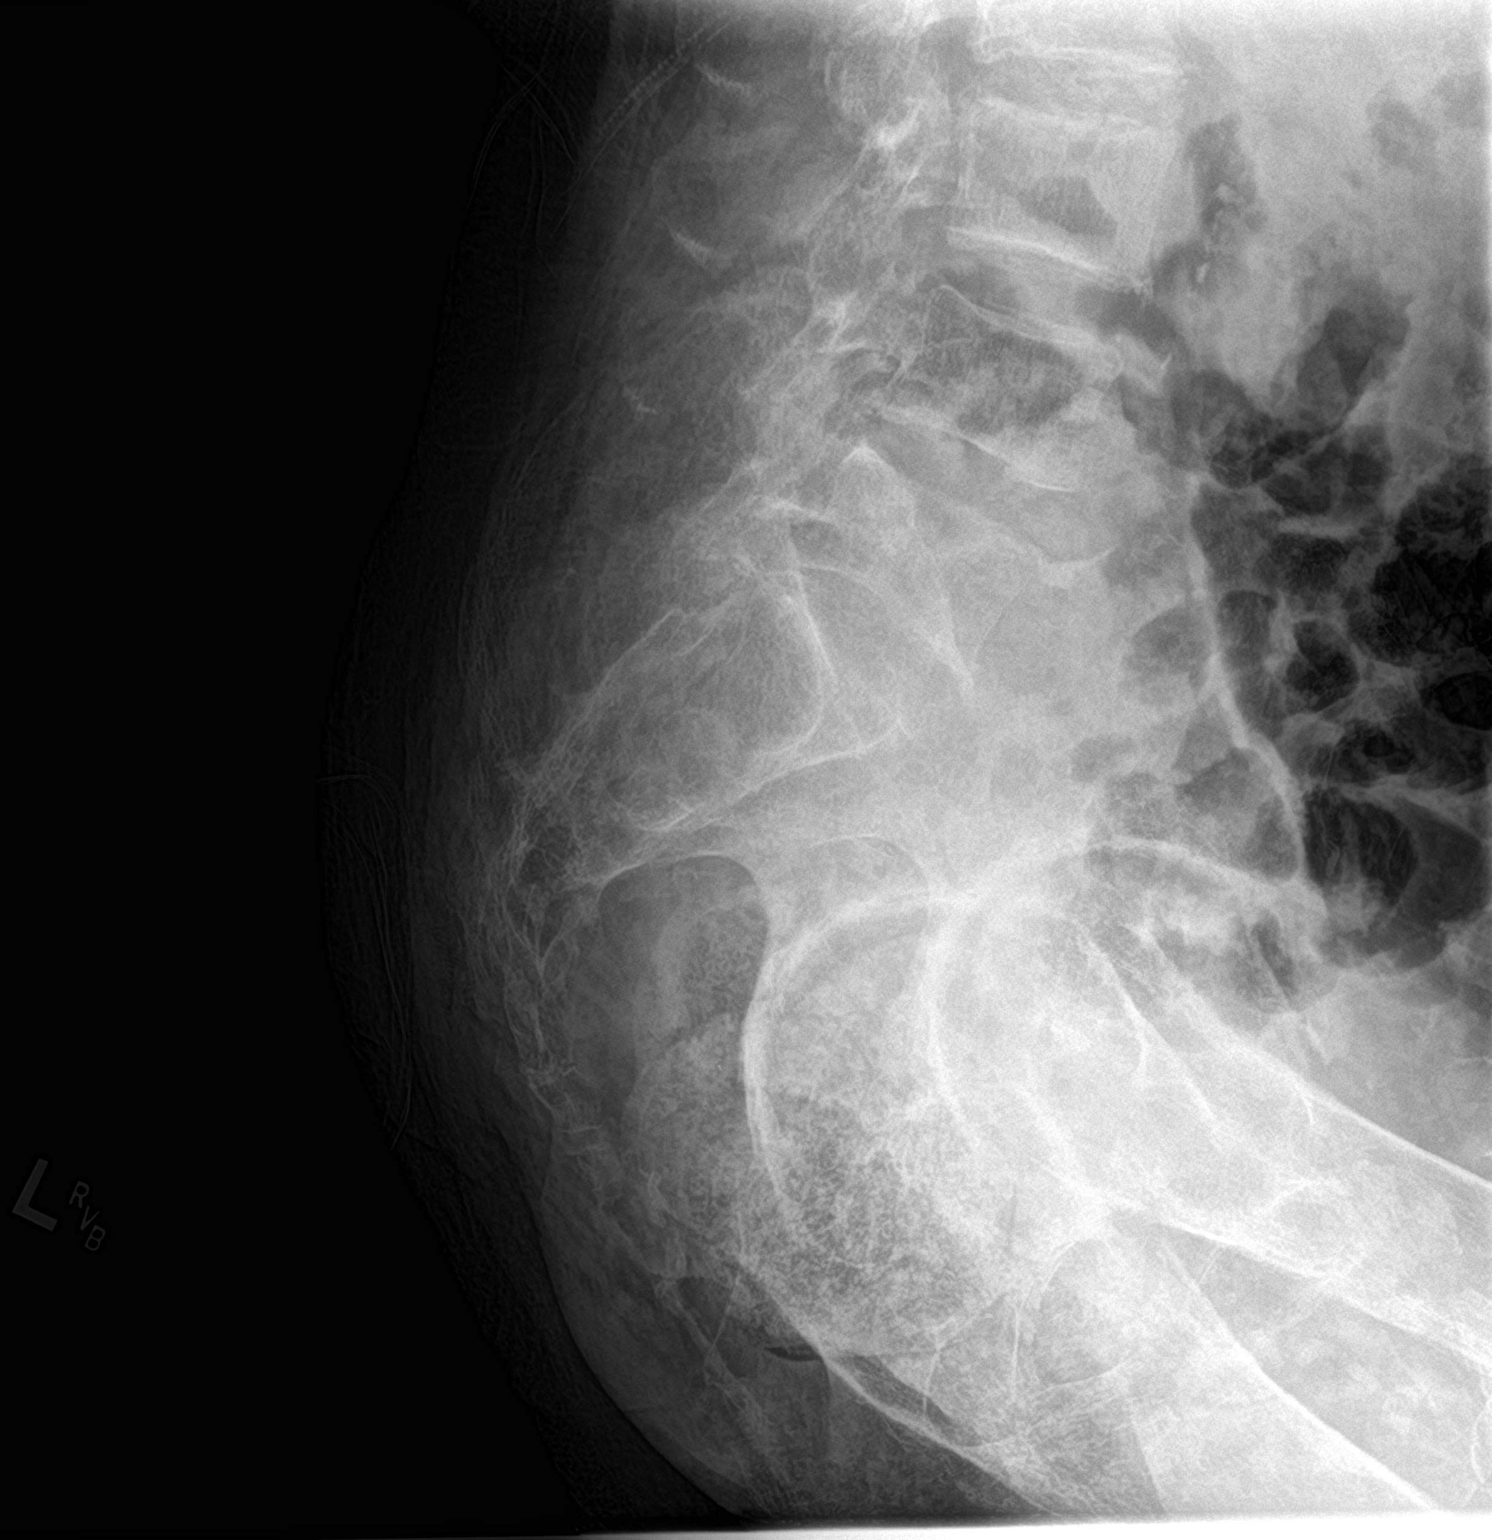

[2 of 2 positions shown; findings below may reference images not displayed]

FINDINGS: There is diffuse decreased bone mineralization. Within this
limitation, there appears to be irregularity of the posterior soft
tissues along the course of the distal sacrum and coccyx. The
decreased bone mineralization limits evaluation for cortical
erosion.

Markedly severe bilateral femoroacetabular osteoarthritis with
bone-on-bone contact and moderate to high-grade bilateral femoral
head and adjacent acetabular cortical erosions.

Dense vascular calcifications are noted.
IMPRESSION: Diffuse decreased bone mineralization. This limits evaluation for
cortical erosion in the sacrum and coccyx, however no definitive
cortical erosion is identified.

## 2023-09-29 IMAGING — DX DG CHEST 1V PORT
1 series · 1 of 1 positions shown · non-contrast
Comparison: None.

CLINICAL DATA: Port placement

EXAM:
PORTABLE CHEST 1 VIEW

[chest ap]
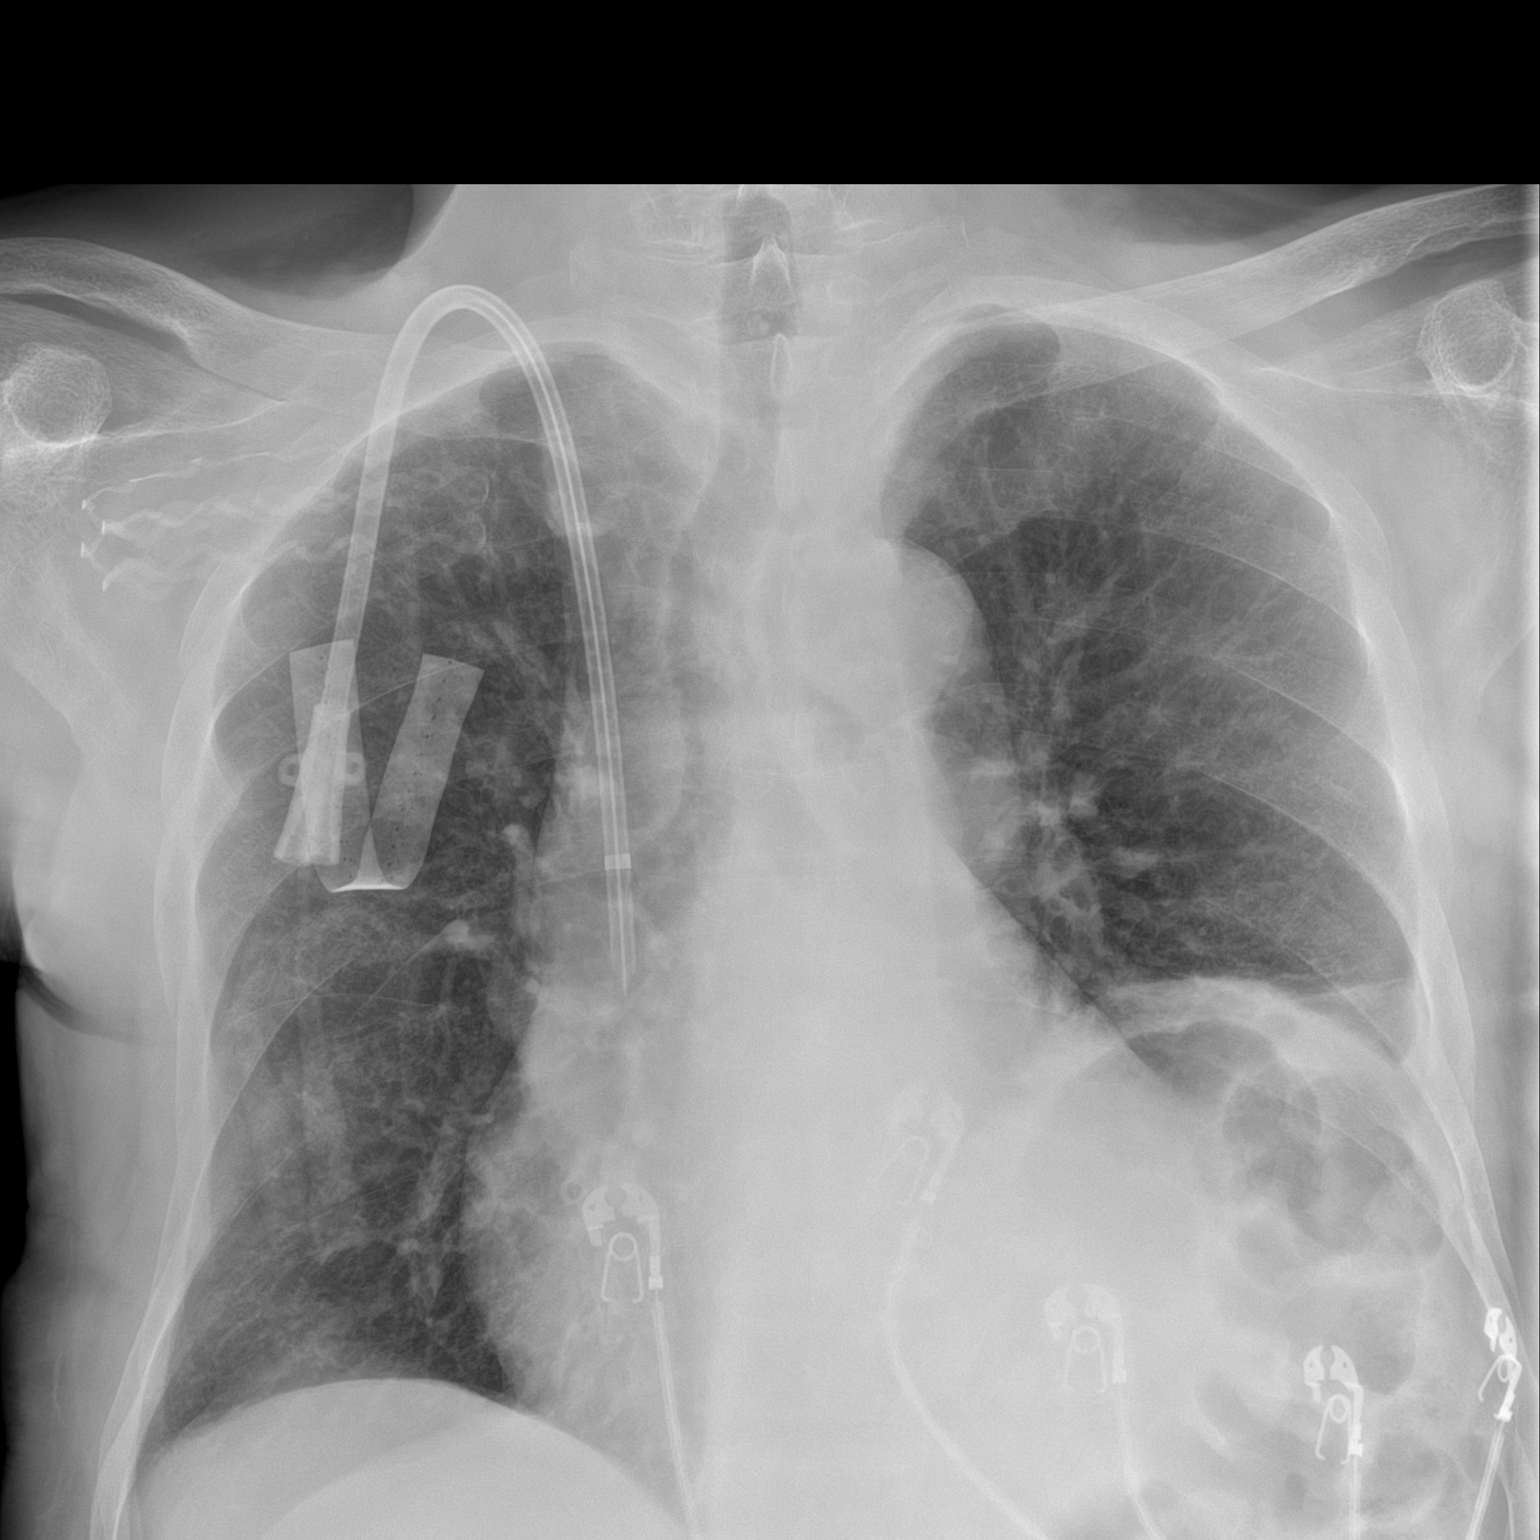

[1 of 1 positions shown; findings below may reference images not displayed]

FINDINGS: Elevation of the left hemidiaphragm. There is a right chest wall
dual lumen catheter with tip at the cavoatrial junction. No focal
airspace consolidation. Left basilar atelectasis.
IMPRESSION: Catheter tip at the cavoatrial junction.
